# Patient Record
Sex: Male | Born: 1955 | Race: White | Hispanic: No | Marital: Single | Smoking: Current every day smoker
Health system: Southern US, Community
[De-identification: ages and names within clinical notes are randomized; demographics above are authoritative.]

## PROBLEM LIST (undated history)

## (undated) DIAGNOSIS — J45909 Unspecified asthma, uncomplicated: Secondary | ICD-10-CM

## (undated) DIAGNOSIS — J449 Chronic obstructive pulmonary disease, unspecified: Secondary | ICD-10-CM

## (undated) DIAGNOSIS — I509 Heart failure, unspecified: Secondary | ICD-10-CM

## (undated) DIAGNOSIS — M1990 Unspecified osteoarthritis, unspecified site: Secondary | ICD-10-CM

## (undated) DIAGNOSIS — I10 Essential (primary) hypertension: Secondary | ICD-10-CM

## (undated) DIAGNOSIS — R0600 Dyspnea, unspecified: Secondary | ICD-10-CM

## (undated) HISTORY — PX: HERNIA REPAIR: SHX51

---

## 2005-04-07 ENCOUNTER — Ambulatory Visit: Payer: Self-pay | Admitting: Internal Medicine

## 2007-10-28 ENCOUNTER — Ambulatory Visit: Payer: Self-pay | Admitting: Internal Medicine

## 2008-09-25 ENCOUNTER — Emergency Department: Payer: Self-pay | Admitting: Unknown Physician Specialty

## 2008-09-28 ENCOUNTER — Emergency Department: Payer: Self-pay | Admitting: Unknown Physician Specialty

## 2008-10-02 ENCOUNTER — Ambulatory Visit: Payer: Self-pay | Admitting: Internal Medicine

## 2016-03-24 ENCOUNTER — Encounter: Payer: Self-pay | Admitting: *Deleted

## 2016-03-24 ENCOUNTER — Emergency Department: Payer: Self-pay

## 2016-03-24 ENCOUNTER — Observation Stay
Admission: EM | Admit: 2016-03-24 | Discharge: 2016-03-26 | Disposition: A | Discharge status: Home / Self Care | Payer: Self-pay | Attending: Internal Medicine | Admitting: Internal Medicine

## 2016-03-24 DIAGNOSIS — I129 Hypertensive chronic kidney disease with stage 1 through stage 4 chronic kidney disease, or unspecified chronic kidney disease: Secondary | ICD-10-CM | POA: Insufficient documentation

## 2016-03-24 DIAGNOSIS — Z79899 Other long term (current) drug therapy: Secondary | ICD-10-CM | POA: Insufficient documentation

## 2016-03-24 DIAGNOSIS — A039 Shigellosis, unspecified: Principal | ICD-10-CM | POA: Insufficient documentation

## 2016-03-24 DIAGNOSIS — N183 Chronic kidney disease, stage 3 (moderate): Secondary | ICD-10-CM | POA: Insufficient documentation

## 2016-03-24 DIAGNOSIS — E860 Dehydration: Secondary | ICD-10-CM | POA: Diagnosis present

## 2016-03-24 DIAGNOSIS — E876 Hypokalemia: Secondary | ICD-10-CM | POA: Diagnosis present

## 2016-03-24 DIAGNOSIS — J449 Chronic obstructive pulmonary disease, unspecified: Secondary | ICD-10-CM | POA: Diagnosis present

## 2016-03-24 DIAGNOSIS — I10 Essential (primary) hypertension: Secondary | ICD-10-CM | POA: Diagnosis present

## 2016-03-24 DIAGNOSIS — Z7951 Long term (current) use of inhaled steroids: Secondary | ICD-10-CM | POA: Insufficient documentation

## 2016-03-24 DIAGNOSIS — R197 Diarrhea, unspecified: Secondary | ICD-10-CM | POA: Diagnosis present

## 2016-03-24 DIAGNOSIS — R509 Fever, unspecified: Secondary | ICD-10-CM

## 2016-03-24 DIAGNOSIS — E873 Alkalosis: Secondary | ICD-10-CM | POA: Insufficient documentation

## 2016-03-24 DIAGNOSIS — F17200 Nicotine dependence, unspecified, uncomplicated: Secondary | ICD-10-CM | POA: Insufficient documentation

## 2016-03-24 HISTORY — DX: Essential (primary) hypertension: I10

## 2016-03-24 HISTORY — DX: Chronic obstructive pulmonary disease, unspecified: J44.9

## 2016-03-24 HISTORY — DX: Unspecified asthma, uncomplicated: J45.909

## 2016-03-24 LAB — COMPREHENSIVE METABOLIC PANEL
ALT: 12 U/L — AB (ref 17–63)
AST: 22 U/L (ref 15–41)
Albumin: 3.5 g/dL (ref 3.5–5.0)
Alkaline Phosphatase: 117 U/L (ref 38–126)
Anion gap: 8 (ref 5–15)
BUN: 9 mg/dL (ref 6–20)
CHLORIDE: 92 mmol/L — AB (ref 101–111)
CO2: 34 mmol/L — AB (ref 22–32)
CREATININE: 1.58 mg/dL — AB (ref 0.61–1.24)
Calcium: 9.2 mg/dL (ref 8.9–10.3)
GFR calc Af Amer: 54 mL/min — ABNORMAL LOW (ref >60)
GFR, EST NON AFRICAN AMERICAN: 47 mL/min — AB (ref >60)
GLUCOSE: 112 mg/dL — AB (ref 65–99)
Potassium: 2.4 mmol/L — CL (ref 3.5–5.1)
Sodium: 134 mmol/L — ABNORMAL LOW (ref 135–145)
Total Bilirubin: 1 mg/dL (ref 0.3–1.2)
Total Protein: 7.8 g/dL (ref 6.5–8.1)

## 2016-03-24 LAB — CBC
HCT: 45.9 % (ref 40.0–52.0)
Hemoglobin: 15.8 g/dL (ref 13.0–18.0)
MCH: 36.9 pg — AB (ref 26.0–34.0)
MCHC: 34.4 g/dL (ref 32.0–36.0)
MCV: 107.2 fL — AB (ref 80.0–100.0)
PLATELETS: 277 10*3/uL (ref 150–440)
RBC: 4.28 MIL/uL — ABNORMAL LOW (ref 4.40–5.90)
RDW: 14.3 % (ref 11.5–14.5)
WBC: 10.5 10*3/uL (ref 3.8–10.6)

## 2016-03-24 LAB — URINALYSIS COMPLETE WITH MICROSCOPIC (ARMC ONLY)
BILIRUBIN URINE: NEGATIVE
GLUCOSE, UA: NEGATIVE mg/dL
Ketones, ur: NEGATIVE mg/dL
Nitrite: NEGATIVE
Protein, ur: 30 mg/dL — AB
Specific Gravity, Urine: 1.014 (ref 1.005–1.030)
pH: 6 (ref 5.0–8.0)

## 2016-03-24 LAB — LACTIC ACID, PLASMA: LACTIC ACID, VENOUS: 1.2 mmol/L (ref 0.5–1.9)

## 2016-03-24 LAB — LIPASE, BLOOD: LIPASE: 17 U/L (ref 11–51)

## 2016-03-24 MED ORDER — POTASSIUM CHLORIDE IN NACL 20-0.9 MEQ/L-% IV SOLN
Freq: Once | INTRAVENOUS | Status: AC
  Administered 2016-03-24: 23:00:00 via INTRAVENOUS
  Filled 2016-03-24: qty 1000

## 2016-03-24 MED ORDER — ACETAMINOPHEN 325 MG PO TABS
650.0000 mg | ORAL_TABLET | Freq: Once | ORAL | Status: AC | PRN
  Administered 2016-03-24: 650 mg via ORAL

## 2016-03-24 MED ORDER — IBUPROFEN 600 MG PO TABS
ORAL_TABLET | ORAL | Status: AC
  Filled 2016-03-24: qty 1

## 2016-03-24 MED ORDER — METRONIDAZOLE IN NACL 5-0.79 MG/ML-% IV SOLN
500.0000 mg | Freq: Once | INTRAVENOUS | Status: AC
  Administered 2016-03-25: 500 mg via INTRAVENOUS
  Filled 2016-03-24: qty 100

## 2016-03-24 MED ORDER — ONDANSETRON 4 MG PO TBDP
4.0000 mg | ORAL_TABLET | Freq: Once | ORAL | Status: AC | PRN
  Administered 2016-03-24: 4 mg via ORAL

## 2016-03-24 MED ORDER — IPRATROPIUM-ALBUTEROL 0.5-2.5 (3) MG/3ML IN SOLN
3.0000 mL | Freq: Once | RESPIRATORY_TRACT | Status: AC
  Administered 2016-03-24: 3 mL via RESPIRATORY_TRACT
  Filled 2016-03-24: qty 3

## 2016-03-24 MED ORDER — POTASSIUM CHLORIDE CRYS ER 20 MEQ PO TBCR
40.0000 meq | EXTENDED_RELEASE_TABLET | Freq: Once | ORAL | Status: AC
  Administered 2016-03-24: 40 meq via ORAL
  Filled 2016-03-24: qty 2

## 2016-03-24 MED ORDER — IBUPROFEN 600 MG PO TABS
600.0000 mg | ORAL_TABLET | Freq: Once | ORAL | Status: AC
  Administered 2016-03-24: 600 mg via ORAL

## 2016-03-24 MED ORDER — ACETAMINOPHEN 325 MG PO TABS
ORAL_TABLET | ORAL | Status: AC
  Administered 2016-03-24: 650 mg via ORAL
  Filled 2016-03-24: qty 2

## 2016-03-24 MED ORDER — SODIUM CHLORIDE 0.9 % IV BOLUS (SEPSIS)
1000.0000 mL | Freq: Once | INTRAVENOUS | Status: AC
  Administered 2016-03-24: 1000 mL via INTRAVENOUS

## 2016-03-24 MED ORDER — POTASSIUM CHLORIDE 10 MEQ/100ML IV SOLN
10.0000 meq | INTRAVENOUS | Status: AC
Start: 2016-03-25 — End: 2016-03-25
  Administered 2016-03-25 (×2): 10 meq via INTRAVENOUS
  Filled 2016-03-24 (×3): qty 100

## 2016-03-24 MED ORDER — ONDANSETRON 4 MG PO TBDP
ORAL_TABLET | ORAL | Status: AC
  Administered 2016-03-24: 4 mg via ORAL
  Filled 2016-03-24: qty 1

## 2016-03-24 NOTE — ED Provider Notes (Signed)
[facility] Emergency Department Provider Note    First MD Initiated Contact with Patient 03/24/16 2234     (approximate)  I have reviewed the triage vital signs and the nursing notes.   HISTORY  Chief Complaint Diarrhea    HPI Miguel Howell is a 60 y.o. male with history of asthma and COPD presents with several days of diarrhea, nausea fatigue and weakness. Patient states symptoms started on Tuesday. He just recently finished course of amoxicillin status post dental surgery. States that he's having roughly 5 episodes of diarrhea per day. Denies any blood in his stools. Denies any melena. States an episode of epigastric pain yesterday but denies any pain at this time. Denies any history of IVDA. No recent surgeries. Denies any known sick contacts.   Past Medical History:  Diagnosis Date  . Asthma   . COPD (chronic obstructive pulmonary disease) (HCC)   . Hypertension     There are no active problems to display for this patient.   Past Surgical History:  Procedure Laterality Date  . HERNIA REPAIR      Prior to Admission medications   Not on File    Allergies Review of patient's allergies indicates no known allergies.  No fam h/o easy bleeding or bruising  Social History Social History  Substance Use Topics  . Smoking status: Current Some Day Smoker  . Smokeless tobacco: Current User  . Alcohol use Yes    Review of Systems Patient denies headaches, rhinorrhea, blurry vision, numbness, shortness of breath, chest pain, edema, cough, abdominal pain, nausea, vomiting, diarrhea, dysuria, fevers, rashes or hallucinations unless otherwise stated above in HPI. ____________________________________________   PHYSICAL EXAM:  VITAL SIGNS: Vitals:   03/24/16 2136 03/24/16 2238  BP: 131/62 107/66  Pulse: 95 87  Resp: 19 17  Temp: (!) 103 F (39.4 C) (!) 100.8 F (38.2 C)    Constitutional: Alert and oriented. Ill appearing Eyes:  Conjunctivae are normal. PERRL. EOMI. Head: Atraumatic. Nose: No congestion/rhinnorhea. Mouth/Throat: Mucous membranes are dry.  Oropharynx non-erythematous. Neck: No stridor. Painless ROM. No cervical spine tenderness to palpation Hematological/Lymphatic/Immunilogical: No cervical lymphadenopathy. Cardiovascular: Normal rate, regular rhythm. Grossly normal heart sounds.  Good peripheral circulation. Respiratory: Normal respiratory effort.  No retractions. Lungs coarse diffuse wheezing throughout Gastrointestinal: Soft and nontender. No distention. No abdominal bruits. No CVA tenderness. Musculoskeletal: No lower extremity tenderness nor edema.  No joint effusions. Neurologic:  Normal speech and language. No gross focal neurologic deficits are appreciated. No gait instability. Skin:  Skin is warm, dry and intact. No rash noted. Psychiatric: Mood and affect are normal. Speech and behavior are normal.  ____________________________________________   LABS (all labs ordered are listed, but only abnormal results are displayed)  Results for orders placed or performed during the hospital encounter of 03/24/16 (from the past 24 hour(s))  Lipase, blood     Status: None   Collection Time: 03/24/16  8:25 PM  Result Value Ref Range   Lipase 17 11 - 51 U/L  Comprehensive metabolic panel     Status: Abnormal   Collection Time: 03/24/16  8:25 PM  Result Value Ref Range   Sodium 134 (L) 135 - 145 mmol/L   Potassium 2.4 (LL) 3.5 - 5.1 mmol/L   Chloride 92 (L) 101 - 111 mmol/L   CO2 34 (H) 22 - 32 mmol/L   Glucose, Bld 112 (H) 65 - 99 mg/dL   BUN 9 6 - 20 mg/dL   Creatinine, Ser 1.61 (  H) 0.61 - 1.24 mg/dL   Calcium 9.2 8.9 - 40.110.3 mg/dL   Total Protein 7.8 6.5 - 8.1 g/dL   Albumin 3.5 3.5 - 5.0 g/dL   AST 22 15 - 41 U/L   ALT 12 (L) 17 - 63 U/L   Alkaline Phosphatase 117 38 - 126 U/L   Total Bilirubin 1.0 0.3 - 1.2 mg/dL   GFR calc non Af Amer 47 (L) >60 mL/min   GFR calc Af Amer 54 (L) >60  mL/min   Anion gap 8 5 - 15  CBC     Status: Abnormal   Collection Time: 03/24/16  8:25 PM  Result Value Ref Range   WBC 10.5 3.8 - 10.6 K/uL   RBC 4.28 (L) 4.40 - 5.90 MIL/uL   Hemoglobin 15.8 13.0 - 18.0 g/dL   HCT 02.745.9 25.340.0 - 66.452.0 %   MCV 107.2 (H) 80.0 - 100.0 fL   MCH 36.9 (H) 26.0 - 34.0 pg   MCHC 34.4 32.0 - 36.0 g/dL   RDW 40.314.3 47.411.5 - 25.914.5 %   Platelets 277 150 - 440 K/uL  Urinalysis complete, with microscopic     Status: Abnormal   Collection Time: 03/24/16  8:25 PM  Result Value Ref Range   Color, Urine YELLOW (A) YELLOW   APPearance CLEAR (A) CLEAR   Glucose, UA NEGATIVE NEGATIVE mg/dL   Bilirubin Urine NEGATIVE NEGATIVE   Ketones, ur NEGATIVE NEGATIVE mg/dL   Specific Gravity, Urine 1.014 1.005 - 1.030   Hgb urine dipstick 1+ (A) NEGATIVE   pH 6.0 5.0 - 8.0   Protein, ur 30 (A) NEGATIVE mg/dL   Nitrite NEGATIVE NEGATIVE   Leukocytes, UA TRACE (A) NEGATIVE   RBC / HPF 0-5 0 - 5 RBC/hpf   WBC, UA 0-5 0 - 5 WBC/hpf   Bacteria, UA RARE (A) NONE SEEN   Squamous Epithelial / LPF 0-5 (A) NONE SEEN   Mucous PRESENT   Lactic acid, plasma     Status: None   Collection Time: 03/24/16 10:28 PM  Result Value Ref Range   Lactic Acid, Venous 1.2 0.5 - 1.9 mmol/L   ____________________________________________   ____________________________________________  RADIOLOGY  I personally reviewed all radiographic images ordered to evaluate for the above acute complaints and reviewed radiology reports and findings.  These findings were personally discussed with the patient.  Please see medical record for radiology report.  ____________________________________________   PROCEDURES  Procedure(s) performed: none    Critical Care performed: no ____________________________________________   INITIAL IMPRESSION / ASSESSMENT AND PLAN / ED COURSE  Pertinent labs & imaging results that were available during my care of the patient were reviewed by me and considered in my medical  decision making (see chart for details).  DDX: flu, infectious diarrhea, pna, dehydration  Miguel Howell is a 60 y.o. who presents to the ED with Several days of flulike illness with watery diarrhea. Patient does appear acutely ill with fever and tachycardia. Also with evidence of COPD exacerbation with mild tachypnea and diffuse wheezing. Chest x-ray without evidence of focal pneumonia. Patient does have evidence of hypokalemia and metabolic alkalosis likely secondary to GI losses. His abdominal exam is soft and benign. Do not feel CT imaging clinically indicated at this time. Potassium is repleted with IV and oral potassium.  The patient will be placed on continuous pulse oximetry and telemetry for monitoring.  Laboratory evaluation will be sent to evaluate for the above complaints.   Stool studies and infectious workup  ordered. Based on his hypokalemia and tachycardia with GI losses do feel patient will require admission for IV resuscitation. We'll hold off on antibiotics until culture data are turns as his presentation is likely viral in etiology.  Have discussed with the patient and available family all diagnostics and treatments performed thus far and all questions were answered to the best of my ability. The patient demonstrates understanding and agreement with plan.   Clinical Course     ____________________________________________   FINAL CLINICAL IMPRESSION(S) / ED DIAGNOSES  Final diagnoses:  Diarrhea of presumed infectious origin  Hypokalemia  Metabolic alkalosis  Fever, unspecified fever cause      NEW MEDICATIONS STARTED DURING THIS VISIT:  New Prescriptions   No medications on file     Note:  This document was prepared using Dragon voice recognition software and may include unintentional dictation errors.    Willy Eddy, MD 03/24/16 [phone]

## 2016-03-24 NOTE — ED Triage Notes (Signed)
Pt states diarrhea since Tuesday associated with chills and nausea. Has been taking dayquil and nyquil. No meds PTA.

## 2016-03-24 NOTE — ED Notes (Signed)
Pt c/o diarrhea, nausea, fatigue, weakness, anorexia, headache beginning Tuesday. Pt reports having dental surgery on 10/9, and being prescribed amoxicillan post-op. Pt's last dose of antibiotic was 10/17. Pt had approx 5 episodes of diarrhea in the last 24 hours.

## 2016-03-25 DIAGNOSIS — R197 Diarrhea, unspecified: Secondary | ICD-10-CM | POA: Diagnosis present

## 2016-03-25 DIAGNOSIS — E860 Dehydration: Secondary | ICD-10-CM | POA: Diagnosis present

## 2016-03-25 DIAGNOSIS — I10 Essential (primary) hypertension: Secondary | ICD-10-CM | POA: Diagnosis present

## 2016-03-25 DIAGNOSIS — J449 Chronic obstructive pulmonary disease, unspecified: Secondary | ICD-10-CM | POA: Diagnosis present

## 2016-03-25 DIAGNOSIS — E876 Hypokalemia: Secondary | ICD-10-CM | POA: Diagnosis present

## 2016-03-25 LAB — GASTROINTESTINAL PANEL BY PCR, STOOL (REPLACES STOOL CULTURE)

## 2016-03-25 LAB — INFLUENZA PANEL BY PCR (TYPE A & B)
H1N1 flu by pcr: NOT DETECTED
Influenza A By PCR: NEGATIVE
Influenza B By PCR: NEGATIVE

## 2016-03-25 LAB — BASIC METABOLIC PANEL
Anion gap: 7 (ref 5–15)
BUN: 9 mg/dL (ref 6–20)
CO2: 31 mmol/L (ref 22–32)
CREATININE: 1.61 mg/dL — AB (ref 0.61–1.24)
Calcium: 8.1 mg/dL — ABNORMAL LOW (ref 8.9–10.3)
Chloride: 98 mmol/L — ABNORMAL LOW (ref 101–111)
GFR calc Af Amer: 53 mL/min — ABNORMAL LOW (ref >60)
GFR, EST NON AFRICAN AMERICAN: 46 mL/min — AB (ref >60)
Glucose, Bld: 111 mg/dL — ABNORMAL HIGH (ref 65–99)
POTASSIUM: 3.2 mmol/L — AB (ref 3.5–5.1)
SODIUM: 136 mmol/L (ref 135–145)

## 2016-03-25 LAB — CBC
HEMATOCRIT: 37.5 % — AB (ref 40.0–52.0)
Hemoglobin: 13.1 g/dL (ref 13.0–18.0)
MCH: 37.1 pg — ABNORMAL HIGH (ref 26.0–34.0)
MCHC: 35 g/dL (ref 32.0–36.0)
MCV: 106 fL — ABNORMAL HIGH (ref 80.0–100.0)
PLATELETS: 216 10*3/uL (ref 150–440)
RBC: 3.54 MIL/uL — ABNORMAL LOW (ref 4.40–5.90)
RDW: 14.3 % (ref 11.5–14.5)
WBC: 9.3 10*3/uL (ref 3.8–10.6)

## 2016-03-25 LAB — C DIFFICILE QUICK SCREEN W PCR REFLEX
C DIFFICILE (CDIFF) INTERP: NOT DETECTED
C DIFFICILE (CDIFF) TOXIN: NEGATIVE
C Diff antigen: NEGATIVE

## 2016-03-25 MED ORDER — LISINOPRIL 20 MG PO TABS
20.0000 mg | ORAL_TABLET | Freq: Every day | ORAL | Status: DC
  Administered 2016-03-25 – 2016-03-26 (×2): 20 mg via ORAL
  Filled 2016-03-25 (×2): qty 1

## 2016-03-25 MED ORDER — MONTELUKAST SODIUM 10 MG PO TABS
10.0000 mg | ORAL_TABLET | Freq: Every day | ORAL | Status: DC
  Administered 2016-03-25 – 2016-03-26 (×2): 10 mg via ORAL
  Filled 2016-03-25 (×2): qty 1

## 2016-03-25 MED ORDER — ACETAMINOPHEN 650 MG RE SUPP: 650.0000 mg | Freq: Four times a day (QID) | RECTAL | Status: DC | PRN

## 2016-03-25 MED ORDER — CARVEDILOL 6.25 MG PO TABS
6.2500 mg | ORAL_TABLET | Freq: Two times a day (BID) | ORAL | Status: DC
  Administered 2016-03-25 – 2016-03-26 (×3): 6.25 mg via ORAL
  Filled 2016-03-25 (×3): qty 1

## 2016-03-25 MED ORDER — CIPROFLOXACIN IN D5W 400 MG/200ML IV SOLN
400.0000 mg | Freq: Two times a day (BID) | INTRAVENOUS | Status: DC
  Administered 2016-03-25: 400 mg via INTRAVENOUS
  Filled 2016-03-25 (×3): qty 200

## 2016-03-25 MED ORDER — POTASSIUM CHLORIDE 10 MEQ/100ML IV SOLN
10.0000 meq | Freq: Once | INTRAVENOUS | Status: AC
  Administered 2016-03-25: 03:00:00 10 meq via INTRAVENOUS

## 2016-03-25 MED ORDER — IPRATROPIUM-ALBUTEROL 0.5-2.5 (3) MG/3ML IN SOLN: 3.0000 mL | RESPIRATORY_TRACT | Status: DC | PRN

## 2016-03-25 MED ORDER — ONDANSETRON HCL 4 MG PO TABS: 4.0000 mg | ORAL_TABLET | Freq: Four times a day (QID) | ORAL | Status: DC | PRN

## 2016-03-25 MED ORDER — FAMOTIDINE 20 MG PO TABS
20.0000 mg | ORAL_TABLET | Freq: Every day | ORAL | Status: DC
  Administered 2016-03-25 – 2016-03-26 (×2): 20 mg via ORAL
  Filled 2016-03-25 (×2): qty 1

## 2016-03-25 MED ORDER — TIOTROPIUM BROMIDE MONOHYDRATE 18 MCG IN CAPS
1.0000 | ORAL_CAPSULE | Freq: Every day | RESPIRATORY_TRACT | Status: DC
  Filled 2016-03-25: qty 5

## 2016-03-25 MED ORDER — SODIUM CHLORIDE 0.9 % IV SOLN
INTRAVENOUS | Status: AC
  Administered 2016-03-25: 08:00:00 via INTRAVENOUS

## 2016-03-25 MED ORDER — ENOXAPARIN SODIUM 40 MG/0.4ML ~~LOC~~ SOLN
40.0000 mg | SUBCUTANEOUS | Status: DC
  Administered 2016-03-25: 21:00:00 40 mg via SUBCUTANEOUS
  Filled 2016-03-25: qty 0.4

## 2016-03-25 MED ORDER — MOMETASONE FURO-FORMOTEROL FUM 200-5 MCG/ACT IN AERO
2.0000 | INHALATION_SPRAY | Freq: Two times a day (BID) | RESPIRATORY_TRACT | Status: DC
Start: 2016-03-25 — End: 2016-03-26
  Administered 2016-03-25 – 2016-03-26 (×3): 2 via RESPIRATORY_TRACT
  Filled 2016-03-25: qty 8.8

## 2016-03-25 MED ORDER — ONDANSETRON HCL 4 MG/2ML IJ SOLN: 4.0000 mg | Freq: Four times a day (QID) | INTRAMUSCULAR | Status: DC | PRN

## 2016-03-25 MED ORDER — ACETAMINOPHEN 325 MG PO TABS: 650.0000 mg | ORAL_TABLET | Freq: Four times a day (QID) | ORAL | Status: DC | PRN

## 2016-03-25 NOTE — ED Notes (Signed)
Pt transported to room 119 

## 2016-03-25 NOTE — Progress Notes (Signed)
C. Diff stool test negative. Enteric precautions discontinued.

## 2016-03-25 NOTE — H&P (Signed)
[facility] [place] [facility]   PATIENT NAME: Miguel ResidesDaniel Howell    MR#:  [id number]  DATE OF BIRTH:  1955-12-09  DATE OF ADMISSION:  03/24/2016  PRIMARY CARE PHYSICIAN: No PCP Per Patient   REQUESTING/REFERRING PHYSICIAN: Roxan Hockeyobinson, MD  CHIEF COMPLAINT:   Chief Complaint  Patient presents with  . Diarrhea    HISTORY OF PRESENT ILLNESS:  Miguel Howell  is a 60 y.o. male who presents with Persistent diarrhea for the last 4 days. Patient states that his symptoms began initially 4 days ago, when he had abdominal cramping and pain, as well as diarrhea. His cramping and pain improved but the diarrhea persisted. He's had decreased appetite as well during this time. Patient states he had and the procedure done shortly before his symptoms began and was on amoxicillin after the procedure. He finished his course of amoxicillin 4 days ago. In the ED he was found to be hypokalemic and dehydrated. Workup was otherwise largely negative. Hospitalists were called for admission for IV hydration and further evaluation.  PAST MEDICAL HISTORY:   Past Medical History:  Diagnosis Date  . Asthma   . COPD (chronic obstructive pulmonary disease) (HCC)   . Hypertension     PAST SURGICAL HISTORY:   Past Surgical History:  Procedure Laterality Date  . HERNIA REPAIR      SOCIAL HISTORY:   Social History  Substance Use Topics  . Smoking status: Current Some Day Smoker  . Smokeless tobacco: Current User  . Alcohol use Yes    FAMILY HISTORY:  No family history on file.  DRUG ALLERGIES:  No Known Allergies  MEDICATIONS AT HOME:   Prior to Admission medications   Medication Sig Start Date End Date Taking? Authorizing Provider  carvedilol (COREG) 6.25 MG tablet Take 6.25 mg by mouth 2 (two) times daily. 02/17/16  Yes Historical Provider, MD  fexofenadine (ALLEGRA) 180 MG tablet Take 180 mg by mouth daily.   Yes Historical Provider, MD  furosemide (LASIX) 40 MG tablet  Take 40 mg by mouth 2 (two) times daily. 02/24/16  Yes Historical Provider, MD  lisinopril (PRINIVIL,ZESTRIL) 20 MG tablet Take 20 mg by mouth daily. 01/28/16  Yes Historical Provider, MD  montelukast (SINGULAIR) 10 MG tablet Take 10 mg by mouth daily. 03/08/16  Yes Historical Provider, MD  ranitidine (ZANTAC) 150 MG tablet Take 150 mg by mouth daily.   Yes Historical Provider, MD  SPIRIVA HANDIHALER 18 MCG inhalation capsule Place 1 capsule into inhaler and inhale daily. 03/09/16  Yes Historical Provider, MD  SYMBICORT 160-4.5 MCG/ACT inhaler Inhale 2 puffs into the lungs 2 (two) times daily. 02/07/16  Yes Historical Provider, MD  LORazepam (ATIVAN) 1 MG tablet Take 1 mg by mouth at bedtime as needed for sleep. 02/17/16   Historical Provider, MD  PROAIR HFA 108 (90 Base) MCG/ACT inhaler Inhale 2 puffs into the lungs every 4 (four) hours as needed for shortness of breath. 03/05/16   Historical Provider, MD    REVIEW OF SYSTEMS:  Review of Systems  Constitutional: Negative for chills, fever, malaise/fatigue and weight loss.  HENT: Negative for ear pain, hearing loss and tinnitus.   Eyes: Negative for blurred vision, double vision, pain and redness.  Respiratory: Negative for cough, hemoptysis and shortness of breath.   Cardiovascular: Negative for chest pain, palpitations, orthopnea and leg swelling.  Gastrointestinal: Positive for abdominal pain and diarrhea. Negative for constipation, nausea and vomiting.  Genitourinary: Negative for dysuria, frequency and hematuria.  Musculoskeletal: Negative for back pain, joint pain and neck pain.  Skin:       No acne, rash, or lesions  Neurological: Negative for dizziness, tremors, focal weakness and weakness.  Endo/Heme/Allergies: Negative for polydipsia. Does not bruise/bleed easily.  Psychiatric/Behavioral: Negative for depression. The patient is not nervous/anxious and does not have insomnia.      VITAL SIGNS:   Vitals:   03/24/16 2020 03/24/16 2136  03/24/16 2238  BP: (!) 161/69 131/62 107/66  Pulse: (!) 102 95 87  Resp: 18 19 17   Temp: (!) 103.2 F (39.6 C) (!) 103 F (39.4 C) (!) 100.8 F (38.2 C)  TempSrc: Oral Oral Oral  SpO2: 99% 93% 93%  Weight: 104.3 kg (230 lb)    Height: 5\' 9"  (1.753 m)     Wt Readings from Last 3 Encounters:  03/24/16 104.3 kg (230 lb)    PHYSICAL EXAMINATION:  Physical Exam  Vitals reviewed. Constitutional: He is oriented to person, place, and time. He appears well-developed and well-nourished. No distress.  HENT:  Head: Normocephalic and atraumatic.  Dry mucous membranes  Eyes: Conjunctivae and EOM are normal. Pupils are equal, round, and reactive to light. No scleral icterus.  Neck: Normal range of motion. Neck supple. No JVD present. No thyromegaly present.  Cardiovascular: Normal rate, regular rhythm and intact distal pulses.  Exam reveals no gallop and no friction rub.   No murmur heard. Respiratory: Effort normal and breath sounds normal. No respiratory distress. He has no wheezes. He has no rales.  GI: Soft. Bowel sounds are normal. He exhibits no distension. There is no tenderness.  Musculoskeletal: Normal range of motion. He exhibits no edema.  No arthritis, no gout  Lymphadenopathy:    He has no cervical adenopathy.  Neurological: He is alert and oriented to person, place, and time. No cranial nerve deficit.  No dysarthria, no aphasia  Skin: Skin is warm and dry. No rash noted. No erythema.  Psychiatric: He has a normal mood and affect. His behavior is normal. Judgment and thought content normal.    LABORATORY PANEL:   CBC  Recent Labs Lab 03/24/16 2025  WBC 10.5  HGB 15.8  HCT 45.9  PLT 277   ------------------------------------------------------------------------------------------------------------------  Chemistries   Recent Labs Lab 03/24/16 2025  NA 134*  K 2.4*  CL 92*  CO2 34*  GLUCOSE 112*  BUN 9  CREATININE 1.58*  CALCIUM 9.2  AST 22  ALT 12*   ALKPHOS 117  BILITOT 1.0   ------------------------------------------------------------------------------------------------------------------  Cardiac Enzymes No results for input(s): TROPONINI in the last 168 hours. ------------------------------------------------------------------------------------------------------------------  RADIOLOGY:  Dg Chest 2 View  Result Date: 03/24/2016 CLINICAL DATA:  Fever and diarrhea for 3 days.  Occasional cough. EXAM: CHEST  2 VIEW COMPARISON:  09/26/2008 chest radiograph FINDINGS: The heart size and mediastinal contours are within normal limits. Both lungs are clear. Small anterior osteophytes noted along the lower thoracic spine. No acute osseous abnormality. Small summation density at the right costophrenic angle likely related to atelectasis superimposed on ribs. IMPRESSION: No active cardiopulmonary disease. Electronically Signed   By: Tollie Eth M.D.   On: 03/24/2016 23:17    EKG:  No orders found for this or any previous visit.  IMPRESSION AND PLAN:  Principal Problem:   Dehydration - Secondary to his diarrhea. We will hydrate him with IV fluids tonight. Despite his dehydration and his renal function is at his baseline, though he does have baseline CKD stage III. Active Problems:  Diarrhea - unclear etiology at this time, though suspect it is possibly related to recent antibiotic use versus viral etiology. C. difficile and stool studies ordered in the ED, the patient has not had another bowel movement. These will be sent once he does.   Hypokalemia - IV and by mouth repletion ordered in the ED, we will monitor and replete as necessary   HTN (hypertension) - continue home meds   COPD (chronic obstructive pulmonary disease) (HCC) - continue home meds with when necessary nebs here  All the records are reviewed and case discussed with ED provider. Management plans discussed with the patient and/or family.  DVT PROPHYLAXIS: SubQ lovenox  GI  PROPHYLAXIS: H2 blocker  ADMISSION STATUS: Observation  CODE STATUS: Full Code Status History    This patient does not have a recorded code status. Please follow your organizational policy for patients in this situation.      TOTAL TIME TAKING CARE OF THIS PATIENT: 40 minutes.    Miguel Howell FIELDING 03/25/2016, 1:32 AM  [facility]  Office  [phone]  CC: Primary care physician; No PCP Per Patient

## 2016-03-25 NOTE — Progress Notes (Signed)
[facility] -  at [facility]                                                                                                                                                                                            Patient Demographics   Miguel Howell, is a 60 y.o. male, DOB - 1956/05/27, VHQ:[id number]  Admit date - 03/24/2016   Admitting Physician Oralia Manis, MD  Outpatient Primary MD for the patient is No PCP Per Patient   LOS - 0  Subjective: Patient continues to have diarrhea with some improvement able to eat some now Denies any chest pain or palpitations   Review of Systems:   CONSTITUTIONAL: No documented fever. No fatigue, weakness. No weight gain, no weight loss.  EYES: No blurry or double vision.  ENT: No tinnitus. No postnasal drip. No redness of the oropharynx.  RESPIRATORY: No cough, no wheeze, no hemoptysis. No dyspnea.  CARDIOVASCULAR: No chest pain. No orthopnea. No palpitations. No syncope.  GASTROINTESTINAL: No nausea, no vomiting orPositive diarrhea. No abdominal pain. No melena or hematochezia.  GENITOURINARY: No dysuria or hematuria.  ENDOCRINE: No polyuria or nocturia. No heat or cold intolerance.  HEMATOLOGY: No anemia. No bruising. No bleeding.  INTEGUMENTARY: No rashes. No lesions.  MUSCULOSKELETAL: No arthritis. No swelling. No gout.  NEUROLOGIC: No numbness, tingling, or ataxia. No seizure-type activity.  PSYCHIATRIC: No anxiety. No insomnia. No ADD.    Vitals:   Vitals:   03/25/16 0130 03/25/16 0253 03/25/16 0437 03/25/16 0815  BP: 115/65 110/64 (!) 112/59   Pulse: 89 84 83   Resp: 17 18 17    Temp: (!) 100.4 F (38 C) 99.5 F (37.5 C) 99.5 F (37.5 C)   TempSrc: Oral Oral Oral   SpO2: 94% 94% 98% 96%  Weight:  206 lb 14.4 oz (93.8 kg)    Height:  5\' 9"  (1.753 m)      Wt Readings from Last 3 Encounters:  03/25/16 206 lb 14.4 oz (93.8 kg)     Intake/Output Summary (Last 24 hours) at 03/25/16 1213 Last  data filed at 03/25/16 0328  Gross per 24 hour  Intake              200 ml  Output                0 ml  Net              200 ml    Physical Exam:   GENERAL: Pleasant-appearing in no apparent distress.  HEAD, EYES, EARS, NOSE AND THROAT: Atraumatic, normocephalic. Extraocular muscles are intact. Pupils equal and  reactive to light. Sclerae anicteric. No conjunctival injection. No oro-pharyngeal erythema.  NECK: Supple. There is no jugular venous distention. No bruits, no lymphadenopathy, no thyromegaly.  HEART: Regular rate and rhythm,. No murmurs, no rubs, no clicks.  LUNGS: Clear to auscultation bilaterally. No rales or rhonchi. No wheezes.  ABDOMEN: Soft, flat, nontender, nondistended. Has good bowel sounds. No hepatosplenomegaly appreciated.  EXTREMITIES: No evidence of any cyanosis, clubbing, or peripheral edema.  +2 pedal and radial pulses bilaterally.  NEUROLOGIC: The patient is alert, awake, and oriented x3 with no focal motor or sensory deficits appreciated bilaterally.  SKIN: Moist and warm with no rashes appreciated.  Psych: Not anxious, depressed LN: No inguinal LN enlargement    Antibiotics   Anti-infectives    Start     Dose/Rate Route Frequency Ordered Stop   03/25/16 0000  metroNIDAZOLE (FLAGYL) IVPB 500 mg     500 mg 100 mL/hr over 60 Minutes Intravenous  Once 03/24/16 2358 03/25/16 0117      Medications   Scheduled Meds: . carvedilol  6.25 mg Oral BID  . enoxaparin (LOVENOX) injection  40 mg Subcutaneous Q24H  . famotidine  20 mg Oral Daily  . lisinopril  20 mg Oral Daily  . mometasone-formoterol  2 puff Inhalation BID  . montelukast  10 mg Oral Daily  . tiotropium  1 capsule Inhalation Daily   Continuous Infusions: . sodium chloride 100 mL/hr at 03/25/16 0819   PRN Meds:.acetaminophen **OR** acetaminophen, ipratropium-albuterol, ondansetron **OR** ondansetron (ZOFRAN) IV   Data Review:   Micro Results Recent Results (from the past 240 hour(s))   Blood culture (routine x 2)     Status: None (Preliminary result)   Collection Time: 03/24/16 10:28 PM  Result Value Ref Range Status   Specimen Description BLOOD RIGHT ASSIST CONTROL  Final   Special Requests BOTTLES DRAWN AEROBIC AND ANAEROBIC 15CC  Final   Culture NO GROWTH < 12 HOURS  Final   Report Status PENDING  Incomplete  Blood culture (routine x 2)     Status: None (Preliminary result)   Collection Time: 03/24/16 10:28 PM  Result Value Ref Range Status   Specimen Description BLOOD RIGHT HAND  Final   Special Requests BOTTLES DRAWN AEROBIC AND ANAEROBIC 14CC  Final   Culture NO GROWTH < 12 HOURS  Final   Report Status PENDING  Incomplete  C difficile quick scan w PCR reflex     Status: None   Collection Time: 03/25/16  8:30 AM  Result Value Ref Range Status   C Diff antigen NEGATIVE NEGATIVE Final   C Diff toxin NEGATIVE NEGATIVE Final   C Diff interpretation No C. difficile detected.  Final    Radiology Reports Dg Chest 2 View  Result Date: 03/24/2016 CLINICAL DATA:  Fever and diarrhea for 3 days.  Occasional cough. EXAM: CHEST  2 VIEW COMPARISON:  09/26/2008 chest radiograph FINDINGS: The heart size and mediastinal contours are within normal limits. Both lungs are clear. Small anterior osteophytes noted along the lower thoracic spine. No acute osseous abnormality. Small summation density at the right costophrenic angle likely related to atelectasis superimposed on ribs. IMPRESSION: No active cardiopulmonary disease. Electronically Signed   By: Tollie Eth M.D.   On: 03/24/2016 23:17     CBC  Recent Labs Lab 03/24/16 2025 03/25/16 0423  WBC 10.5 9.3  HGB 15.8 13.1  HCT 45.9 37.5*  PLT 277 216  MCV 107.2* 106.0*  MCH 36.9* 37.1*  MCHC 34.4 35.0  RDW  14.3 14.3    Chemistries   Recent Labs Lab 03/24/16 2025 03/25/16 0423  NA 134* 136  K 2.4* 3.2*  CL 92* 98*  CO2 34* 31  GLUCOSE 112* 111*  BUN 9 9  CREATININE 1.58* 1.61*  CALCIUM 9.2 8.1*  AST 22   --   ALT 12*  --   ALKPHOS 117  --   BILITOT 1.0  --    ------------------------------------------------------------------------------------------------------------------ estimated creatinine clearance is 56.5 mL/min (by C-G formula based on SCr of 1.61 mg/dL (H)). ------------------------------------------------------------------------------------------------------------------ No results for input(s): HGBA1C in the last 72 hours. ------------------------------------------------------------------------------------------------------------------ No results for input(s): CHOL, HDL, LDLCALC, TRIG, CHOLHDL, LDLDIRECT in the last 72 hours. ------------------------------------------------------------------------------------------------------------------ No results for input(s): TSH, T4TOTAL, T3FREE, THYROIDAB in the last 72 hours.  Invalid input(s): FREET3 ------------------------------------------------------------------------------------------------------------------ No results for input(s): VITAMINB12, FOLATE, FERRITIN, TIBC, IRON, RETICCTPCT in the last 72 hours.  Coagulation profile No results for input(s): INR, PROTIME in the last 168 hours.  No results for input(s): DDIMER in the last 72 hours.  Cardiac Enzymes No results for input(s): CKMB, TROPONINI, MYOGLOBIN in the last 168 hours.  Invalid input(s): CK ------------------------------------------------------------------------------------------------------------------ Invalid input(s): POCBNP    Assessment & Plan   Patient is a 60 year old male admitted with fevers and diarrhea   #1  Dehydration - Secondary to his diarrhea. Continue IV hydration  #2   Diarrhea - C. difficile has been ruled out suspect due to viral infection PCR for bacteria and viral infection pending Continue supportive care  #3 hypokalemia continued to replace potassium and IV fluids  #4 essential hypertension blood pressure currently stable continue  Coreg and lisinopril    #5   COPD (chronic obstructive pulmonary disease) (HCC) - continue home meds with when necessary nebs here no evidence of exasperation  #6 miscellaneous Lovenox for DVT prophylaxis     Code Status Orders        Start     Ordered   03/25/16 0247  Full code  Continuous     03/25/16 0246    Code Status History    Date Active Date Inactive Code Status Order ID Comments User Context   This patient has a current code status but no historical code status.           Consults  None   DVT Prophylaxis  Lovenox   Lab Results  Component Value Date   PLT 216 03/25/2016     Time Spent in minutes 45 minutes  Greater than 50% of time spent in care coordination and counseling patient regarding the condition and plan of care.   Auburn BilberryPATEL, Shaquille Murdy M.D on 03/25/2016 at 12:13 PM  Between 7am to 6pm - Pager - [phone]  After 6pm go to [email or web address] - password [email or web address]  [facility]   Office  [phone]

## 2016-03-25 NOTE — Progress Notes (Signed)
ANTIBIOTIC CONSULT NOTE - INITIAL  Pharmacy Consult for Ciprofloxacin  Indication: intra abdominal infection  No Known Allergies  Patient Measurements: Height: 5\' 9"  (175.3 cm) Weight: 206 lb 14.4 oz (93.8 kg) IBW/kg (Calculated) : 70.7 Adjusted Body Weight:   Vital Signs: Temp: 98.9 F (37.2 C) (10/21 2002) Temp Source: Oral (10/21 2002) BP: 121/67 (10/21 2002) Pulse Rate: 80 (10/21 2002) Intake/Output from previous day: 10/20 0701 - 10/21 0700 In: 200 [IV Piggyback:200] Out: -  Intake/Output from this shift: No intake/output data recorded.  Labs:  Recent Labs  03/24/16 2025 03/25/16 0423  WBC 10.5 9.3  HGB 15.8 13.1  PLT 277 216  CREATININE 1.58* 1.61*   Estimated Creatinine Clearance: 56.5 mL/min (by C-G formula based on SCr of 1.61 mg/dL (H)). No results for input(s): VANCOTROUGH, VANCOPEAK, VANCORANDOM, GENTTROUGH, GENTPEAK, GENTRANDOM, TOBRATROUGH, TOBRAPEAK, TOBRARND, AMIKACINPEAK, AMIKACINTROU, AMIKACIN in the last 72 hours.   Microbiology: Recent Results (from the past 720 hour(s))  Blood culture (routine x 2)     Status: None (Preliminary result)   Collection Time: 03/24/16 10:28 PM  Result Value Ref Range Status   Specimen Description BLOOD RIGHT ASSIST CONTROL  Final   Special Requests BOTTLES DRAWN AEROBIC AND ANAEROBIC 15CC  Final   Culture NO GROWTH < 12 HOURS  Final   Report Status PENDING  Incomplete  Blood culture (routine x 2)     Status: None (Preliminary result)   Collection Time: 03/24/16 10:28 PM  Result Value Ref Range Status   Specimen Description BLOOD RIGHT HAND  Final   Special Requests BOTTLES DRAWN AEROBIC AND ANAEROBIC 14CC  Final   Culture NO GROWTH < 12 HOURS  Final   Report Status PENDING  Incomplete  C difficile quick scan w PCR reflex     Status: None   Collection Time: 03/25/16  8:30 AM  Result Value Ref Range Status   C Diff antigen NEGATIVE NEGATIVE Final   C Diff toxin NEGATIVE NEGATIVE Final   C Diff interpretation  No C. difficile detected.  Final  Gastrointestinal Panel by PCR , Stool     Status: Abnormal   Collection Time: 03/25/16  8:30 AM  Result Value Ref Range Status   Campylobacter species NOT DETECTED NOT DETECTED Final   Plesimonas shigelloides NOT DETECTED NOT DETECTED Final   Salmonella species NOT DETECTED NOT DETECTED Final   Yersinia enterocolitica NOT DETECTED NOT DETECTED Final   Vibrio species NOT DETECTED NOT DETECTED Final   Vibrio cholerae NOT DETECTED NOT DETECTED Final   Enteroaggregative E coli (EAEC) NOT DETECTED NOT DETECTED Final   Enteropathogenic E coli (EPEC) NOT DETECTED NOT DETECTED Final   Enterotoxigenic E coli (ETEC) NOT DETECTED NOT DETECTED Final   Shiga like toxin producing E coli (STEC) NOT DETECTED NOT DETECTED Final   Shigella/Enteroinvasive E coli (EIEC) DETECTED (A) NOT DETECTED Final    Comment: RESULT CALLED TO, READ BACK BY AND VERIFIED WITH: Henriette Combs AT 2043 03/25/16 SDR    Cryptosporidium NOT DETECTED NOT DETECTED Final   Cyclospora cayetanensis NOT DETECTED NOT DETECTED Final   Entamoeba histolytica NOT DETECTED NOT DETECTED Final   Giardia lamblia NOT DETECTED NOT DETECTED Final   Adenovirus F40/41 NOT DETECTED NOT DETECTED Final   Astrovirus NOT DETECTED NOT DETECTED Final   Norovirus GI/GII NOT DETECTED NOT DETECTED Final   Rotavirus A NOT DETECTED NOT DETECTED Final   Sapovirus (I, II, IV, and V) NOT DETECTED NOT DETECTED Final    Medical History: Past Medical  History:  Diagnosis Date  . Asthma   . COPD (chronic obstructive pulmonary disease) (HCC)   . Hypertension     Medications:  Scheduled:  . carvedilol  6.25 mg Oral BID  . ciprofloxacin  400 mg Intravenous Q12H  . enoxaparin (LOVENOX) injection  40 mg Subcutaneous Q24H  . famotidine  20 mg Oral Daily  . lisinopril  20 mg Oral Daily  . mometasone-formoterol  2 puff Inhalation BID  . montelukast  10 mg Oral Daily  . tiotropium  1 capsule Inhalation Daily    Assessment: CrCl = 56.6 ml/min  Goal of Therapy:  resolution of infection  Plan:  Expected duration 7 days with resolution of temperature and/or normalization of WBC   Ciprofloxacin 400 mg IV Q12H ordered to start on 10/22 .   Jashawna Reever D 03/25/2016,9:05 PM

## 2016-03-26 LAB — BASIC METABOLIC PANEL
Anion gap: 5 (ref 5–15)
BUN: 9 mg/dL (ref 6–20)
CO2: 31 mmol/L (ref 22–32)
Calcium: 8.6 mg/dL — ABNORMAL LOW (ref 8.9–10.3)
Chloride: 101 mmol/L (ref 101–111)
Creatinine, Ser: 1.41 mg/dL — ABNORMAL HIGH (ref 0.61–1.24)
GFR calc Af Amer: >60 mL/min (ref >60)
GFR, EST NON AFRICAN AMERICAN: 53 mL/min — AB (ref >60)
GLUCOSE: 87 mg/dL (ref 65–99)
POTASSIUM: 2.9 mmol/L — AB (ref 3.5–5.1)
Sodium: 137 mmol/L (ref 135–145)

## 2016-03-26 LAB — CBC
HEMATOCRIT: 35.5 % — AB (ref 40.0–52.0)
HEMOGLOBIN: 12.5 g/dL — AB (ref 13.0–18.0)
MCH: 37.4 pg — ABNORMAL HIGH (ref 26.0–34.0)
MCHC: 35.2 g/dL (ref 32.0–36.0)
MCV: 106.2 fL — ABNORMAL HIGH (ref 80.0–100.0)
Platelets: 170 10*3/uL (ref 150–440)
RBC: 3.35 MIL/uL — ABNORMAL LOW (ref 4.40–5.90)
RDW: 14.2 % (ref 11.5–14.5)
WBC: 5.7 10*3/uL (ref 3.8–10.6)

## 2016-03-26 MED ORDER — CIPROFLOXACIN HCL 500 MG PO TABS
500.0000 mg | ORAL_TABLET | Freq: Two times a day (BID) | ORAL | Status: DC
  Administered 2016-03-26: 500 mg via ORAL
  Filled 2016-03-26: qty 1

## 2016-03-26 MED ORDER — CIPROFLOXACIN HCL 500 MG PO TABS: 500.0000 mg | ORAL_TABLET | Freq: Two times a day (BID) | ORAL | 0 refills | Status: AC

## 2016-03-26 MED ORDER — POTASSIUM CHLORIDE 10 MEQ/100ML IV SOLN
10.0000 meq | INTRAVENOUS | Status: AC
  Administered 2016-03-26 (×2): 10 meq via INTRAVENOUS
  Filled 2016-03-26 (×2): qty 100

## 2016-03-26 MED ORDER — POTASSIUM CHLORIDE CRYS ER 20 MEQ PO TBCR: 40.0000 meq | EXTENDED_RELEASE_TABLET | Freq: Two times a day (BID) | ORAL | 0 refills | Status: DC

## 2016-03-26 MED ORDER — POTASSIUM CHLORIDE CRYS ER 20 MEQ PO TBCR
40.0000 meq | EXTENDED_RELEASE_TABLET | Freq: Once | ORAL | Status: AC
  Administered 2016-03-26: 40 meq via ORAL
  Filled 2016-03-26: qty 2

## 2016-03-26 NOTE — Discharge Summary (Signed)
Miguel Howell, 60 y.o., DOB [birth year], MRN [id number]. Admission date: 03/24/2016 Discharge Date 03/26/2016 Primary MD No PCP Per Patient Admitting Physician Oralia Manis, MD  Admission Diagnosis  Diarrhea of presumed infectious origin [A09] Hypokalemia [E87.6] Metabolic alkalosis [E87.3] Fever, unspecified fever cause [R50.9]  Discharge Diagnosis   Principal Problem:   Dehydration   Diarrhea due to Shigella   Hypokalemia   HTN (hypertension)   COPD (chronic obstructive pulmonary disease) (HCC)   History of asthma        Hospital Course   Miguel Howell  is a 60 y.o. male who presents with Persistent diarrhea for the last 4 days. Patient states that his symptoms began initially 4 days ago, when he had abdominal cramping and pain, as well as diarrhea. Patient recently was on amoxicillin 4 days ago. Due to the symptoms he came to the emergency room. And was noted to be dehydrated and had significant low potassium. Therefore he was admitted to the hospital for further evaluation and therapy. He was given aggressive IV fluids. His electrolytes were replaced. Patient underwent stool studies included C. difficile which was negative. His stool PCR came back positive for Shigella. Patient will be treated with ciprofloxacin. His potassium is low today which is being replaced. And I will discharge him on oral potassium are recommended he follow up with Dr. Judithann Sheen in few days. 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           Consults  None  Significant Tests:  See full reports for all details    Dg Chest 2 View  Result Date: 03/24/2016 CLINICAL DATA:  Fever and diarrhea for 3 days.  Occasional cough. EXAM: CHEST  2 VIEW COMPARISON:  09/26/2008 chest radiograph FINDINGS: The heart size and mediastinal contours are within normal limits. Both lungs are clear. Small anterior osteophytes noted along the lower thoracic spine. No acute osseous  abnormality. Small summation density at the right costophrenic angle likely related to atelectasis superimposed on ribs. IMPRESSION: No active cardiopulmonary disease. Electronically Signed   By: Tollie Eth M.D.   On: 03/24/2016 23:17       Today   Subjective:   Miguel Howell  feeling much better diarrhea almost resolved feeling much better very anxious to go home Objective:   Blood pressure (!) 129/55, pulse 76, temperature 97.9 F (36.6 C), temperature source Oral, resp. rate 18, height 5\' 9"  (1.753 m), weight 206 lb 14.4 oz (93.8 kg), SpO2 90 %.  .  Intake/Output Summary (Last 24 hours) at 03/26/16 1158 Last data filed at 03/26/16 0930  Gross per 24 hour  Intake             1120 ml  Output                0 ml  Net             1120 ml    Exam VITAL SIGNS: Blood pressure (!) 129/55, pulse 76, temperature 97.9 F (36.6 C), temperature source Oral, resp. rate 18, height 5\' 9"  (1.753 m), weight 206 lb 14.4 oz (93.8 kg), SpO2 90 %.  GENERAL:  60 y.o.-year-old patient lying in the bed with no acute distress.  EYES: Pupils equal, round, reactive to light and accommodation. No scleral icterus. Extraocular muscles intact.  HEENT: Head atraumatic, normocephalic. Oropharynx and nasopharynx clear.  NECK:  Supple, no jugular venous distention. No thyroid enlargement, no tenderness.  LUNGS: Normal  breath sounds bilaterally, no wheezing, rales,rhonchi or crepitation. No use of accessory muscles of respiration.  CARDIOVASCULAR: S1, S2 normal. No murmurs, rubs, or gallops.  ABDOMEN: Soft, nontender, nondistended. Bowel sounds present. No organomegaly or mass.  EXTREMITIES: No pedal edema, cyanosis, or clubbing.  NEUROLOGIC: Cranial nerves II through XII are intact. Muscle strength 5/5 in all extremities. Sensation intact. Gait not checked.  PSYCHIATRIC: The patient is alert and oriented x 3.  SKIN: No obvious rash, lesion, or ulcer.   Data Review     CBC w Diff: Lab Results  Component  Value Date   WBC 5.7 03/26/2016   HGB 12.5 (L) 03/26/2016   HCT 35.5 (L) 03/26/2016   PLT 170 03/26/2016   CMP: Lab Results  Component Value Date   NA 137 03/26/2016   K 2.9 (L) 03/26/2016   CL 101 03/26/2016   CO2 31 03/26/2016   BUN 9 03/26/2016   CREATININE 1.41 (H) 03/26/2016   PROT 7.8 03/24/2016   ALBUMIN 3.5 03/24/2016   BILITOT 1.0 03/24/2016   ALKPHOS 117 03/24/2016   AST 22 03/24/2016   ALT 12 (L) 03/24/2016  .  Micro Results Recent Results (from the past 240 hour(s))  Blood culture (routine x 2)     Status: None (Preliminary result)   Collection Time: 03/24/16 10:28 PM  Result Value Ref Range Status   Specimen Description BLOOD RIGHT ASSIST CONTROL  Final   Special Requests BOTTLES DRAWN AEROBIC AND ANAEROBIC 15CC  Final   Culture NO GROWTH 2 DAYS  Final   Report Status PENDING  Incomplete  Blood culture (routine x 2)     Status: None (Preliminary result)   Collection Time: 03/24/16 10:28 PM  Result Value Ref Range Status   Specimen Description BLOOD RIGHT HAND  Final   Special Requests BOTTLES DRAWN AEROBIC AND ANAEROBIC 14CC  Final   Culture NO GROWTH 2 DAYS  Final   Report Status PENDING  Incomplete  C difficile quick scan w PCR reflex     Status: None   Collection Time: 03/25/16  8:30 AM  Result Value Ref Range Status   C Diff antigen NEGATIVE NEGATIVE Final   C Diff toxin NEGATIVE NEGATIVE Final   C Diff interpretation No C. difficile detected.  Final  Gastrointestinal Panel by PCR , Stool     Status: Abnormal   Collection Time: 03/25/16  8:30 AM  Result Value Ref Range Status   Campylobacter species NOT DETECTED NOT DETECTED Final   Plesimonas shigelloides NOT DETECTED NOT DETECTED Final   Salmonella species NOT DETECTED NOT DETECTED Final   Yersinia enterocolitica NOT DETECTED NOT DETECTED Final   Vibrio species NOT DETECTED NOT DETECTED Final   Vibrio cholerae NOT DETECTED NOT DETECTED Final   Enteroaggregative E coli (EAEC) NOT DETECTED NOT  DETECTED Final   Enteropathogenic E coli (EPEC) NOT DETECTED NOT DETECTED Final   Enterotoxigenic E coli (ETEC) NOT DETECTED NOT DETECTED Final   Shiga like toxin producing E coli (STEC) NOT DETECTED NOT DETECTED Final   Shigella/Enteroinvasive E coli (EIEC) DETECTED (A) NOT DETECTED Final    Comment: RESULT CALLED TO, READ BACK BY AND VERIFIED WITH: [facility] AT 2043 03/25/16 SDR    Cryptosporidium NOT DETECTED NOT DETECTED Final   Cyclospora cayetanensis NOT DETECTED NOT DETECTED Final   Entamoeba histolytica NOT DETECTED NOT DETECTED Final   Giardia lamblia NOT DETECTED NOT DETECTED Final   Adenovirus F40/41 NOT DETECTED NOT DETECTED Final   Astrovirus NOT DETECTED  NOT DETECTED Final   Norovirus GI/GII NOT DETECTED NOT DETECTED Final   Rotavirus A NOT DETECTED NOT DETECTED Final   Sapovirus (I, II, IV, and V) NOT DETECTED NOT DETECTED Final        Code Status Orders        Start     Ordered   03/25/16 0247  Full code  Continuous     03/25/16 0246    Code Status History    Date Active Date Inactive Code Status Order ID Comments User Context   This patient has a current code status but no historical code status.          Follow-up Information    SPARKS,JEFFREY D, MD Follow up in 3 day(s).   Specialty:  Internal Medicine Why:  check bmp at time of visit Contact information: [address] [facility] [place] [address] [phone]           Discharge Medications     Medication List    STOP taking these medications   furosemide 40 MG tablet Commonly known as:  LASIX     TAKE these medications   carvedilol 6.25 MG tablet Commonly known as:  COREG Take 6.25 mg by mouth 2 (two) times daily.   ciprofloxacin 500 MG tablet Commonly known as:  CIPRO Take 1 tablet (500 mg total) by mouth 2 (two) times daily.   fexofenadine 180 MG tablet Commonly known as:  ALLEGRA Take 180 mg by mouth daily.   lisinopril 20 MG tablet Commonly  known as:  PRINIVIL,ZESTRIL Take 20 mg by mouth daily.   LORazepam 1 MG tablet Commonly known as:  ATIVAN Take 1 mg by mouth at bedtime as needed for sleep.   montelukast 10 MG tablet Commonly known as:  SINGULAIR Take 10 mg by mouth daily.   potassium chloride SA 20 MEQ tablet Commonly known as:  K-DUR,KLOR-CON Take 2 tablets (40 mEq total) by mouth 2 (two) times daily.   PROAIR HFA 108 (90 Base) MCG/ACT inhaler Generic drug:  albuterol Inhale 2 puffs into the lungs every 4 (four) hours as needed for shortness of breath.   ranitidine 150 MG tablet Commonly known as:  ZANTAC Take 150 mg by mouth daily.   SPIRIVA HANDIHALER 18 MCG inhalation capsule Generic drug:  tiotropium Place 1 capsule into inhaler and inhale daily.   SYMBICORT 160-4.5 MCG/ACT inhaler Generic drug:  budesonide-formoterol Inhale 2 puffs into the lungs 2 (two) times daily.          Total Time in preparing paper work, data evaluation and todays exam - 35 minutes  [place]ub[place] BilberryPATEL, Charlot Gouin M.D on 03/26/2016 at 11:58 AM  [facility]   Office  [phone]

## 2016-03-26 NOTE — Plan of Care (Signed)
Problem: Bowel/Gastric: Goal: Will not experience complications related to bowel motility Outcome: Progressing BM x 1 this shift

## 2016-03-26 NOTE — Progress Notes (Signed)
Discharge instructions given and went over with patient at bedside. All questions answered. Patient discharged home. Auden Tatar S, RN  

## 2016-03-29 LAB — CULTURE, BLOOD (ROUTINE X 2)
CULTURE: NO GROWTH
CULTURE: NO GROWTH

## 2016-06-04 ENCOUNTER — Emergency Department: Payer: BLUE CROSS/BLUE SHIELD

## 2016-06-04 ENCOUNTER — Encounter: Payer: Self-pay | Admitting: Emergency Medicine

## 2016-06-04 ENCOUNTER — Inpatient Hospital Stay
Admission: EM | Admit: 2016-06-04 | Discharge: 2016-06-07 | DRG: 871 | Disposition: A | Discharge status: Home / Self Care | Payer: BLUE CROSS/BLUE SHIELD | Attending: Internal Medicine | Admitting: Internal Medicine

## 2016-06-04 DIAGNOSIS — R945 Abnormal results of liver function studies: Secondary | ICD-10-CM | POA: Diagnosis present

## 2016-06-04 DIAGNOSIS — F419 Anxiety disorder, unspecified: Secondary | ICD-10-CM | POA: Diagnosis present

## 2016-06-04 DIAGNOSIS — J9601 Acute respiratory failure with hypoxia: Secondary | ICD-10-CM

## 2016-06-04 DIAGNOSIS — J45901 Unspecified asthma with (acute) exacerbation: Secondary | ICD-10-CM | POA: Diagnosis present

## 2016-06-04 DIAGNOSIS — Z6832 Body mass index (BMI) 32.0-32.9, adult: Secondary | ICD-10-CM | POA: Diagnosis not present

## 2016-06-04 DIAGNOSIS — K219 Gastro-esophageal reflux disease without esophagitis: Secondary | ICD-10-CM | POA: Diagnosis present

## 2016-06-04 DIAGNOSIS — B1910 Unspecified viral hepatitis B without hepatic coma: Secondary | ICD-10-CM | POA: Diagnosis present

## 2016-06-04 DIAGNOSIS — Z23 Encounter for immunization: Secondary | ICD-10-CM

## 2016-06-04 DIAGNOSIS — R740 Nonspecific elevation of levels of transaminase and lactic acid dehydrogenase [LDH]: Secondary | ICD-10-CM

## 2016-06-04 DIAGNOSIS — J441 Chronic obstructive pulmonary disease with (acute) exacerbation: Secondary | ICD-10-CM | POA: Diagnosis present

## 2016-06-04 DIAGNOSIS — Z79899 Other long term (current) drug therapy: Secondary | ICD-10-CM

## 2016-06-04 DIAGNOSIS — R7401 Elevation of levels of liver transaminase levels: Secondary | ICD-10-CM

## 2016-06-04 DIAGNOSIS — Z87891 Personal history of nicotine dependence: Secondary | ICD-10-CM | POA: Diagnosis not present

## 2016-06-04 DIAGNOSIS — R4701 Aphasia: Secondary | ICD-10-CM | POA: Diagnosis present

## 2016-06-04 DIAGNOSIS — J189 Pneumonia, unspecified organism: Secondary | ICD-10-CM | POA: Diagnosis present

## 2016-06-04 DIAGNOSIS — F1010 Alcohol abuse, uncomplicated: Secondary | ICD-10-CM | POA: Diagnosis present

## 2016-06-04 DIAGNOSIS — G4733 Obstructive sleep apnea (adult) (pediatric): Secondary | ICD-10-CM | POA: Diagnosis present

## 2016-06-04 DIAGNOSIS — I110 Hypertensive heart disease with heart failure: Secondary | ICD-10-CM | POA: Diagnosis present

## 2016-06-04 DIAGNOSIS — Y95 Nosocomial condition: Secondary | ICD-10-CM | POA: Diagnosis present

## 2016-06-04 DIAGNOSIS — J440 Chronic obstructive pulmonary disease with acute lower respiratory infection: Secondary | ICD-10-CM | POA: Diagnosis present

## 2016-06-04 DIAGNOSIS — A419 Sepsis, unspecified organism: Secondary | ICD-10-CM | POA: Diagnosis present

## 2016-06-04 DIAGNOSIS — R262 Difficulty in walking, not elsewhere classified: Secondary | ICD-10-CM

## 2016-06-04 DIAGNOSIS — N179 Acute kidney failure, unspecified: Secondary | ICD-10-CM | POA: Diagnosis present

## 2016-06-04 DIAGNOSIS — R52 Pain, unspecified: Secondary | ICD-10-CM

## 2016-06-04 DIAGNOSIS — Z7951 Long term (current) use of inhaled steroids: Secondary | ICD-10-CM

## 2016-06-04 DIAGNOSIS — I509 Heart failure, unspecified: Secondary | ICD-10-CM | POA: Diagnosis present

## 2016-06-04 LAB — COMPREHENSIVE METABOLIC PANEL
ALBUMIN: 2.9 g/dL — AB (ref 3.5–5.0)
ALT: 254 U/L — AB (ref 17–63)
AST: 310 U/L — AB (ref 15–41)
Alkaline Phosphatase: 180 U/L — ABNORMAL HIGH (ref 38–126)
Anion gap: 8 (ref 5–15)
BUN: 34 mg/dL — AB (ref 6–20)
CHLORIDE: 88 mmol/L — AB (ref 101–111)
CO2: 38 mmol/L — AB (ref 22–32)
CREATININE: 2.02 mg/dL — AB (ref 0.61–1.24)
Calcium: 8.7 mg/dL — ABNORMAL LOW (ref 8.9–10.3)
GFR calc Af Amer: 40 mL/min — ABNORMAL LOW (ref >60)
GFR, EST NON AFRICAN AMERICAN: 35 mL/min — AB (ref >60)
GLUCOSE: 100 mg/dL — AB (ref 65–99)
Potassium: 4.1 mmol/L (ref 3.5–5.1)
Sodium: 134 mmol/L — ABNORMAL LOW (ref 135–145)
Total Bilirubin: 1.5 mg/dL — ABNORMAL HIGH (ref 0.3–1.2)
Total Protein: 7.4 g/dL (ref 6.5–8.1)

## 2016-06-04 LAB — BLOOD GAS, ARTERIAL
ACID-BASE EXCESS: 6 mmol/L — AB (ref 0.0–2.0)
BICARBONATE: 34.7 mmol/L — AB (ref 20.0–28.0)
Delivery systems: POSITIVE
Expiratory PAP: 6
FIO2: 0.4
Inspiratory PAP: 16
O2 SAT: 92.1 %
PATIENT TEMPERATURE: 37
PCO2 ART: 69 mmHg — AB (ref 32.0–48.0)
PO2 ART: 70 mmHg — AB (ref 83.0–108.0)
pH, Arterial: 7.31 — ABNORMAL LOW (ref 7.350–7.450)

## 2016-06-04 LAB — CBC WITH DIFFERENTIAL/PLATELET
Basophils Absolute: 0 10*3/uL (ref 0–0.1)
Basophils Relative: 0 %
EOS PCT: 0 %
Eosinophils Absolute: 0 10*3/uL (ref 0–0.7)
HEMATOCRIT: 38.2 % — AB (ref 40.0–52.0)
Hemoglobin: 13 g/dL (ref 13.0–18.0)
LYMPHS ABS: 1.1 10*3/uL (ref 1.0–3.6)
LYMPHS PCT: 11 %
MCH: 35.5 pg — AB (ref 26.0–34.0)
MCHC: 34.1 g/dL (ref 32.0–36.0)
MCV: 104.1 fL — AB (ref 80.0–100.0)
MONO ABS: 0.7 10*3/uL (ref 0.2–1.0)
MONOS PCT: 7 %
NEUTROS ABS: 9 10*3/uL — AB (ref 1.4–6.5)
Neutrophils Relative %: 82 %
PLATELETS: 239 10*3/uL (ref 150–440)
RBC: 3.66 MIL/uL — ABNORMAL LOW (ref 4.40–5.90)
RDW: 16 % — AB (ref 11.5–14.5)
WBC: 10.9 10*3/uL — ABNORMAL HIGH (ref 3.8–10.6)

## 2016-06-04 LAB — TROPONIN I
TROPONIN I: 0.03 ng/mL — AB (ref <0.03)
Troponin I: <0.03 ng/mL (ref <0.03)
Troponin I: <0.03 ng/mL (ref <0.03)

## 2016-06-04 LAB — PROTIME-INR
INR: 1.27
Prothrombin Time: 16 seconds — ABNORMAL HIGH (ref 11.4–15.2)

## 2016-06-04 LAB — GLUCOSE, CAPILLARY: Glucose-Capillary: 90 mg/dL (ref 65–99)

## 2016-06-04 LAB — URINALYSIS, COMPLETE (UACMP) WITH MICROSCOPIC
BILIRUBIN URINE: NEGATIVE
GLUCOSE, UA: NEGATIVE mg/dL
HGB URINE DIPSTICK: NEGATIVE
Ketones, ur: NEGATIVE mg/dL
LEUKOCYTES UA: NEGATIVE
NITRITE: NEGATIVE
PROTEIN: NEGATIVE mg/dL
SPECIFIC GRAVITY, URINE: 1.011 (ref 1.005–1.030)
pH: 5 (ref 5.0–8.0)

## 2016-06-04 LAB — LACTIC ACID, PLASMA
LACTIC ACID, VENOUS: 1.5 mmol/L (ref 0.5–1.9)
Lactic Acid, Venous: 2 mmol/L (ref 0.5–1.9)

## 2016-06-04 LAB — ACETAMINOPHEN LEVEL: Acetaminophen (Tylenol), Serum: <10 ug/mL — ABNORMAL LOW (ref 10–30)

## 2016-06-04 MED ORDER — METHYLPREDNISOLONE SODIUM SUCC 40 MG IJ SOLR
40.0000 mg | Freq: Four times a day (QID) | INTRAMUSCULAR | Status: DC
  Administered 2016-06-05 – 2016-06-07 (×11): 40 mg via INTRAVENOUS
  Filled 2016-06-04 (×11): qty 1

## 2016-06-04 MED ORDER — LORAZEPAM 1 MG PO TABS: 1.0000 mg | ORAL_TABLET | Freq: Every evening | ORAL | Status: DC | PRN

## 2016-06-04 MED ORDER — BUDESONIDE 0.5 MG/2ML IN SUSP
0.5000 mg | Freq: Two times a day (BID) | RESPIRATORY_TRACT | Status: DC
  Administered 2016-06-05 – 2016-06-07 (×5): 0.5 mg via RESPIRATORY_TRACT
  Filled 2016-06-04 (×5): qty 2

## 2016-06-04 MED ORDER — ALBUTEROL SULFATE (2.5 MG/3ML) 0.083% IN NEBU
5.0000 mg | INHALATION_SOLUTION | Freq: Once | RESPIRATORY_TRACT | Status: AC
  Administered 2016-06-04: 5 mg via RESPIRATORY_TRACT
  Filled 2016-06-04: qty 6

## 2016-06-04 MED ORDER — ALBUTEROL SULFATE (2.5 MG/3ML) 0.083% IN NEBU
INHALATION_SOLUTION | RESPIRATORY_TRACT | Status: AC
  Filled 2016-06-04: qty 6

## 2016-06-04 MED ORDER — CARVEDILOL 6.25 MG PO TABS
6.2500 mg | ORAL_TABLET | Freq: Two times a day (BID) | ORAL | Status: DC
  Administered 2016-06-05 – 2016-06-07 (×5): 6.25 mg via ORAL
  Filled 2016-06-04 (×5): qty 1

## 2016-06-04 MED ORDER — FAMOTIDINE 20 MG PO TABS
20.0000 mg | ORAL_TABLET | Freq: Two times a day (BID) | ORAL | Status: DC
  Administered 2016-06-05 – 2016-06-07 (×5): 20 mg via ORAL
  Filled 2016-06-04 (×5): qty 1

## 2016-06-04 MED ORDER — CEFTRIAXONE SODIUM-DEXTROSE 1-3.74 GM-% IV SOLR
1.0000 g | INTRAVENOUS | Status: DC
  Administered 2016-06-06 – 2016-06-07 (×2): 1 g via INTRAVENOUS
  Filled 2016-06-04 (×3): qty 50

## 2016-06-04 MED ORDER — CEFTRIAXONE SODIUM 1 G IJ SOLR
1.0000 g | INTRAMUSCULAR | Status: DC
  Filled 2016-06-04: qty 10

## 2016-06-04 MED ORDER — ORAL CARE MOUTH RINSE
15.0000 mL | Freq: Two times a day (BID) | OROMUCOSAL | Status: DC
  Administered 2016-06-07: 15 mL via OROMUCOSAL

## 2016-06-04 MED ORDER — INFLUENZA VAC SPLIT QUAD 0.5 ML IM SUSY
0.5000 mL | PREFILLED_SYRINGE | INTRAMUSCULAR | Status: AC
  Administered 2016-06-05: 0.5 mL via INTRAMUSCULAR
  Filled 2016-06-04: qty 0.5

## 2016-06-04 MED ORDER — CEFTRIAXONE SODIUM-DEXTROSE 1-3.74 GM-% IV SOLR
1.0000 g | Freq: Once | INTRAVENOUS | Status: AC
  Administered 2016-06-05: 1 g via INTRAVENOUS
  Filled 2016-06-04: qty 50

## 2016-06-04 MED ORDER — SODIUM CHLORIDE 0.9 % IV BOLUS (SEPSIS)
1000.0000 mL | Freq: Once | INTRAVENOUS | Status: AC
  Administered 2016-06-04: 1000 mL via INTRAVENOUS

## 2016-06-04 MED ORDER — SODIUM CHLORIDE 0.9 % IV SOLN
INTRAVENOUS | Status: DC
  Administered 2016-06-05 – 2016-06-07 (×3): via INTRAVENOUS

## 2016-06-04 MED ORDER — ACETAMINOPHEN 325 MG PO TABS: 650.0000 mg | ORAL_TABLET | Freq: Four times a day (QID) | ORAL | Status: DC | PRN

## 2016-06-04 MED ORDER — CHLORHEXIDINE GLUCONATE 0.12 % MT SOLN
15.0000 mL | Freq: Two times a day (BID) | OROMUCOSAL | Status: DC
  Administered 2016-06-05 – 2016-06-07 (×6): 15 mL via OROMUCOSAL
  Filled 2016-06-04 (×6): qty 15

## 2016-06-04 MED ORDER — ACETAMINOPHEN 650 MG RE SUPP: 650.0000 mg | Freq: Four times a day (QID) | RECTAL | Status: DC | PRN

## 2016-06-04 MED ORDER — ALBUTEROL SULFATE (2.5 MG/3ML) 0.083% IN NEBU: 3.0000 mL | INHALATION_SOLUTION | RESPIRATORY_TRACT | Status: DC | PRN

## 2016-06-04 MED ORDER — IPRATROPIUM-ALBUTEROL 0.5-2.5 (3) MG/3ML IN SOLN
3.0000 mL | Freq: Once | RESPIRATORY_TRACT | Status: AC
  Administered 2016-06-04: 3 mL via RESPIRATORY_TRACT
  Filled 2016-06-04: qty 3

## 2016-06-04 MED ORDER — ONDANSETRON HCL 4 MG PO TABS: 4.0000 mg | ORAL_TABLET | Freq: Four times a day (QID) | ORAL | Status: DC | PRN

## 2016-06-04 MED ORDER — VANCOMYCIN HCL IN DEXTROSE 1-5 GM/200ML-% IV SOLN
1000.0000 mg | Freq: Once | INTRAVENOUS | Status: AC
  Administered 2016-06-04: 1000 mg via INTRAVENOUS
  Filled 2016-06-04: qty 200

## 2016-06-04 MED ORDER — AZITHROMYCIN 250 MG PO TABS
250.0000 mg | ORAL_TABLET | Freq: Every day | ORAL | Status: DC
  Administered 2016-06-05 – 2016-06-07 (×3): 250 mg via ORAL
  Filled 2016-06-04 (×3): qty 1

## 2016-06-04 MED ORDER — METHYLPREDNISOLONE SODIUM SUCC 125 MG IJ SOLR
60.0000 mg | Freq: Once | INTRAMUSCULAR | Status: AC
  Administered 2016-06-04: 60 mg via INTRAVENOUS
  Filled 2016-06-04: qty 2

## 2016-06-04 MED ORDER — SODIUM CHLORIDE 0.9 % IV BOLUS (SEPSIS)
500.0000 mL | Freq: Once | INTRAVENOUS | Status: AC
  Administered 2016-06-04: 500 mL via INTRAVENOUS

## 2016-06-04 MED ORDER — PNEUMOCOCCAL VAC POLYVALENT 25 MCG/0.5ML IJ INJ
0.5000 mL | INJECTION | INTRAMUSCULAR | Status: AC
  Administered 2016-06-05: 0.5 mL via INTRAMUSCULAR
  Filled 2016-06-04: qty 0.5

## 2016-06-04 MED ORDER — HEPARIN SODIUM (PORCINE) 5000 UNIT/ML IJ SOLN
5000.0000 [IU] | Freq: Three times a day (TID) | INTRAMUSCULAR | Status: DC
  Administered 2016-06-05 – 2016-06-07 (×7): 5000 [IU] via SUBCUTANEOUS
  Filled 2016-06-04 (×7): qty 1

## 2016-06-04 MED ORDER — CEFEPIME-DEXTROSE 2 GM/50ML IV SOLR
2.0000 g | Freq: Once | INTRAVENOUS | Status: AC
  Administered 2016-06-04: 2 g via INTRAVENOUS
  Filled 2016-06-04: qty 50

## 2016-06-04 MED ORDER — IPRATROPIUM-ALBUTEROL 0.5-2.5 (3) MG/3ML IN SOLN
3.0000 mL | Freq: Four times a day (QID) | RESPIRATORY_TRACT | Status: DC
  Administered 2016-06-05 – 2016-06-07 (×10): 3 mL via RESPIRATORY_TRACT
  Filled 2016-06-04 (×11): qty 3

## 2016-06-04 MED ORDER — ONDANSETRON HCL 4 MG/2ML IJ SOLN: 4.0000 mg | Freq: Four times a day (QID) | INTRAMUSCULAR | Status: DC | PRN

## 2016-06-04 MED ORDER — SODIUM CHLORIDE 0.9% FLUSH
3.0000 mL | Freq: Two times a day (BID) | INTRAVENOUS | Status: DC
  Administered 2016-06-04 – 2016-06-07 (×5): 3 mL via INTRAVENOUS

## 2016-06-04 NOTE — H&P (Signed)
[employer] - [place] [facility]   PATIENT NAME: Miguel Howell    MR#:  [id number]  DATE OF BIRTH:  1956/04/28  DATE OF ADMISSION:  06/04/2016  PRIMARY CARE PHYSICIAN: Marguarite ArbourSPARKS,JEFFREY D, MD   REQUESTING/REFERRING PHYSICIAN: Dr. Willy EddyPatrick Robinson  CHIEF COMPLAINT:   Chief Complaint  Patient presents with  . Weakness  . Aphasia    HISTORY OF PRESENT ILLNESS:  Miguel Howell  is a 60 y.o. male with a known history of COPD, hypertension, questionable history of hepatitis B who presents to the hospital due to generalized weakness and lethargy. Patient says that people at work noticed that he was more lethargic than usual and for the past few days she's also been feeling short of breath and has a cough that has been productive of green-yellow sputum. He therefore came to the ER for further evaluation and was noted to be in acute respiratory failure with hypoxia secondary to COPD exacerbation. His chest x-ray was suggestive of pneumonia. He was also noted to be in acute kidney injury and also noted to have abnormal LFTs and therefore hospitalist services were contacted further treatment and evaluation. Patient denies any chest pains, abdominal pains, nausea vomiting diarrhea or any other associated symptoms presently.  PAST MEDICAL HISTORY:   Past Medical History:  Diagnosis Date  . Asthma   . COPD (chronic obstructive pulmonary disease) (HCC)   . Hypertension     PAST SURGICAL HISTORY:   Past Surgical History:  Procedure Laterality Date  . HERNIA REPAIR      SOCIAL HISTORY:   Social History  Substance Use Topics  . Smoking status: Former Smoker    Packs/day: 1.50    Years: 40.00    Types: Cigarettes    Quit date: 07/27/2015  . Smokeless tobacco: Former NeurosurgeonUser  . Alcohol use 1.2 oz/week    2 Glasses of wine per week     Comment: 3-4 mixed drinks every other day (Vodka, bourbon)    FAMILY HISTORY:   Family History  Problem Relation Age of Onset  .  Diabetes Mother   . Diabetes Father   . COPD Father   . Heart attack Father     DRUG ALLERGIES:  No Known Allergies  REVIEW OF SYSTEMS:   Review of Systems  Constitutional: Negative for fever and weight loss.  HENT: Negative for congestion, nosebleeds and tinnitus.   Eyes: Negative for blurred vision, double vision and redness.  Respiratory: Positive for cough and shortness of breath. Negative for hemoptysis.   Cardiovascular: Negative for chest pain, orthopnea, leg swelling and PND.  Gastrointestinal: Negative for abdominal pain, diarrhea, melena, nausea and vomiting.  Genitourinary: Negative for dysuria, hematuria and urgency.  Musculoskeletal: Negative for falls and joint pain.  Neurological: Positive for weakness. Negative for dizziness, tingling, sensory change, focal weakness, seizures and headaches.  Endo/Heme/Allergies: Negative for polydipsia. Does not bruise/bleed easily.  Psychiatric/Behavioral: Negative for depression and memory loss. The patient is not nervous/anxious.     MEDICATIONS AT HOME:   Prior to Admission medications   Medication Sig Start Date End Date Taking? Authorizing Provider  carvedilol (COREG) 6.25 MG tablet Take 6.25 mg by mouth 2 (two) times daily. 02/17/16  Yes Historical Provider, MD  furosemide (LASIX) 40 MG tablet Take 40 mg by mouth 2 (two) times daily. 05/10/16  Yes Historical Provider, MD  lisinopril (PRINIVIL,ZESTRIL) 20 MG tablet Take 20 mg by mouth daily. 01/28/16  Yes Historical Provider, MD  SPIRIVA HANDIHALER 18 MCG inhalation capsule  Place 1 capsule into inhaler and inhale daily. 03/09/16  Yes Historical Provider, MD  SYMBICORT 160-4.5 MCG/ACT inhaler Inhale 2 puffs into the lungs 2 (two) times daily. 02/07/16  Yes Historical Provider, MD  LORazepam (ATIVAN) 1 MG tablet Take 1 mg by mouth at bedtime as needed for sleep. 02/17/16   Historical Provider, MD  potassium chloride SA (K-DUR,KLOR-CON) 20 MEQ tablet Take 2 tablets (40 mEq total) by  mouth 2 (two) times daily. Patient not taking: Reported on 06/04/2016 03/26/16 03/29/16  Auburn Bilberry, MD  PROAIR HFA 108 [phone] Base) MCG/ACT inhaler Inhale 2 puffs into the lungs every 4 (four) hours as needed for shortness of breath. 03/05/16   Historical Provider, MD  ranitidine (ZANTAC) 150 MG tablet Take 150 mg by mouth daily.    Historical Provider, MD      VITAL SIGNS:  Blood pressure (!) 90/53, pulse 61, temperature 97.5 F (36.4 C), temperature source Oral, resp. rate 14, height 5\' 10"  (1.778 m), weight 104.3 kg (230 lb), SpO2 93 %.  PHYSICAL EXAMINATION:  Physical Exam  GENERAL:  60 y.o.-year-old patient lying in the bed in mild resp. distress.  EYES: Pupils equal, round, reactive to light and accommodation. No scleral icterus. Extraocular muscles intact.  HEENT: Head atraumatic, normocephalic. Oropharynx and nasopharynx clear. No oropharyngeal erythema, moist oral mucosa  NECK:  Supple, no jugular venous distention. No thyroid enlargement, no tenderness.  LUNGS: Prolonged insp & exp. phase, end-exp wheezing b/l, No rales, rhonchi. No use of accessory muscles of respiration.  CARDIOVASCULAR: S1, S2 RRR. No murmurs, No rubs, gallops, clicks.  ABDOMEN: Soft, nontender, nondistended. Bowel sounds present. No organomegaly or mass.  EXTREMITIES: +1-2 edema b/l, No cyanosis, or clubbing. + 2 pedal & radial pulses b/l.   NEUROLOGIC: Cranial nerves II through XII are intact. No focal Motor or sensory deficits appreciated b/l PSYCHIATRIC: The patient is alert and oriented x 3. Good affect.  SKIN: No obvious rash, lesion, or ulcer.   LABORATORY PANEL:   CBC  Recent Labs Lab 06/04/16 1039  WBC 10.9*  HGB 13.0  HCT 38.2*  PLT 239   ------------------------------------------------------------------------------------------------------------------  Chemistries   Recent Labs Lab 06/04/16 1039  NA 134*  K 4.1  CL 88*  CO2 38*  GLUCOSE 100*  BUN 34*  CREATININE 2.02*   CALCIUM 8.7*  AST 310*  ALT 254*  ALKPHOS 180*  BILITOT 1.5*   ------------------------------------------------------------------------------------------------------------------  Cardiac Enzymes  Recent Labs Lab 06/04/16 1039  TROPONINI 0.03*   ------------------------------------------------------------------------------------------------------------------  RADIOLOGY:  Dg Chest Portable 1 View  Result Date: 06/04/2016 CLINICAL DATA:  Patient arrives from home via POV with complaint of generalized weakness x 3-4 days, slurred speech. During triage assessment, patient noted to be pale appearing with hypoxia, pale mucous membranes, and hypotensive. Hx/o COPD and HTN. Former smoker. EXAM: PORTABLE CHEST 1 VIEW COMPARISON:  03/24/2016 FINDINGS: Heart size is normal. No focal consolidations. There are slightly prominent markings at the bases, right greater than left raising the question of shallow inflation versus early infiltrate. Consider follow-up as needed to evaluate for infiltrate. IMPRESSION: Question early infiltrate at the bases, right greater than left. Follow-up is recommended. Electronically Signed   By: Norva Pavlov M.D.   On: 06/04/2016 11:08   US Abdomen Limited Ruq  Result Date: 06/04/2016 CLINICAL DATA:  Abnormal liver enzymes.  Abdominal pain. EXAM: US ABDOMEN LIMITED - RIGHT UPPER QUADRANT COMPARISON:  10/02/2008 FINDINGS: Gallbladder: Gallbladder wall is thickened with hypoechoic edema measuring 4.8 mm. Gallbladder sludge is  present. No pericholecystic fluid. No sonographic Murphy's sign. Common bile duct: Diameter: 2.5 mm Liver: No focal liver lesions are identified. Mildly increased echotexture of the liver. On Doppler evaluation of the portal vein, there is color Doppler evidence for bidirectional flow. IMPRESSION: 1. Gallbladder wall thickening and gallbladder sludge, nonspecific findings. No other evidence for acute cholecystitis. 2. Mildly echogenic liver. 3.  Possible bidirectional flow within the portal vein. Consider dedicated Doppler evaluation of the portal vein to evaluate for portal venous hypertension. Electronically Signed   By: Norva PavlovElizabeth  Brown M.D.   On: 06/04/2016 12:21     IMPRESSION AND PLAN:   60 year old male with past medical history of COPD, COPD, history of CHF, anxiety who presents to the hospital due to weakness lethargy and noted to be in acute respiratory failure with hypoxia.  1. Acute respiratory failure with hypoxia-secondary to COPD exacerbation. -Continue O2 supplementation, we'll treat underlying COPD with IV steroids, scheduled DuoNeb's, Pulmicort nebs. -Wean O2 as tolerated.  2. COPD exacerbation-secondary to pneumonia. -We'll place on IV steroids, scheduled DuoNeb's, Pulmicort nebs. -Treat underlying pneumonia with IV ceftriaxone, Zithromax.  3. Pneumonia-skinny acquired. -She with IV ceftriaxone Zithromax, follow blood, sputum cultures.  4. Lethargy/weakness-etiology unclear. Patient has underlying COPD exacerbation. We'll check an ABG to rule out hypercarbia. -Check ammonia level.  5. Abnormal LFTs-secondary to alcohol abuse as patient has had multiple mixed drinks over the past holidays. -Limited abdominal ultrasound showing no acute pathology. We'll follow LFTs.  6. GERD-continue Pepcid.  7. Acute kidney injury-we'll give the patient gentle IV fluids, hold Lasix, lisinopril. -Follow BUN and creatinine.  All the records are reviewed and case discussed with ED provider. Management plans discussed with the patient, family and they are in agreement.  CODE STATUS: Full  TOTAL TIME TAKING CARE OF THIS PATIENT: 45 minutes.    Houston SirenSAINANI,Sayler Mickiewicz J M.D on 06/04/2016 at 3:03 PM  Between 7am to 6pm - Pager - [phone]  After 6pm go to [email or web address] - password [email or web address] [facility]  [facility]  Office  [phone]  CC: Primary care physician; Marguarite ArbourSPARKS,JEFFREY D, MD

## 2016-06-04 NOTE — ED Provider Notes (Signed)
[facility] Emergency Department Provider Note    First MD Initiated Contact with Patient 06/04/16 1045     (approximate)  I have reviewed the triage vital signs and the nursing notes.   HISTORY  Chief Complaint Weakness and Aphasia    HPI Miguel Howell is a 60 y.o. male with a history of hepatitis B as well as COPD and asthma presents with generalized weakness and fatigue over the past 3-4 days. Denies any fevers. Denies any abdominal pain. Does feel short of breath with any sort of exertion. Denies HIV. No melena. No hematochezia. No diarrhea or vomiting.     Past Medical History:  Diagnosis Date  . Asthma   . COPD (chronic obstructive pulmonary disease) (HCC)   . Hypertension    History reviewed. No pertinent family history. Past Surgical History:  Procedure Laterality Date  . HERNIA REPAIR     Patient Active Problem List   Diagnosis Date Noted  . Diarrhea 03/25/2016  . Dehydration 03/25/2016  . Hypokalemia 03/25/2016  . HTN (hypertension) 03/25/2016  . COPD (chronic obstructive pulmonary disease) (HCC) 03/25/2016      Prior to Admission medications   Medication Sig Start Date End Date Taking? Authorizing Provider  carvedilol (COREG) 6.25 MG tablet Take 6.25 mg by mouth 2 (two) times daily. 02/17/16   Historical Provider, MD  fexofenadine (ALLEGRA) 180 MG tablet Take 180 mg by mouth daily.    Historical Provider, MD  lisinopril (PRINIVIL,ZESTRIL) 20 MG tablet Take 20 mg by mouth daily. 01/28/16   Historical Provider, MD  LORazepam (ATIVAN) 1 MG tablet Take 1 mg by mouth at bedtime as needed for sleep. 02/17/16   Historical Provider, MD  montelukast (SINGULAIR) 10 MG tablet Take 10 mg by mouth daily. 03/08/16   Historical Provider, MD  potassium chloride SA (K-DUR,KLOR-CON) 20 MEQ tablet Take 2 tablets (40 mEq total) by mouth 2 (two) times daily. 03/26/16 03/29/16  Auburn Bilberry, MD  PROAIR HFA 108 (90 Base) MCG/ACT inhaler Inhale 2 puffs  into the lungs every 4 (four) hours as needed for shortness of breath. 03/05/16   Historical Provider, MD  ranitidine (ZANTAC) 150 MG tablet Take 150 mg by mouth daily.    Historical Provider, MD  SPIRIVA HANDIHALER 18 MCG inhalation capsule Place 1 capsule into inhaler and inhale daily. 03/09/16   Historical Provider, MD  SYMBICORT 160-4.5 MCG/ACT inhaler Inhale 2 puffs into the lungs 2 (two) times daily. 02/07/16   Historical Provider, MD    Allergies Patient has no known allergies.    Social History Social History  Substance Use Topics  . Smoking status: Former Smoker    Types: Cigarettes    Quit date: 07/27/2015  . Smokeless tobacco: Former Neurosurgeon  . Alcohol use Yes    Review of Systems Patient denies headaches, rhinorrhea, blurry vision, numbness, shortness of breath, chest pain, edema, cough, abdominal pain, nausea, vomiting, diarrhea, dysuria, fevers, rashes or hallucinations unless otherwise stated above in HPI. ____________________________________________   PHYSICAL EXAM:  VITAL SIGNS: Vitals:   06/04/16 1018 06/04/16 1100  BP: (!) 89/54 94/65  Pulse: 64 (!) 59  Resp: 16 16  Temp: 97.5 F (36.4 C)     Constitutional: Alert and oriented. Acutely ill appearing Eyes: Conjunctivae are normal. PERRL. EOMI. Head: Atraumatic. Nose: No congestion/rhinnorhea. Mouth/Throat: Mucous membranes are dry   Oropharynx non-erythematous. Neck: No stridor. Painless ROM. No cervical spine tenderness to palpation Hematological/Lymphatic/Immunilogical: No cervical lymphadenopathy. Cardiovascular: Normal rate, regular rhythm. Grossly normal  heart sounds.  Good peripheral circulation. Respiratory: Normal respiratory effort.  No retractions. Lungs CTAB. Gastrointestinal: Soft and nontender. No distention. No abdominal bruits. No CVA tenderness. Musculoskeletal: No lower extremity tenderness nor edema.  No joint effusions. Neurologic:  Drowsy, No gross focal neurologic deficits are  appreciated.  Skin:  Skin is warm, dry and intact. No rash noted. Psychiatric: Mood and affect are normal. Speech and behavior are normal.  ____________________________________________   LABS (all labs ordered are listed, but only abnormal results are displayed)  Results for orders placed or performed during the hospital encounter of 06/04/16 (from the past 24 hour(s))  Comprehensive metabolic panel     Status: Abnormal   Collection Time: 06/04/16 10:39 AM  Result Value Ref Range   Sodium 134 (L) 135 - 145 mmol/L   Potassium 4.1 3.5 - 5.1 mmol/L   Chloride 88 (L) 101 - 111 mmol/L   CO2 38 (H) 22 - 32 mmol/L   Glucose, Bld 100 (H) 65 - 99 mg/dL   BUN 34 (H) 6 - 20 mg/dL   Creatinine, Ser 1.612.02 (H) 0.61 - 1.24 mg/dL   Calcium 8.7 (L) 8.9 - 10.3 mg/dL   Total Protein 7.4 6.5 - 8.1 g/dL   Albumin 2.9 (L) 3.5 - 5.0 g/dL   AST 096310 (H) 15 - 41 U/L   ALT 254 (H) 17 - 63 U/L   Alkaline Phosphatase 180 (H) 38 - 126 U/L   Total Bilirubin 1.5 (H) 0.3 - 1.2 mg/dL   GFR calc non Af Amer 35 (L) >60 mL/min   GFR calc Af Amer 40 (L) >60 mL/min   Anion gap 8 5 - 15  Troponin I     Status: Abnormal   Collection Time: 06/04/16 10:39 AM  Result Value Ref Range   Troponin I 0.03 (HH) <0.03 ng/mL  CBC WITH DIFFERENTIAL     Status: Abnormal   Collection Time: 06/04/16 10:39 AM  Result Value Ref Range   WBC 10.9 (H) 3.8 - 10.6 K/uL   RBC 3.66 (L) 4.40 - 5.90 MIL/uL   Hemoglobin 13.0 13.0 - 18.0 g/dL   HCT 04.538.2 (L) 40.940.0 - 81.152.0 %   MCV 104.1 (H) 80.0 - 100.0 fL   MCH 35.5 (H) 26.0 - 34.0 pg   MCHC 34.1 32.0 - 36.0 g/dL   RDW 91.416.0 (H) 78.211.5 - 95.614.5 %   Platelets 239 150 - 440 K/uL   Neutrophils Relative % 82 %   Neutro Abs 9.0 (H) 1.4 - 6.5 K/uL   Lymphocytes Relative 11 %   Lymphs Abs 1.1 1.0 - 3.6 K/uL   Monocytes Relative 7 %   Monocytes Absolute 0.7 0.2 - 1.0 K/uL   Eosinophils Relative 0 %   Eosinophils Absolute 0.0 0 - 0.7 K/uL   Basophils Relative 0 %   Basophils Absolute 0.0 0 - 0.1  K/uL  Glucose, capillary     Status: None   Collection Time: 06/04/16 10:48 AM  Result Value Ref Range   Glucose-Capillary 90 65 - 99 mg/dL  Lactic acid, plasma     Status: Abnormal   Collection Time: 06/04/16 10:49 AM  Result Value Ref Range   Lactic Acid, Venous 2.0 (HH) 0.5 - 1.9 mmol/L   ____________________________________________  EKG My review and personal interpretation at Time: 10:39   Indication: sob, hypoxia  Rate: 60  Rhythm: sinus Axis: normal Other: no acute st changes, normal intervals. ____________________________________________  RADIOLOGY  I personally reviewed all radiographic images ordered to  evaluate for the above acute complaints and reviewed radiology reports and findings.  These findings were personally discussed with the patient.  Please see medical record for radiology report. ____________________________________________   PROCEDURES  Procedure(s) performed:  Procedures    Critical Care performed: yes CRITICAL CARE Performed by: Willy EddyPatrick Toshika Parrow   Total critical care time: 50 minutes  Critical care time was exclusive of separately billable procedures and treating other patients.  Critical care was necessary to treat or prevent imminent or life-threatening deterioration.  Critical care was time spent personally by me on the following activities: development of treatment plan with patient and/or surrogate as well as nursing, discussions with consultants, evaluation of patient's response to treatment, examination of patient, obtaining history from patient or surrogate, ordering and performing treatments and interventions, ordering and review of laboratory studies, ordering and review of radiographic studies, pulse oximetry and re-evaluation of patient's condition.  ____________________________________________   INITIAL IMPRESSION / ASSESSMENT AND PLAN / ED COURSE  Pertinent labs & imaging results that were available during my care of the  patient were reviewed by me and considered in my medical decision making (see chart for details).  DDX: Asthma, copd, CHF, pna, ptx, malignancy, Pe, anemia   Maryla MorrowDaniel W Fluellen is a 60 y.o. who presents to the ED with hypotension and respiratory distress. Presentation is slightly concerning for sepsis given altered mental status hypoxia and tachypnea. Given IV fluid bolus. Bedside ultrasound shows collapsible IVC.  Will continue IVF for resuscitation.  Will continue with septic workup.  The patient will be placed on continuous pulse oximetry and telemetry for monitoring.  Laboratory evaluation will be sent to evaluate for the above complaints.     Clinical Course as of Jun 04 1314  Sun Jun 04, 2016  1306 Ultrasound shows possible early cirrhosis but not definitive. Also questionable of retrograde portal vein flow. Patient does have history of hepatitis could have some underlying cirrhosis. Will need a Doppler ultrasound to further evaluate. CT imaging unable to be obtained right now due to his acute renal failure.  [PR]    Clinical Course User Index [PR] Willy EddyPatrick Rita Vialpando, MD   I spoke with Dr. Meryl DareSinnott name of hospitalist group who agrees to admit patient for further evaluation and management of sepsis and hypotension. Blood pressure has improved with IV fluids resuscitation. Patient has received IV antibiotics. Satting well on 2 L.  Have discussed with the patient and available family all diagnostics and treatments performed thus far and all questions were answered to the best of my ability. The patient demonstrates understanding and agreement with plan.   ____________________________________________   FINAL CLINICAL IMPRESSION(S) / ED DIAGNOSES  Final diagnoses:  Pain  Acute respiratory failure with hypoxia (HCC)  Sepsis, due to unspecified organism (HCC)  Healthcare-associated pneumonia  Transaminitis      NEW MEDICATIONS STARTED DURING THIS VISIT:  New Prescriptions   No  medications on file     Note:  This document was prepared using Dragon voice recognition software and may include unintentional dictation errors.    Willy EddyPatrick Nazly Digilio, MD 06/04/16 [phone]

## 2016-06-04 NOTE — ED Notes (Signed)
Dr. Robinson notified of troponin 0.03. 

## 2016-06-04 NOTE — ED Notes (Signed)
Pt still not able to come off bipap. No ICU beds at this time. Pt remains in ED and in NAD.

## 2016-06-04 NOTE — ED Notes (Addendum)
FIRST NURSE NOTE: Pt reports feeling lethargic and no energy. Pt reports he has been feeling sluggish for the past 3-4 days.  Pt appears pale. Denies any pain at this time.

## 2016-06-04 NOTE — ED Notes (Signed)
Pt removed off bipap to eat. Pt placed on 4L and able to hold oxygen stats in the 90's. No SOB noted and not resp. Distress noted at this time.

## 2016-06-04 NOTE — ED Notes (Signed)
Dr. Roxan Hockeyobinson notified of lactic acid 2.0

## 2016-06-04 NOTE — ED Notes (Signed)
MD verbalized pt needed Bipap. Pt no longer a candidate for bed placement on unit. Unit called and informed and house supervisor called and informed. No ICU or stepdown beds available. MD verbalized wanting pts pCO2 below 50 and pH of 7.35 before pt can be taken off bipap. Pt stable at this time.

## 2016-06-04 NOTE — ED Triage Notes (Signed)
Patient arrives from home via POV with complaint of generalized weakness, slurred speech. During triage assessment, patient noted to be pale appearing with hypoxia, pale mucous membranes, and hypotensive.

## 2016-06-04 NOTE — Progress Notes (Signed)
Pt. Noted to be in Acute resp. Failure with Hypercarbia.  Will start Bipap.  Repeat ABG in 2 hrs.

## 2016-06-05 LAB — BLOOD GAS, ARTERIAL
ACID-BASE EXCESS: 8 mmol/L — AB (ref 0.0–2.0)
BICARBONATE: 36.4 mmol/L — AB (ref 20.0–28.0)
Delivery systems: POSITIVE
FIO2: 0.4
Inspiratory PAP: 16
MECHANICAL RATE: 10
O2 Saturation: 97.9 %
PATIENT TEMPERATURE: 37
PH ART: 7.33 — AB (ref 7.350–7.450)
pCO2 arterial: 69 mmHg (ref 32.0–48.0)
pO2, Arterial: 109 mmHg — ABNORMAL HIGH (ref 83.0–108.0)

## 2016-06-05 LAB — COMPREHENSIVE METABOLIC PANEL
ALBUMIN: 2.6 g/dL — AB (ref 3.5–5.0)
ALK PHOS: 158 U/L — AB (ref 38–126)
ALK PHOS: 163 U/L — AB (ref 38–126)
ALT: 218 U/L — AB (ref 17–63)
ALT: 192 U/L — AB (ref 17–63)
ANION GAP: 6 (ref 5–15)
AST: 158 U/L — ABNORMAL HIGH (ref 15–41)
AST: 108 U/L — ABNORMAL HIGH (ref 15–41)
Albumin: 2.6 g/dL — ABNORMAL LOW (ref 3.5–5.0)
Anion gap: 7 (ref 5–15)
BUN: 33 mg/dL — ABNORMAL HIGH (ref 6–20)
BUN: 34 mg/dL — ABNORMAL HIGH (ref 6–20)
CALCIUM: 8.3 mg/dL — AB (ref 8.9–10.3)
CALCIUM: 8.4 mg/dL — AB (ref 8.9–10.3)
CHLORIDE: 96 mmol/L — AB (ref 101–111)
CO2: 33 mmol/L — AB (ref 22–32)
CO2: 31 mmol/L (ref 22–32)
CREATININE: 1.67 mg/dL — AB (ref 0.61–1.24)
Chloride: 94 mmol/L — ABNORMAL LOW (ref 101–111)
Creatinine, Ser: 1.64 mg/dL — ABNORMAL HIGH (ref 0.61–1.24)
GFR calc Af Amer: 52 mL/min — ABNORMAL LOW (ref >60)
GFR calc non Af Amer: 45 mL/min — ABNORMAL LOW (ref >60)
GFR, EST AFRICAN AMERICAN: 51 mL/min — AB (ref >60)
GFR, EST NON AFRICAN AMERICAN: 44 mL/min — AB (ref >60)
GLUCOSE: 123 mg/dL — AB (ref 65–99)
Glucose, Bld: 127 mg/dL — ABNORMAL HIGH (ref 65–99)
Potassium: 4.5 mmol/L (ref 3.5–5.1)
Potassium: 4.1 mmol/L (ref 3.5–5.1)
SODIUM: 135 mmol/L (ref 135–145)
Sodium: 132 mmol/L — ABNORMAL LOW (ref 135–145)
TOTAL PROTEIN: 7.5 g/dL (ref 6.5–8.1)
Total Bilirubin: 0.9 mg/dL (ref 0.3–1.2)
Total Bilirubin: 0.7 mg/dL (ref 0.3–1.2)
Total Protein: 7 g/dL (ref 6.5–8.1)

## 2016-06-05 LAB — CBC
HCT: 35.7 % — ABNORMAL LOW (ref 40.0–52.0)
HEMOGLOBIN: 12.4 g/dL — AB (ref 13.0–18.0)
MCH: 35.9 pg — ABNORMAL HIGH (ref 26.0–34.0)
MCHC: 34.7 g/dL (ref 32.0–36.0)
MCV: 103.6 fL — ABNORMAL HIGH (ref 80.0–100.0)
PLATELETS: 228 10*3/uL (ref 150–440)
RBC: 3.45 MIL/uL — AB (ref 4.40–5.90)
RDW: 16.4 % — ABNORMAL HIGH (ref 11.5–14.5)
WBC: 8.4 10*3/uL (ref 3.8–10.6)

## 2016-06-05 LAB — GLUCOSE, CAPILLARY: Glucose-Capillary: 149 mg/dL — ABNORMAL HIGH (ref 65–99)

## 2016-06-05 LAB — MRSA PCR SCREENING: MRSA by PCR: NEGATIVE

## 2016-06-05 LAB — AMMONIA: Ammonia: 34 umol/L (ref 9–35)

## 2016-06-05 NOTE — Progress Notes (Signed)
[facility] [place] at [facility]   PATIENT NAME: Miguel ResidesDaniel Pigue    MR#:  [id number]  DATE OF BIRTH:  12/07/1955  SUBJECTIVE:  CHIEF COMPLAINT:  Patient return of breath is better. We will discontinue BiPAP and try oxygen via nasal cannula  REVIEW OF SYSTEMS:  CONSTITUTIONAL: No fever, fatigue or weakness.  EYES: No blurred or double vision.  EARS, NOSE, AND THROAT: No tinnitus or ear pain.  RESPIRATORY: Reports dry cough, denies shortness of breath while resting, denies wheezing or hemoptysis.  CARDIOVASCULAR: No chest pain, orthopnea, edema.  GASTROINTESTINAL: No nausea, vomiting, diarrhea or abdominal pain.  GENITOURINARY: No dysuria, hematuria.  ENDOCRINE: No polyuria, nocturia,  HEMATOLOGY: No anemia, easy bruising or bleeding SKIN: No rash or lesion. MUSCULOSKELETAL: No joint pain or arthritis.   NEUROLOGIC: No tingling, numbness, weakness.  PSYCHIATRY: No anxiety or depression.   DRUG ALLERGIES:  No Known Allergies  VITALS:  Blood pressure (!) 91/53, pulse 70, temperature 99 F (37.2 C), temperature source Axillary, resp. rate (!) 24, height 5\' 9"  (1.753 m), weight 100.9 kg (222 lb 7.1 oz), SpO2 93 %.  PHYSICAL EXAMINATION:  GENERAL:  61 y.o.-year-old patient lying in the bed with no acute distress.  EYES: Pupils equal, round, reactive to light and accommodation. No scleral icterus. Extraocular muscles intact.  HEENT: Head atraumatic, normocephalic. Oropharynx and nasopharynx clear.  NECK:  Supple, no jugular venous distention. No thyroid enlargement, no tenderness.  LUNGS: Moderate breath sounds bilaterally, no wheezing, rales,rhonchi or crepitation. No use of accessory muscles of respiration.  CARDIOVASCULAR: S1, S2 normal. No murmurs, rubs, or gallops.  ABDOMEN: Soft, nontender, nondistended. Bowel sounds present. No organomegaly or mass.  EXTREMITIES: No pedal edema, cyanosis, or clubbing.  NEUROLOGIC: Cranial nerves II through XII are  intact. Muscle strength 5/5 in all extremities. Sensation intact. Gait not checked.  PSYCHIATRIC: The patient is alert and oriented x 3.  SKIN: No obvious rash, lesion, or ulcer.    LABORATORY PANEL:   CBC  Recent Labs Lab 06/05/16 0303  WBC 8.4  HGB 12.4*  HCT 35.7*  PLT 228   ------------------------------------------------------------------------------------------------------------------  Chemistries   Recent Labs Lab 06/05/16 0303  NA 135  K 4.5  CL 96*  CO2 33*  GLUCOSE 123*  BUN 33*  CREATININE 1.64*  CALCIUM 8.3*  AST 158*  ALT 218*  ALKPHOS 158*  BILITOT 0.9   ------------------------------------------------------------------------------------------------------------------  Cardiac Enzymes  Recent Labs Lab 06/04/16 2223  TROPONINI <0.03   ------------------------------------------------------------------------------------------------------------------  RADIOLOGY:  Dg Chest Portable 1 View  Result Date: 06/04/2016 CLINICAL DATA:  Patient arrives from home via POV with complaint of generalized weakness x 3-4 days, slurred speech. During triage assessment, patient noted to be pale appearing with hypoxia, pale mucous membranes, and hypotensive. Hx/o COPD and HTN. Former smoker. EXAM: PORTABLE CHEST 1 VIEW COMPARISON:  03/24/2016 FINDINGS: Heart size is normal. No focal consolidations. There are slightly prominent markings at the bases, right greater than left raising the question of shallow inflation versus early infiltrate. Consider follow-up as needed to evaluate for infiltrate. IMPRESSION: Question early infiltrate at the bases, right greater than left. Follow-up is recommended. Electronically Signed   By: Norva PavlovElizabeth  Brown M.D.   On: 06/04/2016 11:08   [place] Abdomen Limited Ruq  Result Date: 06/04/2016 CLINICAL DATA:  Abnormal liver enzymes.  Abdominal pain. EXAM: US ABDOMEN LIMITED - RIGHT UPPER QUADRANT COMPARISON:  10/02/2008 FINDINGS: Gallbladder:  Gallbladder wall is thickened with hypoechoic edema measuring 4.8 mm. Gallbladder sludge is  present. No pericholecystic fluid. No sonographic Murphy's sign. Common bile duct: Diameter: 2.5 mm Liver: No focal liver lesions are identified. Mildly increased echotexture of the liver. On Doppler evaluation of the portal vein, there is color Doppler evidence for bidirectional flow. IMPRESSION: 1. Gallbladder wall thickening and gallbladder sludge, nonspecific findings. No other evidence for acute cholecystitis. 2. Mildly echogenic liver. 3. Possible bidirectional flow within the portal vein. Consider dedicated Doppler evaluation of the portal vein to evaluate for portal venous hypertension. Electronically Signed   By: Norva Pavlov M.D.   On: 06/04/2016 12:21    EKG:   Orders placed or performed during the hospital encounter of 06/04/16  . ED EKG  . ED EKG  . EKG 12-Lead  . EKG 12-Lead    ASSESSMENT AND PLAN:   61 year old male with past medical history of COPD, COPD, history of CHF, anxiety who presents to the hospital due to weakness lethargy and noted to be in acute respiratory failure with hypoxia.  1. Acute respiratory failure with hypoxia-secondary to COPD exacerbation and pneumonia -Clinically better. Off BiPAP -Continue O2 supplementation, wean O2 as tolerated. -Continue IV steroids, scheduled DuoNeb's, Pulmicort nebs.  2. COPD exacerbation-secondary to pneumonia. -on IV steroids, scheduled DuoNeb's, Pulmicort nebs. - IV ceftriaxone, Zithromax.  3. Pneumonia-community-acquired - IV ceftriaxone Zithromax, follow blood, sputum cultures.  4. Lethargy/weakness-could be from underlying pneumonia, aki  and COPD exacerbation patient is more awake and alert today Ammonia 34   5. Abnormal LFTs-secondary to alcohol abuse as patient has had multiple mixed drinks over the past holidays. -Limited abdominal ultrasound showing no acute pathology. We'll follow LFTs.  6. GERD-continue  Pepcid.  7. Acute kidney injury-we'll give the patient gentle IV fluids, hold Lasix, lisinopril. -Follow BUN and creatinine.  creatinine 2.02--1.6   PT consult  All the records are reviewed and case discussed with Care Management/Social Workerr. Management plans discussed with the patient, family and they are in agreement.  CODE STATUS: fc   TOTAL TIME TAKING CARE OF THIS PATIENT: 37 minutes.   POSSIBLE D/C IN 2  DAYS, DEPENDING ON CLINICAL CONDITION.  Note: This dictation was prepared with Dragon dictation along with smaller phrase technology. Any transcriptional errors that result from this process are unintentional.   Ramonita Lab M.D on 06/05/2016 at 1:57 PM  Between 7am to 6pm - Pager - [phone] After 6pm go to [email or web address] - password [email or web address] [facility]  [facility]  Office  [phone]  CC: Primary care physician; Marguarite Arbour, MD

## 2016-06-05 NOTE — Evaluation (Signed)
Physical Therapy Evaluation Patient Details Name: Miguel Howell MRN: [id number] DOB: 1955/12/27 Today's Date: 06/05/2016   History of Present Illness  Patient is a 61 y/o male that presents with acute respiratory failure, admitted to ICU.   Clinical Impression  Patient admitted for acute respiratory failure, also noted to have hypotension. He is typically independent at baseline and works part time at [employer]. He is able to transfer independently, and ambulate short household distance prior to O2 sats declining. After rest break he is able to maintain his O2 sats on 2nd bout of ambulation, no loss of balance noted. It appears he has more limitations from cardiopulmonary deficits currently than muscle strength in terms of ambulation tolerance. He is otherwise progressing well towards goal of independent mobility, though will need intensive reconditioning program either at home or outpatient facility.     Follow Up Recommendations Home health PT/Cardiopulmonary rehab    Equipment Recommendations       Recommendations for Other Services       Precautions / Restrictions Precautions Precautions: Fall Restrictions Weight Bearing Restrictions: No      Mobility  Bed Mobility Overal bed mobility: Modified Independent             General bed mobility comments: Patient requires no assistance to transfer supine to sit with HOB elevated.   Transfers Overall transfer level: Needs assistance   Transfers: Sit to/from Stand Sit to Stand: Supervision         General transfer comment: No loss of balance, slower than anticipated transfer.   Ambulation/Gait Ambulation/Gait assistance: Supervision Ambulation Distance (Feet): 80 Feet   Gait Pattern/deviations: WFL(Within Functional Limits)   Gait velocity interpretation: <1.8 ft/sec, indicative of risk for recurrent falls General Gait Details: Patient ambulates with slow gait pattern, short steps and decreased cadence. Noted to  decrease on O2 sats to 85% on 4L during first bout of ambulation, recovered quickly with rest.   Stairs            Wheelchair Mobility    Modified Rankin (Stroke Patients Only)       Balance Overall balance assessment: Needs assistance Sitting-balance support: No upper extremity supported Sitting balance-Leahy Scale: Good     Standing balance support: No upper extremity supported Standing balance-Leahy Scale: Good                               Pertinent Vitals/Pain Pain Assessment: No/denies pain    Home Living Family/patient expects to be discharged to:: Private residence Living Arrangements: Alone   Type of Home: House Home Access: Stairs to enter   Secretary/administratorntrance Stairs-Number of Steps: 1 Home Layout: One level Home Equipment: None      Prior Function Level of Independence: Independent         Comments: Patient still works at [employer] part time.      Hand Dominance        Extremity/Trunk Assessment   Upper Extremity Assessment Upper Extremity Assessment: Overall WFL for tasks assessed    Lower Extremity Assessment Lower Extremity Assessment: Overall WFL for tasks assessed       Communication   Communication: No difficulties  Cognition Arousal/Alertness: Awake/alert Behavior During Therapy: WFL for tasks assessed/performed Overall Cognitive Status: Within Functional Limits for tasks assessed                      General Comments  Exercises Other Exercises Other Exercises: Completed second bout of 125' with no AD and 4L of O2, sats remained above 95% no loss of balance and improved gait speed noted.    Assessment/Plan    PT Assessment Patient needs continued PT services  PT Problem List Decreased strength;Decreased balance;Cardiopulmonary status limiting activity;Decreased activity tolerance          PT Treatment Interventions Gait training;DME instruction;Therapeutic activities;Balance  training;Patient/family education;Stair training;Therapeutic exercise    PT Goals (Current goals can be found in the Care Plan section)  Acute Rehab PT Goals Patient Stated Goal: To return home safely  PT Goal Formulation: With patient Time For Goal Achievement: 06/19/16 Potential to Achieve Goals: Good    Frequency Min 2X/week   Barriers to discharge        Co-evaluation               End of Session Equipment Utilized During Treatment: Gait belt;Oxygen Activity Tolerance: Patient tolerated treatment well Patient left: in chair;with call bell/phone within reach Nurse Communication: Mobility status         Time: 5409-8119 PT Time Calculation (min) (ACUTE ONLY): 30 min   Charges:   PT Evaluation $PT Eval Moderate Complexity: 1 Procedure PT Treatments $Gait Training: 8-22 mins   PT G Codes:       Kerin Ransom, PT, DPT    06/05/2016, 5:30 PM

## 2016-06-06 LAB — COMPREHENSIVE METABOLIC PANEL WITH GFR
ALT: 161 U/L — ABNORMAL HIGH (ref 17–63)
AST: 61 U/L — ABNORMAL HIGH (ref 15–41)
Albumin: 2.6 g/dL — ABNORMAL LOW (ref 3.5–5.0)
Alkaline Phosphatase: 159 U/L — ABNORMAL HIGH (ref 38–126)
Anion gap: 4 — ABNORMAL LOW (ref 5–15)
BUN: 44 mg/dL — ABNORMAL HIGH (ref 6–20)
CO2: 30 mmol/L (ref 22–32)
Calcium: 8.8 mg/dL — ABNORMAL LOW (ref 8.9–10.3)
Chloride: 99 mmol/L — ABNORMAL LOW (ref 101–111)
Creatinine, Ser: 1.51 mg/dL — ABNORMAL HIGH (ref 0.61–1.24)
GFR calc Af Amer: 57 mL/min — ABNORMAL LOW
GFR calc non Af Amer: 49 mL/min — ABNORMAL LOW
Glucose, Bld: 178 mg/dL — ABNORMAL HIGH (ref 65–99)
Potassium: 4.5 mmol/L (ref 3.5–5.1)
Sodium: 133 mmol/L — ABNORMAL LOW (ref 135–145)
Total Bilirubin: 0.6 mg/dL (ref 0.3–1.2)
Total Protein: 7.4 g/dL (ref 6.5–8.1)

## 2016-06-06 LAB — CBC WITH DIFFERENTIAL/PLATELET
Basophils Absolute: 0 K/uL (ref 0–0.1)
Basophils Relative: 0 %
Eosinophils Absolute: 0 K/uL (ref 0–0.7)
Eosinophils Relative: 0 %
HCT: 39.4 % — ABNORMAL LOW (ref 40.0–52.0)
Hemoglobin: 13.1 g/dL (ref 13.0–18.0)
Lymphocytes Relative: 5 %
Lymphs Abs: 0.5 K/uL — ABNORMAL LOW (ref 1.0–3.6)
MCH: 35.3 pg — ABNORMAL HIGH (ref 26.0–34.0)
MCHC: 33.2 g/dL (ref 32.0–36.0)
MCV: 106.4 fL — ABNORMAL HIGH (ref 80.0–100.0)
Monocytes Absolute: 0.3 K/uL (ref 0.2–1.0)
Monocytes Relative: 3 %
Neutro Abs: 8.3 K/uL — ABNORMAL HIGH (ref 1.4–6.5)
Neutrophils Relative %: 92 %
Platelets: 257 K/uL (ref 150–440)
RBC: 3.71 MIL/uL — ABNORMAL LOW (ref 4.40–5.90)
RDW: 16.2 % — ABNORMAL HIGH (ref 11.5–14.5)
WBC: 9.1 K/uL (ref 3.8–10.6)

## 2016-06-06 LAB — URINE CULTURE: Culture: NO GROWTH

## 2016-06-06 MED ORDER — SODIUM CHLORIDE 0.9 % IV SOLN: INTRAVENOUS | Status: DC

## 2016-06-06 MED ORDER — GUAIFENESIN-DM 100-10 MG/5ML PO SYRP
10.0000 mL | ORAL_SOLUTION | Freq: Four times a day (QID) | ORAL | Status: DC | PRN
  Administered 2016-06-06 (×2): 10 mL via ORAL
  Filled 2016-06-06 (×2): qty 10

## 2016-06-06 MED ORDER — MENTHOL 3 MG MT LOZG
1.0000 | LOZENGE | OROMUCOSAL | Status: DC | PRN
  Administered 2016-06-07: 3 mg via ORAL
  Filled 2016-06-06: qty 9

## 2016-06-06 NOTE — Progress Notes (Signed)
SATURATION QUALIFICATIONS: (This note is used to comply with regulatory documentation for home oxygen)  Patient Saturations on Room Air at Rest = 84   Please briefly explain why patient needs home oxygen:  Patient oxygen saturations are less than 88% on room air  Patient oxygen saturation is 91% on 4L

## 2016-06-06 NOTE — Progress Notes (Signed)
[facility] - [place] at [facility]   PATIENT NAME: Miguel Howell    MR#:  [id number]  DATE OF BIRTH:  11-13-1955  SUBJECTIVE:  CHIEF COMPLAINT:  Patient Shortness of breath is better. Ambulatory pulse ox dropped down to 84% while resting per RN  REVIEW OF SYSTEMS:  CONSTITUTIONAL: No fever, fatigue or weakness.  EYES: No blurred or double vision.  EARS, NOSE, AND THROAT: No tinnitus or ear pain.  RESPIRATORY: Reports dry cough, denies shortness of breath while resting, denies wheezing or hemoptysis.  CARDIOVASCULAR: No chest pain, orthopnea, edema.  GASTROINTESTINAL: No nausea, vomiting, diarrhea or abdominal pain.  GENITOURINARY: No dysuria, hematuria.  ENDOCRINE: No polyuria, nocturia,  HEMATOLOGY: No anemia, easy bruising or bleeding SKIN: No rash or lesion. MUSCULOSKELETAL: No joint pain or arthritis.   NEUROLOGIC: No tingling, numbness, weakness.  PSYCHIATRY: No anxiety or depression.   DRUG ALLERGIES:  No Known Allergies  VITALS:  Blood pressure (!) 114/56, pulse 63, temperature 97.5 F (36.4 C), temperature source Oral, resp. rate 16, height 5\' 9"  (1.753 m), weight 100.9 kg (222 lb 7.1 oz), SpO2 96 %.  PHYSICAL EXAMINATION:  GENERAL:  61 y.o.-year-old patient lying in the bed with no acute distress.  EYES: Pupils equal, round, reactive to light and accommodation. No scleral icterus. Extraocular muscles intact.  HEENT: Head atraumatic, normocephalic. Oropharynx and nasopharynx clear.  NECK:  Supple, no jugular venous distention. No thyroid enlargement, no tenderness.  LUNGS: Moderate breath sounds bilaterally, no wheezing, rales,rhonchi or crepitation. No use of accessory muscles of respiration.  CARDIOVASCULAR: S1, S2 normal. No murmurs, rubs, or gallops.  ABDOMEN: Soft, nontender, nondistended. Bowel sounds present. No organomegaly or mass.  EXTREMITIES: No pedal edema, cyanosis, or clubbing.  NEUROLOGIC: Cranial nerves II through XII are  intact. Muscle strength 5/5 in all extremities. Sensation intact. Gait not checked.  PSYCHIATRIC: The patient is alert and oriented x 3.  SKIN: No obvious rash, lesion, or ulcer.    LABORATORY PANEL:   CBC  Recent Labs Lab 06/06/16 0447  WBC 9.1  HGB 13.1  HCT 39.4*  PLT 257   ------------------------------------------------------------------------------------------------------------------  Chemistries   Recent Labs Lab 06/05/16 1935  NA 132*  K 4.1  CL 94*  CO2 31  GLUCOSE 127*  BUN 34*  CREATININE 1.67*  CALCIUM 8.4*  AST 108*  ALT 192*  ALKPHOS 163*  BILITOT 0.7   ------------------------------------------------------------------------------------------------------------------  Cardiac Enzymes  Recent Labs Lab 06/04/16 2223  TROPONINI <0.03   ------------------------------------------------------------------------------------------------------------------  RADIOLOGY:  No results found.  EKG:   Orders placed or performed during the hospital encounter of 06/04/16  . ED EKG  . ED EKG  . EKG 12-Lead  . EKG 12-Lead    ASSESSMENT AND PLAN:   61 year old male with past medical history of COPD, COPD, history of CHF, anxiety who presents to the hospital due to weakness lethargy and noted to be in acute respiratory failure with hypoxia.  1. Acute respiratory failure with hypoxia-secondary to COPD exacerbation and pneumonia -Clinically better. Off BiPAP -Continue O2 supplementation, wean O2 as tolerated. -Continue TAPER IV steroids, scheduled DuoNeb's, Pulmicort nebs.  2. COPD exacerbation-secondary to pneumonia. -on IV steroids, scheduled DuoNeb's, Pulmicort nebs. - IV ceftriaxone, Zithromax.  3. Pneumonia-community-acquired - IV ceftriaxone Zithromax, follow blood, sputum cultures.  4. Lethargy/weakness-could be from underlying pneumonia, aki  and COPD exacerbation patient is more awake and alert today,PT seen pt Ammonia 34   5. Abnormal  LFTs-secondary to alcohol abuse as patient has had multiple  mixed drinks over the past holidays. -Limited abdominal ultrasound showing no acute pathology. LFTs improving  6. GERD-continue Pepcid.  7. Acute kidney injury- hold Lasix, lisinopril. -Follow BUN and creatinine.  creatinine 2.02--1.6--1.67 provide gentle hydration and repeat BMP in a.m.;   PT consultRecommending home health PT  All the records are reviewed and case discussed with Care Management/Social Workerr. Management plans discussed with the patient, family and they are in agreement.  CODE STATUS: fc   TOTAL TIME TAKING CARE OF THIS PATIENT: 37 minutes.   POSSIBLE D/C IN 2  DAYS, DEPENDING ON CLINICAL CONDITION.  Note: This dictation was prepared with Dragon dictation along with smaller phrase technology. Any transcriptional errors that result from this process are unintentional.   Ramonita LabGouru, Dayden Viverette M.D on 06/06/2016 at 3:30 PM  Between 7am to 6pm - Pager - [phone] After 6pm go to [email or web address] - password [email or web address] [facility]  [facility]  Office  [phone]  CC: Primary care physician; Marguarite ArbourSPARKS,JEFFREY D, MD

## 2016-06-07 MED ORDER — PREDNISONE 10 MG (21) PO TBPK: 10.0000 mg | ORAL_TABLET | Freq: Every day | ORAL | 0 refills | Status: DC

## 2016-06-07 MED ORDER — MENTHOL 3 MG MT LOZG: 1.0000 | LOZENGE | OROMUCOSAL | 12 refills | Status: DC | PRN

## 2016-06-07 MED ORDER — SENNA 8.6 MG PO TABS: 1.0000 | ORAL_TABLET | Freq: Every day | ORAL | 0 refills | Status: DC | PRN

## 2016-06-07 MED ORDER — AMOXICILLIN-POT CLAVULANATE 875-125 MG PO TABS: 1.0000 | ORAL_TABLET | Freq: Two times a day (BID) | ORAL | 0 refills | Status: DC

## 2016-06-07 MED ORDER — GUAIFENESIN-DM 100-10 MG/5ML PO SYRP: 10.0000 mL | ORAL_SOLUTION | Freq: Four times a day (QID) | ORAL | 0 refills | Status: DC | PRN

## 2016-06-07 MED ORDER — LACTULOSE 10 GM/15ML PO SOLN
20.0000 g | Freq: Every day | ORAL | Status: DC | PRN
  Filled 2016-06-07: qty 30

## 2016-06-07 NOTE — Progress Notes (Signed)
No BM since 12/31.  Patient has a history of CHF.  IVF to be discontinued and lactulose ordered for BM

## 2016-06-07 NOTE — Progress Notes (Signed)
Patient has not had a BM since 12/31.  He refused lactulose and said he would wait until he got home

## 2016-06-07 NOTE — Progress Notes (Signed)
Pt noted to desat while sleeping to 83-84%, nonsustained. Pt also mildly confused upon awakening, but quickly becomes clear and fully oriented. Pt reports a history of sleep apnea, however he did not follow up with sleep study. Dr Amado CoeGouru notified of above details. Sats are currently 95% on 4L without distress.

## 2016-06-07 NOTE — Progress Notes (Signed)
Discharge instructions were reviewed with the pt.  He was in a hurry to get out of the hospital because he said his ride was downstairs waiting on him.  rx put in white belongings bag.  Pt sent out via wheelchair.  Oxygen on patient and pt sent with 2 tanks and a walker that were both provided for him

## 2016-06-07 NOTE — Progress Notes (Signed)
SATURATION QUALIFICATIONS: (This note is used to comply with regulatory documentation for home oxygen)  Patient Saturations on Room Air at Rest = 86%  Patient Saturations on Room Air while Ambulating =   Patient Saturations on  Liters of oxygen while Ambulating = 88% on 4L without the walker then 93% on 4 L with the walker Please briefly explain why patient needs home oxygen:  Patient desats when on room air and when ambulating

## 2016-06-07 NOTE — Discharge Summary (Signed)
[facility] - [place] at [facility]   PATIENT NAME: Miguel Howell    MR#:  [id number]  DATE OF BIRTH:  05-01-1956  DATE OF ADMISSION:  06/04/2016 ADMITTING PHYSICIAN: Houston Siren, MD  DATE OF DISCHARGE: 06/07/16 PRIMARY CARE PHYSICIAN: SPARKS,JEFFREY D, MD    ADMISSION DIAGNOSIS:  Pain [R52] Transaminitis [R74.0] Healthcare-associated pneumonia [J18.9] Acute respiratory failure with hypoxia (HCC) [J96.01] Sepsis, due to unspecified organism (HCC) [A41.9]  DISCHARGE DIAGNOSIS:  Active Problems:   COPD exacerbation (HCC) Healthcare associated pneumonia Transaminitis On-call abuse Acute hypoxic respiratory failure needing 4 L of oxygen   SECONDARY DIAGNOSIS:   Past Medical History:  Diagnosis Date  . Asthma   . COPD (chronic obstructive pulmonary disease) (HCC)   . Hypertension     HOSPITAL COURSE:  Brief history and physical- Miguel Howell  is a 61 y.o. male with a known history of COPD, hypertension, questionable history of hepatitis B who presents to the hospital due to generalized weakness and lethargy. Patient says that people at work noticed that he was more lethargic than usual and for the past few days she's also been feeling short of breath and has a cough that has been productive of green-yellow sputum. He therefore came to the ER for further evaluation and was noted to be in acute respiratory failure with hypoxia secondary to COPD exacerbation. His chest x-ray was suggestive of pneumonia. He was also noted to be in acute kidney injury and also noted to have abnormal LFTs and therefore hospitalist services were contacted further treatment and evaluation. Patient denies any chest pains, abdominal pains, nausea vomiting diarrhea or any other associated symptoms presently. Please review history and physical for complete details  Hospital course  . Acute respiratory failure with hypoxia-secondary to COPD exacerbation and pneumonia -Clinically  better. Off BiPAP -Continue O2 supplementation, patient is desaturating with ambulation and needing 4 L of oxygen via nasal cannula to go home discussed with social worker -Continue TAPER IV steroids to by mouth prednisone, scheduled DuoNeb's, Pulmicort nebs.  2. COPD exacerbation-secondary to pneumonia. -on IV steroids, scheduled DuoNeb's, Pulmicort nebs. - IV ceftriaxone, Zithromax were given during the hospital course. Patient clinically improved. We will discharge him home with by mouth Augmentin  3. Pneumonia-community-acquired - IV ceftriaxone Zithromax, follow blood cultures negative for 3 days, sputum sample not collected  4. Lethargy/weakness-could be from underlying pneumonia, aki  and COPD exacerbation patient is more awake and alert today,PT seen pt-home health physical therapy ambulate with walker Ammonia 34   5. Abnormal LFTs-secondary to alcohol abuse as patient has had multiple mixed drinks over the past holidays. -Limited abdominal ultrasound showing no acute pathology. LFTs improving -Outpatient follow-up with primary care physician. PCP to monitor -Recommended to quit drinking  6. GERD-continue Pepcid.  7. Acute kidney injury- hold Lasix, lisinopril, blood pressure is soft, PCP to follow up and resume medications if blood pressure is better and renal function improved -Follow BUN and creatinine.  creatinine 2.02--1.6--1.67- 1.51 provided gentle hydration   8. Multiple episodes of nocturnal hypoxia probably from obstructive sleep apnea Patient needs outpatient sleep study Continue oxygen via nasal cannul PCP to arrange outpatient sleep studya-   PT consult recommending home health PT, RW    PAST MEDICAL HISTORY:  gerd Alcohol abuse   DISCHARGE CONDITIONS:   fair  CONSULTS OBTAINED:     PROCEDURES  None   DRUG ALLERGIES:  No Known Allergies  DISCHARGE MEDICATIONS:   Current Discharge Medication List  START taking these medications    Details  amoxicillin-clavulanate (AUGMENTIN) 875-125 MG tablet Take 1 tablet by mouth 2 (two) times daily. Qty: 14 tablet, Refills: 0    guaiFENesin-dextromethorphan (ROBITUSSIN DM) 100-10 MG/5ML syrup Take 10 mLs by mouth every 6 (six) hours as needed for cough. Qty: 118 mL, Refills: 0    menthol-cetylpyridinium (CEPACOL) 3 MG lozenge Take 1 lozenge (3 mg total) by mouth as needed for sore throat. Qty: 100 tablet, Refills: 12    predniSONE (STERAPRED UNI-PAK 21 TAB) 10 MG (21) TBPK tablet Take 1 tablet (10 mg total) by mouth daily. Take 6 tablets by mouth for 1 day followed by  5 tablets by mouth for 1 day followed by  4 tablets by mouth for 1 day followed by  3 tablets by mouth for 1 day followed by  2 tablets by mouth for 1 day followed by  1 tablet by mouth for a day and stop Qty: 21 tablet, Refills: 0    senna (SENOKOT) 8.6 MG TABS tablet Take 1 tablet (8.6 mg total) by mouth daily as needed for mild constipation. Qty: 120 each, Refills: 0      CONTINUE these medications which have NOT CHANGED   Details  carvedilol (COREG) 6.25 MG tablet Take 6.25 mg by mouth 2 (two) times daily. Refills: 11    SPIRIVA HANDIHALER 18 MCG inhalation capsule Place 1 capsule into inhaler and inhale daily. Refills: 11    SYMBICORT 160-4.5 MCG/ACT inhaler Inhale 2 puffs into the lungs 2 (two) times daily. Refills: 5    LORazepam (ATIVAN) 1 MG tablet Take 1 mg by mouth at bedtime as needed for sleep. Refills: 5    potassium chloride SA (K-DUR,KLOR-CON) 20 MEQ tablet Take 2 tablets (40 mEq total) by mouth 2 (two) times daily. Qty: 12 tablet, Refills: 0    PROAIR HFA 108 (90 Base) MCG/ACT inhaler Inhale 2 puffs into the lungs every 4 (four) hours as needed for shortness of breath. Refills: 2    ranitidine (ZANTAC) 150 MG tablet Take 150 mg by mouth daily.      STOP taking these medications     furosemide (LASIX) 40 MG tablet      lisinopril (PRINIVIL,ZESTRIL) 20 MG tablet           DISCHARGE INSTRUCTIONS:  Follow-up with primary care physician in a week Patient needs outpatient sleep study and repeat BMP need to be considered by PCP Continue oxygen via nasal cannula  DIET:  Low-salt  DISCHARGE CONDITION:  Fair  ACTIVITY:  Activity as tolerated per PT  OXYGEN:  Home Oxygen: Yes.     Oxygen Delivery: 4 liters/min via Patient connected to nasal cannula oxygen  DISCHARGE LOCATION:  home   If you experience worsening of your admission symptoms, develop shortness of breath, life threatening emergency, suicidal or homicidal thoughts you must seek medical attention immediately by calling 911 or calling your MD immediately  if symptoms less severe.  You Must read complete instructions/literature along with all the possible adverse reactions/side effects for all the Medicines you take and that have been prescribed to you. Take any new Medicines after you have completely understood and accpet all the possible adverse reactions/side effects.   Please note  You were cared for by a hospitalist during your hospital stay. If you have any questions about your discharge medications or the care you received while you were in the hospital after you are discharged, you can call the unit and asked to speak  with the hospitalist on call if the hospitalist that took care of you is not available. Once you are discharged, your primary care physician will handle any further medical issues. Please note that NO REFILLS for any discharge medications will be authorized once you are discharged, as it is imperative that you return to your primary care physician (or establish a relationship with a primary care physician if you do not have one) for your aftercare needs so that they can reassess your need for medications and monitor your lab values.     Today  Chief Complaint  Patient presents with  . Weakness  . Aphasia   Patient is sleepy but arousable. Didn't sleep well last night  but wheezing is better. Comfortable to go home with oxygen  ROS:  CONSTITUTIONAL: Denies fevers, chills. Denies any fatigue, weakness.  EYES: Denies blurry vision, double vision, eye pain. EARS, NOSE, THROAT: Denies tinnitus, ear pain, hearing loss. RESPIRATORY: Denies cough, wheeze, shortness of breath.  CARDIOVASCULAR: Denies chest pain, palpitations, edema.  GASTROINTESTINAL: Denies nausea, vomiting, diarrhea, abdominal pain. Denies bright red blood per rectum. GENITOURINARY: Denies dysuria, hematuria. ENDOCRINE: Denies nocturia or thyroid problems. HEMATOLOGIC AND LYMPHATIC: Denies easy bruising or bleeding. SKIN: Denies rash or lesion. MUSCULOSKELETAL: Denies pain in neck, back, shoulder, knees, hips or arthritic symptoms.  NEUROLOGIC: Denies paralysis, paresthesias.  PSYCHIATRIC: Denies anxiety or depressive symptoms.   VITAL SIGNS:  Blood pressure (!) 105/58, pulse 61, temperature 97.7 F (36.5 C), temperature source Oral, resp. rate 16, height 5\' 9"  (1.753 m), weight 100.9 kg (222 lb 7.1 oz), SpO2 96 %.  I/O:    Intake/Output Summary (Last 24 hours) at 06/07/16 1421 Last data filed at 06/07/16 1131  Gross per 24 hour  Intake          4411.66 ml  Output              450 ml  Net          3961.66 ml    PHYSICAL EXAMINATION:  GENERAL:  61 y.o.-year-old patient lying in the bed with no acute distress.  EYES: Pupils equal, round, reactive to light and accommodation. No scleral icterus. Extraocular muscles intact.  HEENT: Head atraumatic, normocephalic. Oropharynx and nasopharynx clear.  NECK:  Supple, no jugular venous distention. No thyroid enlargement, no tenderness.  LUNGS:   moderate breath sounds bilaterally, no wheezing, rales,rhonchi or crepitation. No use of accessory muscles of respiration.  CARDIOVASCULAR: S1, S2 normal. No murmurs, rubs, or gallops.  ABDOMEN: Soft, non-tender, non-distended. Bowel sounds present. No organomegaly or mass.  EXTREMITIES: No pedal  edema, cyanosis, or clubbing.  NEUROLOGIC: Cranial nerves II through XII are intact. Muscle strength 5/5 in all extremities. Sensation intact. Gait not checked.  PSYCHIATRIC: The patient is alert and oriented x 3.  SKIN: No obvious rash, lesion, or ulcer.   DATA REVIEW:   CBC  Recent Labs Lab 06/06/16 0447  WBC 9.1  HGB 13.1  HCT 39.4*  PLT 257    Chemistries   Recent Labs Lab 06/06/16 1712  NA 133*  K 4.5  CL 99*  CO2 30  GLUCOSE 178*  BUN 44*  CREATININE 1.51*  CALCIUM 8.8*  AST 61*  ALT 161*  ALKPHOS 159*  BILITOT 0.6    Cardiac Enzymes  Recent Labs Lab 06/04/16 2223  TROPONINI <0.03    Microbiology Results  Results for orders placed or performed during the hospital encounter of 06/04/16  Blood Culture (routine x 2)  Status: None (Preliminary result)   Collection Time: 06/04/16 10:49 AM  Result Value Ref Range Status   Specimen Description BLOOD LEFT ARM  Final   Special Requests   Final    BOTTLES DRAWN AEROBIC AND ANAEROBIC  AER 9CC ANA 7CC   Culture NO GROWTH 3 DAYS  Final   Report Status PENDING  Incomplete  Blood Culture (routine x 2)     Status: None (Preliminary result)   Collection Time: 06/04/16 10:54 AM  Result Value Ref Range Status   Specimen Description BLOOD  RIGHT ARM  Final   Special Requests   Final    BOTTLES DRAWN AEROBIC AND ANAEROBIC  AER 9CC ANA 10CC   Culture NO GROWTH 3 DAYS  Final   Report Status PENDING  Incomplete  Urine culture     Status: None   Collection Time: 06/04/16  9:28 PM  Result Value Ref Range Status   Specimen Description URINE, RANDOM  Final   Special Requests NONE  Final   [facility] GR[facility]TH [facility]   Final   Report Status 06/06/2016 FINAL  Final  MRSA PCR Screening     Status: None   Collection Time: 06/04/16 10:50 PM  Result Value Ref Range Status   MRSA by PCR NEGATIVE NEGATIVE Final    Comment:        The GeneXpert MRSA Assay (FDA approved for NASAL  specimens only), is one component of a comprehensive MRSA colonization surveillance program. It is not intended to diagnose MRSA infection nor to guide or monitor treatment for MRSA infections.     RADIOLOGY:  Dg Chest Portable 1 View  Result Date: 06/04/2016 CLINICAL DATA:  Patient arrives from home via POV with complaint of generalized weakness x 3-4 days, slurred speech. During triage assessment, patient noted to be pale appearing with hypoxia, pale mucous membranes, and hypotensive. Hx/o COPD and HTN. Former smoker. EXAM: PORTABLE CHEST 1 VIEW COMPARISON:  03/24/2016 FINDINGS: Heart size is normal. No focal consolidations. There are slightly prominent markings at the bases, right greater than left raising the question of shallow inflation versus early infiltrate. Consider follow-up as needed to evaluate for infiltrate. IMPRESSION: Question early infiltrate at the bases, right greater than left. Follow-up is recommended. Electronically Signed   By: Norva PavlovElizabeth  Brown M.D.   On: 06/04/2016 11:08   [place] Abdomen Limited Ruq  Result Date: 06/04/2016 CLINICAL DATA:  Abnormal liver enzymes.  Abdominal pain. EXAM: US ABDOMEN LIMITED - RIGHT UPPER QUADRANT COMPARISON:  10/02/2008 FINDINGS: Gallbladder: Gallbladder wall is thickened with hypoechoic edema measuring 4.8 mm. Gallbladder sludge is present. No pericholecystic fluid. No sonographic Murphy's sign. Common bile duct: Diameter: 2.5 mm Liver: No focal liver lesions are identified. Mildly increased echotexture of the liver. On Doppler evaluation of the portal vein, there is color Doppler evidence for bidirectional flow. IMPRESSION: 1. Gallbladder wall thickening and gallbladder sludge, nonspecific findings. No other evidence for acute cholecystitis. 2. Mildly echogenic liver. 3. Possible bidirectional flow within the portal vein. Consider dedicated Doppler evaluation of the portal vein to evaluate for portal venous hypertension. Electronically Signed    By: Norva PavlovElizabeth  Brown M.D.   On: 06/04/2016 12:21    EKG:   Orders placed or performed during the hospital encounter of 06/04/16  . ED EKG  . ED EKG  . EKG 12-Lead  . EKG 12-Lead      Management plans discussed with the patient, family and they are in agreement.  CODE STATUS:  Code Status Orders        Start     Ordered   06/04/16 2249  Full code  Continuous     06/04/16 2248    Code Status History    Date Active Date Inactive Code Status Order ID Comments User Context   03/25/2016  2:46 AM 03/26/2016  3:18 PM Full Code [id number]  Oralia Manis, MD Inpatient      TOTAL TIME TAKING CARE OF THIS PATIENT: 45 minutes.   Note: This dictation was prepared with Dragon dictation along with smaller phrase technology. Any transcriptional errors that result from this process are unintentional.   @MEC @  on 06/07/2016 at 2:21 PM  Between 7am to 6pm - Pager - [phone]  After 6pm go to [email or web address] - password [email or web address] [facility]  [place]  [facility]ospital[facility]  Office  [phone]  CC: Primary care physician; Marguarite Arbour, MD

## 2016-06-07 NOTE — Care Management (Signed)
Patient discharged home today.  Patient self pay.  Patient lives at home alone.  PCP dr sparks. Pharmacy [facility].  Patient requiring acute O2 4 liters.  Order for RW, RN and PT.  MD aware of desaturation of O2, and PT recommendation for home health PT.  [facility] able to provide charity RN, RW and OT.  The [employer] has approved funding for up to 4 PT visits. Patient states that he plans on staying by himself tonight, however has friends that will stay with him if he feels like he needs them to.

## 2016-06-07 NOTE — Progress Notes (Signed)
Physical Therapy Treatment Patient Details Name: CONWAY FEDORA MRN: [id number] DOB: [birth year] Today's Date: 06/07/2016    History of Present Illness Patient is a 61 y/o male that presents with acute respiratory failure, admitted to ICU.     PT Comments    Pt is making good progress towards goals with improved mobility this date. Pt very unsteady during ambulation without AD, with multiple LOB noted. Further ambulation performed with use of RW with improved balance, recommend continued use of RW for home use. Pt performed all mobility on 4L of O2, qualified for home O2 use. Pt motivated to perform therapy, however sleepy during treatment.  Follow Up Recommendations  Home health PT     Equipment Recommendations  Rolling walker with 5" wheels    Recommendations for Other Services       Precautions / Restrictions Precautions Precautions: Fall Restrictions Weight Bearing Restrictions: No    Mobility  Bed Mobility Overal bed mobility: Modified Independent             General bed mobility comments: Patient requires no assistance to transfer supine to sit with HOB elevated.   Transfers Overall transfer level: Needs assistance Equipment used: None Transfers: Sit to/from Stand Sit to Stand: Min guard         General transfer comment: assist required for pushing from seated surface. Pt very unsteady once standing. 2nd attempt performed with RW with improved balance.  Ambulation/Gait Ambulation/Gait assistance: Min guard;Min assist Ambulation Distance (Feet): 40 Feet Assistive device: None Gait Pattern/deviations: Step-to pattern     General Gait Details: ambulated in room with unsteadiness noted and several near falls. Pt required min assist to maintain balance. Further ambulation performed with RW with improved balance, however still unsteady. All mobility performed on 4L of O2 with O2 sats decreasing to 88% without AD and 93% while using RW. Narrow BOS with difficulty  sequencing steps and using RW.   Stairs            Wheelchair Mobility    Modified Rankin (Stroke Patients Only)       Balance                                    Cognition Arousal/Alertness: Awake/alert Behavior During Therapy: WFL for tasks assessed/performed Overall Cognitive Status: Within Functional Limits for tasks assessed                      Exercises      General Comments        Pertinent Vitals/Pain Pain Assessment: No/denies pain    Home Living                      Prior Function            PT Goals (current goals can now be found in the care plan section) Acute Rehab PT Goals Patient Stated Goal: To return home safely  PT Goal Formulation: With patient Time For Goal Achievement: 06/19/16 Potential to Achieve Goals: Good Progress towards PT goals: Progressing toward goals    Frequency    Min 2X/week      PT Plan Current plan remains appropriate    Co-evaluation             End of Session Equipment Utilized During Treatment: Gait belt;Oxygen Activity Tolerance: Patient tolerated treatment well Patient left: in bed;with bed alarm set;with  nursing/sitter in room     Time: 1050-1116 PT Time Calculation (min) (ACUTE ONLY): 26 min  Charges:  $Gait Training: 23-37 mins                    G Codes:      Eldo Umanzor 06/07/2016, 5:17 PM  Elizabeth PalauStephanie Kalley Nicholl, PT, DPT [phone]

## 2016-06-08 ENCOUNTER — Emergency Department: Payer: BLUE CROSS/BLUE SHIELD

## 2016-06-08 ENCOUNTER — Inpatient Hospital Stay
Admission: EM | Admit: 2016-06-08 | Discharge: 2016-06-12 | DRG: 291 | Disposition: A | Discharge status: Home / Self Care | Payer: BLUE CROSS/BLUE SHIELD | Attending: Internal Medicine | Admitting: Internal Medicine

## 2016-06-08 ENCOUNTER — Encounter: Payer: Self-pay | Admitting: Emergency Medicine

## 2016-06-08 DIAGNOSIS — R740 Nonspecific elevation of levels of transaminase and lactic acid dehydrogenase [LDH]: Secondary | ICD-10-CM | POA: Diagnosis present

## 2016-06-08 DIAGNOSIS — I110 Hypertensive heart disease with heart failure: Principal | ICD-10-CM | POA: Diagnosis present

## 2016-06-08 DIAGNOSIS — Z7951 Long term (current) use of inhaled steroids: Secondary | ICD-10-CM | POA: Diagnosis not present

## 2016-06-08 DIAGNOSIS — J189 Pneumonia, unspecified organism: Secondary | ICD-10-CM

## 2016-06-08 DIAGNOSIS — Z79899 Other long term (current) drug therapy: Secondary | ICD-10-CM | POA: Diagnosis not present

## 2016-06-08 DIAGNOSIS — J181 Lobar pneumonia, unspecified organism: Secondary | ICD-10-CM

## 2016-06-08 DIAGNOSIS — I509 Heart failure, unspecified: Secondary | ICD-10-CM | POA: Diagnosis not present

## 2016-06-08 DIAGNOSIS — Z87891 Personal history of nicotine dependence: Secondary | ICD-10-CM

## 2016-06-08 DIAGNOSIS — R000 Tachycardia, unspecified: Secondary | ICD-10-CM

## 2016-06-08 DIAGNOSIS — J9621 Acute and chronic respiratory failure with hypoxia: Secondary | ICD-10-CM | POA: Diagnosis present

## 2016-06-08 DIAGNOSIS — E875 Hyperkalemia: Secondary | ICD-10-CM

## 2016-06-08 DIAGNOSIS — J9622 Acute and chronic respiratory failure with hypercapnia: Secondary | ICD-10-CM | POA: Diagnosis present

## 2016-06-08 DIAGNOSIS — R0902 Hypoxemia: Secondary | ICD-10-CM

## 2016-06-08 DIAGNOSIS — G9341 Metabolic encephalopathy: Secondary | ICD-10-CM | POA: Diagnosis present

## 2016-06-08 DIAGNOSIS — I5033 Acute on chronic diastolic (congestive) heart failure: Secondary | ICD-10-CM | POA: Diagnosis present

## 2016-06-08 DIAGNOSIS — J441 Chronic obstructive pulmonary disease with (acute) exacerbation: Secondary | ICD-10-CM | POA: Diagnosis present

## 2016-06-08 DIAGNOSIS — R4182 Altered mental status, unspecified: Secondary | ICD-10-CM | POA: Diagnosis present

## 2016-06-08 DIAGNOSIS — J9601 Acute respiratory failure with hypoxia: Secondary | ICD-10-CM | POA: Diagnosis present

## 2016-06-08 DIAGNOSIS — I472 Ventricular tachycardia: Secondary | ICD-10-CM

## 2016-06-08 LAB — BLOOD GAS, VENOUS
ACID-BASE EXCESS: 3.8 mmol/L — AB (ref 0.0–2.0)
BICARBONATE: 36.9 mmol/L — AB (ref 20.0–28.0)
FIO2: 0.36
O2 Saturation: 40.5 %
PATIENT TEMPERATURE: 37
PO2 VEN: 31 mmHg — AB (ref 32.0–45.0)
pCO2, Ven: 106 mmHg (ref 44.0–60.0)
pH, Ven: 7.15 — CL (ref 7.250–7.430)

## 2016-06-08 LAB — CBC
HCT: 43.1 % (ref 40.0–52.0)
HEMATOCRIT: 42.7 % (ref 40.0–52.0)
HEMOGLOBIN: 14 g/dL (ref 13.0–18.0)
Hemoglobin: 14.1 g/dL (ref 13.0–18.0)
MCH: 34.9 pg — AB (ref 26.0–34.0)
MCH: 35.2 pg — ABNORMAL HIGH (ref 26.0–34.0)
MCHC: 32.6 g/dL (ref 32.0–36.0)
MCHC: 32.8 g/dL (ref 32.0–36.0)
MCV: 107 fL — ABNORMAL HIGH (ref 80.0–100.0)
MCV: 107.2 fL — AB (ref 80.0–100.0)
Platelets: 242 10*3/uL (ref 150–440)
Platelets: 209 10*3/uL (ref 150–440)
RBC: 4.03 MIL/uL — ABNORMAL LOW (ref 4.40–5.90)
RBC: 3.98 MIL/uL — AB (ref 4.40–5.90)
RDW: 16.1 % — AB (ref 11.5–14.5)
RDW: 16.1 % — AB (ref 11.5–14.5)
WBC: 5.8 10*3/uL (ref 3.8–10.6)
WBC: 5.5 10*3/uL (ref 3.8–10.6)

## 2016-06-08 LAB — COMPREHENSIVE METABOLIC PANEL
ALK PHOS: 133 U/L — AB (ref 38–126)
ALT: 113 U/L — AB (ref 17–63)
AST: 33 U/L (ref 15–41)
Albumin: 3 g/dL — ABNORMAL LOW (ref 3.5–5.0)
Anion gap: 3 — ABNORMAL LOW (ref 5–15)
BILIRUBIN TOTAL: 1.1 mg/dL (ref 0.3–1.2)
BUN: 50 mg/dL — AB (ref 6–20)
CALCIUM: 8.9 mg/dL (ref 8.9–10.3)
CO2: 34 mmol/L — AB (ref 22–32)
CREATININE: 1.36 mg/dL — AB (ref 0.61–1.24)
Chloride: 99 mmol/L — ABNORMAL LOW (ref 101–111)
GFR calc Af Amer: >60 mL/min (ref >60)
GFR calc non Af Amer: 56 mL/min — ABNORMAL LOW (ref >60)
GLUCOSE: 95 mg/dL (ref 65–99)
Potassium: 4.4 mmol/L (ref 3.5–5.1)
Sodium: 136 mmol/L (ref 135–145)
TOTAL PROTEIN: 7.6 g/dL (ref 6.5–8.1)

## 2016-06-08 LAB — URINALYSIS, COMPLETE (UACMP) WITH MICROSCOPIC
Bacteria, UA: NONE SEEN
Bilirubin Urine: NEGATIVE
GLUCOSE, UA: NEGATIVE mg/dL
KETONES UR: NEGATIVE mg/dL
Leukocytes, UA: NEGATIVE
Nitrite: NEGATIVE
PH: 6 (ref 5.0–8.0)
Protein, ur: NEGATIVE mg/dL
Specific Gravity, Urine: 1.012 (ref 1.005–1.030)

## 2016-06-08 LAB — CREATININE, SERUM
Creatinine, Ser: 1.31 mg/dL — ABNORMAL HIGH (ref 0.61–1.24)
GFR, EST NON AFRICAN AMERICAN: 58 mL/min — AB (ref >60)

## 2016-06-08 LAB — GLUCOSE, CAPILLARY: GLUCOSE-CAPILLARY: 126 mg/dL — AB (ref 65–99)

## 2016-06-08 LAB — AMMONIA: Ammonia: 18 umol/L (ref 9–35)

## 2016-06-08 LAB — TROPONIN I: TROPONIN I: 0.04 ng/mL — AB (ref <0.03)

## 2016-06-08 MED ORDER — BISACODYL 5 MG PO TBEC: 5.0000 mg | DELAYED_RELEASE_TABLET | Freq: Every day | ORAL | Status: DC | PRN

## 2016-06-08 MED ORDER — ACETAMINOPHEN 325 MG PO TABS: 650.0000 mg | ORAL_TABLET | Freq: Four times a day (QID) | ORAL | Status: DC | PRN

## 2016-06-08 MED ORDER — CEFTRIAXONE SODIUM-DEXTROSE 1-3.74 GM-% IV SOLR
1.0000 g | Freq: Once | INTRAVENOUS | Status: AC
  Filled 2016-06-08: qty 50

## 2016-06-08 MED ORDER — CEFTRIAXONE SODIUM-DEXTROSE 1-3.74 GM-% IV SOLR
INTRAVENOUS | Status: AC
  Filled 2016-06-08: qty 50

## 2016-06-08 MED ORDER — ONDANSETRON HCL 4 MG/2ML IJ SOLN: 4.0000 mg | Freq: Four times a day (QID) | INTRAMUSCULAR | Status: DC | PRN

## 2016-06-08 MED ORDER — ACETAMINOPHEN 650 MG RE SUPP: 650.0000 mg | Freq: Four times a day (QID) | RECTAL | Status: DC | PRN

## 2016-06-08 MED ORDER — CEFTRIAXONE SODIUM-DEXTROSE 1-3.74 GM-% IV SOLR
1.0000 g | INTRAVENOUS | Status: DC
  Administered 2016-06-09 – 2016-06-10 (×3): 1 g via INTRAVENOUS
  Filled 2016-06-08 (×4): qty 50

## 2016-06-08 MED ORDER — DOCUSATE SODIUM 100 MG PO CAPS
100.0000 mg | ORAL_CAPSULE | Freq: Two times a day (BID) | ORAL | Status: DC
  Administered 2016-06-09 – 2016-06-12 (×7): 100 mg via ORAL
  Filled 2016-06-08 (×7): qty 1

## 2016-06-08 MED ORDER — METHYLPREDNISOLONE SODIUM SUCC 125 MG IJ SOLR
125.0000 mg | Freq: Once | INTRAMUSCULAR | Status: DC
  Filled 2016-06-08: qty 2

## 2016-06-08 MED ORDER — HYDROCODONE-ACETAMINOPHEN 5-325 MG PO TABS: 1.0000 | ORAL_TABLET | ORAL | Status: DC | PRN

## 2016-06-08 MED ORDER — TIOTROPIUM BROMIDE MONOHYDRATE 18 MCG IN CAPS
1.0000 | ORAL_CAPSULE | Freq: Every day | RESPIRATORY_TRACT | Status: DC
  Administered 2016-06-09 – 2016-06-12 (×4): 18 ug via RESPIRATORY_TRACT
  Filled 2016-06-08: qty 30
  Filled 2016-06-08: qty 5

## 2016-06-08 MED ORDER — METHYLPREDNISOLONE SODIUM SUCC 125 MG IJ SOLR
60.0000 mg | INTRAMUSCULAR | Status: DC
  Administered 2016-06-08 – 2016-06-10 (×3): 60 mg via INTRAVENOUS
  Filled 2016-06-08 (×2): qty 2

## 2016-06-08 MED ORDER — DEXTROSE 5 % IV SOLN
INTRAVENOUS | Status: AC
  Administered 2016-06-08: 500 mg via INTRAVENOUS
  Filled 2016-06-08: qty 500

## 2016-06-08 MED ORDER — DEXTROSE 5 % IV SOLN: 1.0000 g | INTRAVENOUS | Status: DC

## 2016-06-08 MED ORDER — IPRATROPIUM-ALBUTEROL 0.5-2.5 (3) MG/3ML IN SOLN
3.0000 mL | Freq: Once | RESPIRATORY_TRACT | Status: DC
  Filled 2016-06-08: qty 3

## 2016-06-08 MED ORDER — ONDANSETRON HCL 4 MG PO TABS: 4.0000 mg | ORAL_TABLET | Freq: Four times a day (QID) | ORAL | Status: DC | PRN

## 2016-06-08 MED ORDER — ENOXAPARIN SODIUM 40 MG/0.4ML ~~LOC~~ SOLN
40.0000 mg | Freq: Every day | SUBCUTANEOUS | Status: DC
  Administered 2016-06-09 – 2016-06-11 (×4): 40 mg via SUBCUTANEOUS
  Filled 2016-06-08 (×4): qty 0.4

## 2016-06-08 MED ORDER — DEXTROSE 5 % IV SOLN
500.0000 mg | INTRAVENOUS | Status: DC
  Administered 2016-06-08 – 2016-06-09 (×2): 500 mg via INTRAVENOUS
  Filled 2016-06-08 (×4): qty 500

## 2016-06-08 NOTE — H&P (Signed)
[facility] - [place] at [facility]   PATIENT NAME: Miguel Howell    MR#:  [id number]  DATE OF BIRTH:  11/21/1955  DATE OF ADMISSION:  06/08/2016  PRIMARY CARE PHYSICIAN: SPARKS,JEFFREY D, MD   REQUESTING/REFERRING PHYSICIAN: Dr. Cyril Loosen  CHIEF COMPLAINT: Altered mental status    Chief Complaint  Patient presents with  . Altered Mental Status    HISTORY OF PRESENT ILLNESS:  Miguel Howell  is a 61 y.o. male with a known history of Essential hypertension, COPD discharge from hospital yesterday on 4 L of oxygen went home and the patient did not know how to use oxygen, found unresponsive by the neighbor this morning. Patient was the discharge yesterday, and  friend dropped him athome. He lives by himself, unable to understand how to use oxygen, neighbor knocked on  his door he was not able to open, Neibert found him unresponsive. Brought in by EMS because of this. Found to have severe hypercapnia with CO2 more than 100.Started on BiPAP. Admi to stepdown unit/  PAST MEDICAL HISTORY:   Past Medical History:  Diagnosis Date  . Asthma   . COPD (chronic obstructive pulmonary disease) (HCC)   . Hypertension     PAST SURGICAL HISTOIRY:   Past Surgical History:  Procedure Laterality Date  . HERNIA REPAIR      SOCIAL HISTORY:   Social History  Substance Use Topics  . Smoking status: Former Smoker    Packs/day: 1.50    Years: 40.00    Types: Cigarettes    Quit date: 07/27/2015  . Smokeless tobacco: Former Neurosurgeon  . Alcohol use 1.2 oz/week    2 Glasses of wine per week     Comment: 3-4 mixed drinks every other day (Vodka, bourbon)    FAMILY HISTORY:   Family History  Problem Relation Age of Onset  . Diabetes Mother   . Diabetes Father   . COPD Father   . Heart attack Father     DRUG ALLERGIES:  No Known Allergies  REVIEW OF SYSTEMS:  CONSTITUTIONAL: No fever, fatigue or weakness.  EYES: No blurred or double vision.  EARS, NOSE, AND THROAT: No  tinnitus or ear pain.  RESPIRATORY: shortness of breath.  CARDIOVASCULAR: No chest pain, orthopnea, edema.  GASTROINTESTINAL: No nausea, vomiting, diarrhea or abdominal pain.  GENITOURINARY: No dysuria, hematuria.  ENDOCRINE: No polyuria, nocturia,  HEMATOLOGY: No anemia, easy bruising or bleeding SKIN: No rash or lesion. MUSCULOSKELETAL: No joint pain or arthritis.   NEUROLOGIC: No tingling, numbness, weakness.  PSYCHIATRY: No anxiety or depression.   MEDICATIONS AT HOME:   Prior to Admission medications   Medication Sig Start Date End Date Taking? Authorizing Provider  amoxicillin-clavulanate (AUGMENTIN) 875-125 MG tablet Take 1 tablet by mouth 2 (two) times daily. 06/07/16  Yes Ramonita Lab, MD  carvedilol (COREG) 6.25 MG tablet Take 6.25 mg by mouth 2 (two) times daily. 02/17/16  Yes Historical Provider, MD  guaiFENesin-dextromethorphan (ROBITUSSIN DM) 100-10 MG/5ML syrup Take 10 mLs by mouth every 6 (six) hours as needed for cough. 06/07/16  Yes Ramonita Lab, MD  LORazepam (ATIVAN) 1 MG tablet Take 1 mg by mouth at bedtime as needed for sleep. 02/17/16  Yes Historical Provider, MD  menthol-cetylpyridinium (CEPACOL) 3 MG lozenge Take 1 lozenge (3 mg total) by mouth as needed for sore throat. 06/07/16  Yes Ramonita Lab, MD  predniSONE (STERAPRED UNI-PAK 21 TAB) 10 MG (21) TBPK tablet Take 1 tablet (10 mg total) by mouth daily. Take 6  tablets by mouth for 1 day followed by  5 tablets by mouth for 1 day followed by  4 tablets by mouth for 1 day followed by  3 tablets by mouth for 1 day followed by  2 tablets by mouth for 1 day followed by  1 tablet by mouth for a day and stop 06/07/16  Yes Ramonita Lab, MD  PROAIR HFA 108 (90 Base) MCG/ACT inhaler Inhale 2 puffs into the lungs every 4 (four) hours as needed for shortness of breath. 03/05/16  Yes Historical Provider, MD  ranitidine (ZANTAC) 150 MG tablet Take 150 mg by mouth daily.   Yes Historical Provider, MD  senna (SENOKOT) 8.6 MG TABS tablet Take  1 tablet (8.6 mg total) by mouth daily as needed for mild constipation. 06/07/16  Yes Ramonita Lab, MD  SPIRIVA HANDIHALER 18 MCG inhalation capsule Place 1 capsule into inhaler and inhale daily. 03/09/16  Yes Historical Provider, MD  SYMBICORT 160-4.5 MCG/ACT inhaler Inhale 2 puffs into the lungs 2 (two) times daily. 02/07/16  Yes Historical Provider, MD  potassium chloride SA (K-DUR,KLOR-CON) 20 MEQ tablet Take 2 tablets (40 mEq total) by mouth 2 (two) times daily. Patient not taking: Reported on 06/04/2016 03/26/16 03/29/16  Auburn Bilberry, MD      VITAL SIGNS:  Blood pressure (!) 145/72, pulse (!) 58, temperature 97.6 F (36.4 C), temperature source Oral, resp. rate 16, height 5\' 9"  (1.753 m), weight 99.8 kg (220 lb), SpO2 97 %.  PHYSICAL EXAMINATION:  GENERAL:  61 y.o.-year-old patient lying in the bed with no acute distress. Has bipap on. EYES: Pupils equal, round, reactive to light and accommodation. No scleral icterus. Extraocular muscles intact.  HEENT: Head atraumatic, normocephalic. Oropharynx and nasopharynx clear.  NECK:  Supple, no jugular venous distention. No thyroid enlargement, no tenderness.  Lungs;expiratory wheeze in all lung fields., no  rales,rhonchi or crepitation. No use of accessory muscles of respiration.  CARDIOVASCULAR: S1, S2 normal. No murmurs, rubs, or gallops.  ABDOMEN: Soft, nontender, nondistended. Bowel sounds present. No organomegaly or mass.  EXTREMITIES: No pedal edema, cyanosis, or clubbing.  NEUROLOGIC: Cranial nerves II through XII are intact. Muscle strength 5/5 in all extremities. Sensation intact. Gait not checked.  PSYCHIATRIC: The patient is alert and oriented x 3.  SKIN: No obvious rash, lesion, or ulcer.   LABORATORY PANEL:   CBC  Recent Labs Lab 06/08/16 1433  WBC 5.8  HGB 14.1  HCT 43.1  PLT 242   ------------------------------------------------------------------------------------------------------------------  Chemistries   Recent  Labs Lab 06/08/16 1433  NA 136  K 4.4  CL 99*  CO2 34*  GLUCOSE 95  BUN 50*  CREATININE 1.36*  CALCIUM 8.9  AST 33  ALT 113*  ALKPHOS 133*  BILITOT 1.1   ------------------------------------------------------------------------------------------------------------------  Cardiac Enzymes  Recent Labs Lab 06/08/16 1433  TROPONINI 0.04*   ------------------------------------------------------------------------------------------------------------------  RADIOLOGY:  Dg Chest Portable 1 View  Result Date: 06/08/2016 CLINICAL DATA:  Pneumonia. EXAM: PORTABLE CHEST 1 VIEW COMPARISON:  06/04/2016 . FINDINGS: Cardiomegaly with bilateral mild interstitial prominence and bilateral pleural effusions. Findings consistent with congestive heart failure. No pneumothorax . IMPRESSION: Cardiomegaly with mild bilateral from interstitial edema and small bilateral pleural effusions. Electronically Signed   By: Maisie Fus  Register   On: 06/08/2016 15:43    EKG:   Orders placed or performed during the hospital encounter of 06/08/16  . ED EKG  . ED EKG  . EKG 12-Lead  . EKG 12-Lead    IMPRESSION AND PLAN:  Acute on chronic respiratory failure secondary to COPD exacerbation, hypercapnic respiratory failure not using oxygen at home. Continue BiPAP support tonight, admitted to intensive care unit stepdown unit, continue with steroids, patient recently discharged to home yesterday with the tapering course of prednisone, antibiotics because he is on BiPAP I am changing the antibiotics to IV with Rocephin, Zithromax, IV steroids. And repeat the blood gas tomorrow. Patient's  sisters wanted to take the patient to take patient with them as he lives alone he is discharged this time #2 acute kidney injury: Kidney function improved. Chest x-ray showed mild CHF hold off on the Lasix and monitor kidney function.  3. transaminitis: Improved.Marland Kitchen. 4.Chronic respiratory failure discharge home  Yesterday with 4 L of  oxygen, D on nebs, Pulmicort.. 5 .lethargy with metabolic encephalopathy secondary to co2 retension; D/w family   All the records are reviewed and case discussed with ED provider. Management plans discussed with the patient, family and they are in agreement.  CODE STATUS: full  TOTAL TIME TAKING CARE OF THIS PATIENT:55 minutes.    Katha HammingKONIDENA,Ronnica Dreese M.D on 06/08/2016 at 5:24 PM  Between 7am to 6pm - Pager - [phone]  After 6pm go to [email or web address] - password [email or web address] [facility]  [facility]  Office  [phone]  CC: Primary care physician; Marguarite ArbourSPARKS,JEFFREY D, MD  Note: This dictation was prepared with Dragon dictation along with smaller phrase technology. Any transcriptional errors that result from this process are unintentional.

## 2016-06-08 NOTE — ED Notes (Signed)
Spoke with dr Mayford Knifewilliams about pt. Orders placed

## 2016-06-08 NOTE — ED Provider Notes (Signed)
[facility] Emergency Department Provider Note   ____________________________________________    I have reviewed the triage vital signs and the nursing notes.   HISTORY  Chief Complaint Altered Mental Status     HPI Miguel Howell is a 61 y.o. male who presents with disorientation and confusion. He also feels short of breath. Patient was just discharged from the hospital yesterday for COPD exacerbation and possible healthcare associated pneumonia. He was discharged on oxygen and reports he has been using it. However he reports he was feeling confused and disoriented and decided to come back to the hospital.   Past Medical History:  Diagnosis Date  . Asthma   . COPD (chronic obstructive pulmonary disease) (HCC)   . Hypertension     Patient Active Problem List   Diagnosis Date Noted  . COPD exacerbation (HCC) 06/04/2016  . Diarrhea 03/25/2016  . Dehydration 03/25/2016  . Hypokalemia 03/25/2016  . HTN (hypertension) 03/25/2016  . COPD (chronic obstructive pulmonary disease) (HCC) 03/25/2016    Past Surgical History:  Procedure Laterality Date  . HERNIA REPAIR      Prior to Admission medications   Medication Sig Start Date End Date Taking? Authorizing Provider  amoxicillin-clavulanate (AUGMENTIN) 875-125 MG tablet Take 1 tablet by mouth 2 (two) times daily. 06/07/16   Ramonita Lab, MD  carvedilol (COREG) 6.25 MG tablet Take 6.25 mg by mouth 2 (two) times daily. 02/17/16   Historical Provider, MD  guaiFENesin-dextromethorphan (ROBITUSSIN DM) 100-10 MG/5ML syrup Take 10 mLs by mouth every 6 (six) hours as needed for cough. 06/07/16   Ramonita Lab, MD  LORazepam (ATIVAN) 1 MG tablet Take 1 mg by mouth at bedtime as needed for sleep. 02/17/16   Historical Provider, MD  menthol-cetylpyridinium (CEPACOL) 3 MG lozenge Take 1 lozenge (3 mg total) by mouth as needed for sore throat. 06/07/16   Ramonita Lab, MD  potassium chloride SA (K-DUR,KLOR-CON) 20 MEQ  tablet Take 2 tablets (40 mEq total) by mouth 2 (two) times daily. Patient not taking: Reported on 06/04/2016 03/26/16 03/29/16  Auburn Bilberry, MD  predniSONE (STERAPRED UNI-PAK 21 TAB) 10 MG (21) TBPK tablet Take 1 tablet (10 mg total) by mouth daily. Take 6 tablets by mouth for 1 day followed by  5 tablets by mouth for 1 day followed by  4 tablets by mouth for 1 day followed by  3 tablets by mouth for 1 day followed by  2 tablets by mouth for 1 day followed by  1 tablet by mouth for a day and stop 06/07/16   Ramonita Lab, MD  PROAIR HFA 108 (90 Base) MCG/ACT inhaler Inhale 2 puffs into the lungs every 4 (four) hours as needed for shortness of breath. 03/05/16   Historical Provider, MD  ranitidine (ZANTAC) 150 MG tablet Take 150 mg by mouth daily.    Historical Provider, MD  senna (SENOKOT) 8.6 MG TABS tablet Take 1 tablet (8.6 mg total) by mouth daily as needed for mild constipation. 06/07/16   Ramonita Lab, MD  SPIRIVA HANDIHALER 18 MCG inhalation capsule Place 1 capsule into inhaler and inhale daily. 03/09/16   Historical Provider, MD  SYMBICORT 160-4.5 MCG/ACT inhaler Inhale 2 puffs into the lungs 2 (two) times daily. 02/07/16   Historical Provider, MD     Allergies Patient has no known allergies.  Family History  Problem Relation Age of Onset  . Diabetes Mother   . Diabetes Father   . COPD Father   . Heart attack Father  Social History Social History  Substance Use Topics  . Smoking status: Former Smoker    Packs/day: 1.50    Years: 40.00    Types: Cigarettes    Quit date: 07/27/2015  . Smokeless tobacco: Former NeurosurgeonUser  . Alcohol use 1.2 oz/week    2 Glasses of wine per week     Comment: 3-4 mixed drinks every other day (Vodka, bourbon)    Review of Systems  Constitutional: No fevers reported   Cardiovascular: Denies chest pain. Respiratory: Does report shortness of breath Gastrointestinal: No abdominal pain.   Musculoskeletal: Negative for back pain. Skin: Negative for  rash. Neurological: Negative for headaches or focal weakness  10-point ROS otherwise negative.  ____________________________________________   PHYSICAL EXAM:  VITAL SIGNS: ED Triage Vitals  Enc Vitals Group     BP 06/08/16 1428 (!) 143/62     Pulse Rate 06/08/16 1426 64     Resp 06/08/16 1426 18     Temp 06/08/16 1426 97.6 F (36.4 C)     Temp Source 06/08/16 1426 Oral     SpO2 06/08/16 1426 98 %     Weight 06/08/16 1428 220 lb (99.8 kg)     Height 06/08/16 1428 5\' 9"  (1.753 m)     Head Circumference --      Peak Flow --      Pain Score --      Pain Loc --      Pain Edu? --      Excl. in GC? --     Constitutional: Alert But slow to answer Eyes: Conjunctivae are normal.  Head: Atraumatic. Nose: No congestion/rhinnorhea. Mouth/Throat: Mucous membranes are moist.    Cardiovascular: Normal rate, regular rhythm. Grossly normal heart sounds.  Good peripheral circulation. Respiratory: Increased respiratory effort, poor airflow bilaterally, positive wheezing, bibasilar rales Gastrointestinal: Soft and nontender. No distention.  No CVA tenderness. Genitourinary: deferred Musculoskeletal: 2+ edema bilaterally Warm and well perfused Neurologic:  No gross focal neurologic deficits are appreciated.  Skin:  Skin is warm, dry and intact. No rash noted.   ____________________________________________   LABS (all labs ordered are listed, but only abnormal results are displayed)  Labs Reviewed  COMPREHENSIVE METABOLIC PANEL - Abnormal; Notable for the following:       Result Value   Chloride 99 (*)    CO2 34 (*)    BUN 50 (*)    Creatinine, Ser 1.36 (*)    Albumin 3.0 (*)    ALT 113 (*)    Alkaline Phosphatase 133 (*)    GFR calc non Af Amer 56 (*)    Anion gap 3 (*)    All other components within normal limits  CBC - Abnormal; Notable for the following:    RBC 4.03 (*)    MCV 107.0 (*)    MCH 34.9 (*)    RDW 16.1 (*)    All other components within normal limits    BLOOD GAS, VENOUS - Abnormal; Notable for the following:    pH, Ven 7.15 (*)    pCO2, Ven 106 (*)    pO2, Ven 31.0 (*)    Bicarbonate 36.9 (*)    Acid-Base Excess 3.8 (*)    All other components within normal limits  URINALYSIS, COMPLETE (UACMP) WITH MICROSCOPIC - Abnormal; Notable for the following:    Color, Urine YELLOW (*)    APPearance CLEAR (*)    Hgb urine dipstick SMALL (*)    Squamous Epithelial / LPF 0-5 (*)  All other components within normal limits  TROPONIN I  AMMONIA   ____________________________________________  EKG  ED ECG REPORT I, Jene Every, the attending physician, personally viewed and interpreted this ECG.  Date: 06/08/2016  Rate: 65 Rhythm: normal sinus rhythm QRS Axis: normal Intervals: normal ST/T Wave abnormalities: Nonspecific changes Conduction Disturbances: none Narrative Interpretation: unremarkable  ____________________________________________  RADIOLOGY  Chest x-ray shows mild interstitial edema ____________________________________________   PROCEDURES  Procedure(s) performed: No    Critical Care performed: yes  CRITICAL CARE Performed by: Jene Every   Total critical care time: 30 minutes  Critical care time was exclusive of separately billable procedures and treating other patients.  Critical care was necessary to treat or prevent imminent or life-threatening deterioration.  Critical care was time spent personally by me on the following activities: development of treatment plan with patient and/or surrogate as well as nursing, discussions with consultants, evaluation of patient's response to treatment, examination of patient, obtaining history from patient or surrogate, ordering and performing treatments and interventions, ordering and review of laboratory studies, ordering and review of radiographic studies, pulse oximetry and re-evaluation of patient's  condition.  ____________________________________________   INITIAL IMPRESSION / ASSESSMENT AND PLAN / ED COURSE  Pertinent labs & imaging results that were available during my care of the patient were reviewed by me and considered in my medical decision making (see chart for details).  Patient presents with confusion and history of COPD. His VBG demonstrates a CO2 of greater than 100, this likely explains his clinical presentation. We have placed the patient on BiPAP. Labs, cxr pending. He will require admission.   Clinical Course    ____________________________________________   FINAL CLINICAL IMPRESSION(S) / ED DIAGNOSES  Final diagnoses:  Acute on chronic respiratory failure with hypercapnia (HCC)      NEW MEDICATIONS STARTED DURING THIS VISIT:  New Prescriptions   No medications on file     Note:  This document was prepared using Dragon voice recognition software and may include unintentional dictation errors.    Jene Every, MD 06/08/16 1710

## 2016-06-08 NOTE — ED Notes (Signed)
Hooked pt up to monitor.

## 2016-06-08 NOTE — ED Notes (Signed)
1 unsuccessful Iv attempt, would not float

## 2016-06-08 NOTE — ED Notes (Signed)
Dr Mayford Knifewilliams notified pg 7.15 co2 [id number]

## 2016-06-09 LAB — BASIC METABOLIC PANEL
Anion gap: 2 — ABNORMAL LOW (ref 5–15)
Anion gap: 5 (ref 5–15)
BUN: 43 mg/dL — ABNORMAL HIGH (ref 6–20)
BUN: 42 mg/dL — AB (ref 6–20)
CALCIUM: 8.9 mg/dL (ref 8.9–10.3)
CO2: 33 mmol/L — AB (ref 22–32)
CO2: 37 mmol/L — ABNORMAL HIGH (ref 22–32)
CREATININE: 1.17 mg/dL (ref 0.61–1.24)
Calcium: 9 mg/dL (ref 8.9–10.3)
Chloride: 102 mmol/L (ref 101–111)
Chloride: 96 mmol/L — ABNORMAL LOW (ref 101–111)
Creatinine, Ser: 1.11 mg/dL (ref 0.61–1.24)
GFR calc Af Amer: >60 mL/min (ref >60)
GFR calc Af Amer: >60 mL/min (ref >60)
GFR calc non Af Amer: >60 mL/min (ref >60)
GLUCOSE: 112 mg/dL — AB (ref 65–99)
GLUCOSE: 129 mg/dL — AB (ref 65–99)
POTASSIUM: 5.7 mmol/L — AB (ref 3.5–5.1)
POTASSIUM: 4.2 mmol/L (ref 3.5–5.1)
Sodium: 137 mmol/L (ref 135–145)
Sodium: 138 mmol/L (ref 135–145)

## 2016-06-09 LAB — BLOOD GAS, ARTERIAL
ACID-BASE EXCESS: 7.6 mmol/L — AB (ref 0.0–2.0)
Acid-Base Excess: 7.8 mmol/L — ABNORMAL HIGH (ref 0.0–2.0)
BICARBONATE: 37 mmol/L — AB (ref 20.0–28.0)
BICARBONATE: 36.6 mmol/L — AB (ref 20.0–28.0)
DELIVERY SYSTEMS: POSITIVE
EXPIRATORY PAP: 6
FIO2: 0.32
FIO2: 0.4
FIO2: 0.28
Inspiratory PAP: 12
O2 SAT: 96.8 %
O2 Saturation: 85.6 %
O2 Saturation: 89.3 %
PH ART: 7.29 — AB (ref 7.350–7.450)
Patient temperature: 37
Patient temperature: 37
Patient temperature: 37
RATE: 10 resp/min
pCO2 arterial: 77 mmHg (ref 32.0–48.0)
pCO2 arterial: 71 mmHg (ref 32.0–48.0)
pCO2 arterial: 79 mmHg (ref 32.0–48.0)
pH, Arterial: 7.32 — ABNORMAL LOW (ref 7.350–7.450)
pH, Arterial: 7.34 — ABNORMAL LOW (ref 7.350–7.450)
pO2, Arterial: 57 mmHg — ABNORMAL LOW (ref 83.0–108.0)
pO2, Arterial: 62 mmHg — ABNORMAL LOW (ref 83.0–108.0)

## 2016-06-09 LAB — CBC
HEMATOCRIT: 42 % (ref 40.0–52.0)
HEMOGLOBIN: 13.7 g/dL (ref 13.0–18.0)
MCH: 34.7 pg — AB (ref 26.0–34.0)
MCHC: 32.7 g/dL (ref 32.0–36.0)
MCV: 106.2 fL — ABNORMAL HIGH (ref 80.0–100.0)
Platelets: 235 10*3/uL (ref 150–440)
RBC: 3.96 MIL/uL — ABNORMAL LOW (ref 4.40–5.90)
RDW: 16.5 % — ABNORMAL HIGH (ref 11.5–14.5)
WBC: 4.1 10*3/uL (ref 3.8–10.6)

## 2016-06-09 LAB — CULTURE, BLOOD (ROUTINE X 2)
CULTURE: NO GROWTH
Culture: NO GROWTH

## 2016-06-09 LAB — MAGNESIUM: MAGNESIUM: 2.5 mg/dL — AB (ref 1.7–2.4)

## 2016-06-09 LAB — MRSA PCR SCREENING: MRSA by PCR: NEGATIVE

## 2016-06-09 LAB — BRAIN NATRIURETIC PEPTIDE: B NATRIURETIC PEPTIDE 5: 1871 pg/mL — AB (ref 0.0–100.0)

## 2016-06-09 LAB — GLUCOSE, CAPILLARY: GLUCOSE-CAPILLARY: 88 mg/dL (ref 65–99)

## 2016-06-09 MED ORDER — FUROSEMIDE 10 MG/ML IJ SOLN
40.0000 mg | Freq: Two times a day (BID) | INTRAMUSCULAR | Status: DC
  Administered 2016-06-09 – 2016-06-11 (×5): 40 mg via INTRAVENOUS
  Filled 2016-06-09 (×5): qty 4

## 2016-06-09 MED ORDER — ALBUTEROL SULFATE (2.5 MG/3ML) 0.083% IN NEBU: 2.5000 mg | INHALATION_SOLUTION | RESPIRATORY_TRACT | Status: DC | PRN

## 2016-06-09 MED ORDER — IPRATROPIUM-ALBUTEROL 0.5-2.5 (3) MG/3ML IN SOLN
3.0000 mL | Freq: Four times a day (QID) | RESPIRATORY_TRACT | Status: DC
  Administered 2016-06-09 – 2016-06-10 (×5): 3 mL via RESPIRATORY_TRACT
  Filled 2016-06-09 (×5): qty 3

## 2016-06-09 NOTE — Care Management (Signed)
Patient recently discharged to home followed by [facility]. Looks like he received charity O2 also through [facility]. I have left message for Barbara CowerJason with [facility] to check status of patient. (3 adm in last 6 MOS). RNCM will follow up with patient.

## 2016-06-09 NOTE — Care Management (Addendum)
Patient discharged recently (06/07/16) and [facility] never got the chance to open him for home health charity services. I understand taht he has home O2 also arranged with [facility]. RNCM will follow up with patient. This afternoon- patient is now showing to have [employer] health care coverage. I have updated Barbara CowerJason with [facility].

## 2016-06-09 NOTE — Progress Notes (Signed)
[facility] - [place] at [facility]   PATIENT NAME: Miguel Howell    MR#:  [id number]  DATE OF BIRTH:  02/15/1956  SUBJECTIVE:  CHIEF COMPLAINT:   Chief Complaint  Patient presents with  . Altered Mental Status   Admitted for respiratory failure with confusion. Found by a neighbor at home. Patient was supposed be on oxygen and not wearing it.  Today he is awake but has audible wheezing. Still looks ill. Pulled off his BiPAP.  REVIEW OF SYSTEMS:    Review of Systems  Unable to perform ROS: Mental status change   DRUG ALLERGIES:  No Known Allergies  VITALS:  Blood pressure 139/71, pulse 60, temperature 98 F (36.7 C), temperature source Axillary, resp. rate (!) 21, height (!) 5.8" (0.147 m), weight 105.6 kg (232 lb 12.9 oz), SpO2 97 %.  PHYSICAL EXAMINATION:   Physical Exam  GENERAL:  61 y.o.-year-old patient lying in the bed. Looks critically ill. Confused. Audible wheezing EYES: Pupils equal, round, reactive to light and accommodation. No scleral icterus. Extraocular muscles intact.  HEENT: Head atraumatic, normocephalic. Oropharynx and nasopharynx clear.  NECK:  Supple, no jugular venous distention. No thyroid enlargement, no tenderness.  LUNGS: Decreased air entry on both sides with bilateral wheezing CARDIOVASCULAR: S1, S2 normal. No murmurs, rubs, or gallops.  ABDOMEN: Soft, nontender, nondistended. Bowel sounds present. No organomegaly or mass.  EXTREMITIES: No cyanosis, clubbing or edema b/l.    NEUROLOGIC: Cranial nerves II through XII are intact. No focal Motor or sensory deficits b/l.   PSYCHIATRIC: The patient is alert and awake. Anxious. SKIN: No obvious rash, lesion, or ulcer.   LABORATORY PANEL:   CBC  Recent Labs Lab 06/09/16 0559  WBC 4.1  HGB 13.7  HCT 42.0  PLT 235   ------------------------------------------------------------------------------------------------------------------ Chemistries   Recent Labs Lab 06/08/16 1433   06/09/16 0559  NA 136  --  137  K 4.4  --  5.7*  CL 99*  --  102  CO2 34*  --  33*  GLUCOSE 95  --  112*  BUN 50*  --  43*  CREATININE 1.36*  < > 1.11  CALCIUM 8.9  --  9.0  AST 33  --   --   ALT 113*  --   --   ALKPHOS 133*  --   --   BILITOT 1.1  --   --   < > = values in this interval not displayed. ------------------------------------------------------------------------------------------------------------------  Cardiac Enzymes  Recent Labs Lab 06/08/16 1433  TROPONINI 0.04*   ------------------------------------------------------------------------------------------------------------------  RADIOLOGY:  Dg Chest Portable 1 View  Result Date: 06/08/2016 CLINICAL DATA:  Pneumonia. EXAM: PORTABLE CHEST 1 VIEW COMPARISON:  06/04/2016 . FINDINGS: Cardiomegaly with bilateral mild interstitial prominence and bilateral pleural effusions. Findings consistent with congestive heart failure. No pneumothorax . IMPRESSION: Cardiomegaly with mild bilateral from interstitial edema and small bilateral pleural effusions. Electronically Signed   By: Maisie Fus  Register   On: 06/08/2016 15:43     ASSESSMENT AND PLAN:   * Acute on chronic respiratory failure due to COPD exacerbation and acute on chronic CHF Continue BiPAP support. We'll repeat ABG now. Nebulizers when necessary. Wean off BiPAP as tolerated.   * Acute on chronic CHF Unknown systolic or diastolic Start IV Lasix. Elevated BNP. Check echocardiogram. Monitor input and output. Daily weight. Repeat labs later today.  * COPD exacerbation -IV steroids, Antibiotics - Scheduled Nebulizers - Inhalers -Wean O2 as tolerated - Consult pulmonary if no improvement  *  Hyperkalemia Should improve with diuresis.  * Acute encephalopathy due to CO2 narcosis Slowly improving. Continue BiPAP.  * DVT prophylaxis with Lovenox  All the records are reviewed and case discussed with Care Management/Social Workerr. Management plans  discussed with the patient, family and they are in agreement.  CODE STATUS: FULL CODE  DVT Prophylaxis: SCDs  TOTAL CC TIME TAKING CARE OF THIS PATIENT: 35 minutes.   POSSIBLE D/C IN 3-4 DAYS, DEPENDING ON CLINICAL CONDITION.  Milagros LollSudini, Kasyn Rolph R M.D on 06/09/2016 at 10:24 AM  Between 7am to 6pm - Pager - [phone]  After 6pm go to [email or web address] - password [email or web address] [facility]  [facility] [place] [facility]  Office  [phone]  CC: Primary care physician; Marguarite ArbourSPARKS,JEFFREY D, MD  Note: This dictation was prepared with Dragon dictation along with smaller phrase technology. Any transcriptional errors that result from this process are unintentional.

## 2016-06-09 NOTE — Care Management (Deleted)
[]  Hover for attribution information Patient recently discharged to home followed by [facility]. Looks like he received charity O2 also through [facility]. I have left message for Barbara CowerJason with [facility] to check status of patient. (3 adm in last 6 MOS). RNCM will follow up with patient.

## 2016-06-09 NOTE — Progress Notes (Addendum)
Received pt from ED, pt is very sleepy, unable to answer any detial question expect yes or no question. Pt is orientated to name and place but disorientated to time and situations. Pt on bipap now. , VSS, unable to obtain any medical history at this time,. No family with around.

## 2016-06-10 ENCOUNTER — Inpatient Hospital Stay (HOSPITAL_COMMUNITY)
Admit: 2016-06-10 | Discharge: 2016-06-10 | Disposition: A | Discharge status: Home / Self Care | Payer: BLUE CROSS/BLUE SHIELD | Attending: Internal Medicine | Admitting: Internal Medicine

## 2016-06-10 DIAGNOSIS — I509 Heart failure, unspecified: Secondary | ICD-10-CM

## 2016-06-10 LAB — ECHOCARDIOGRAM COMPLETE
HEIGHTINCHES: 5.8 in
Weight: 3626.13 oz

## 2016-06-10 LAB — BASIC METABOLIC PANEL
ANION GAP: 3 — AB (ref 5–15)
BUN: 34 mg/dL — ABNORMAL HIGH (ref 6–20)
CO2: 41 mmol/L — ABNORMAL HIGH (ref 22–32)
CREATININE: 0.96 mg/dL (ref 0.61–1.24)
Calcium: 8.6 mg/dL — ABNORMAL LOW (ref 8.9–10.3)
Chloride: 95 mmol/L — ABNORMAL LOW (ref 101–111)
GLUCOSE: 140 mg/dL — AB (ref 65–99)
Potassium: 4.4 mmol/L (ref 3.5–5.1)
Sodium: 139 mmol/L (ref 135–145)

## 2016-06-10 LAB — GLUCOSE, CAPILLARY
GLUCOSE-CAPILLARY: 158 mg/dL — AB (ref 65–99)
Glucose-Capillary: 185 mg/dL — ABNORMAL HIGH (ref 65–99)

## 2016-06-10 MED ORDER — ALBUTEROL SULFATE (2.5 MG/3ML) 0.083% IN NEBU
2.5000 mg | INHALATION_SOLUTION | RESPIRATORY_TRACT | Status: DC
  Administered 2016-06-10 – 2016-06-11 (×3): 2.5 mg via RESPIRATORY_TRACT
  Filled 2016-06-10 (×3): qty 3

## 2016-06-10 MED ORDER — BUDESONIDE 0.25 MG/2ML IN SUSP
0.2500 mg | Freq: Two times a day (BID) | RESPIRATORY_TRACT | Status: DC
  Administered 2016-06-10 – 2016-06-12 (×4): 0.25 mg via RESPIRATORY_TRACT
  Filled 2016-06-10 (×6): qty 2

## 2016-06-10 MED ORDER — FOLIC ACID 1 MG PO TABS
1.0000 mg | ORAL_TABLET | Freq: Every day | ORAL | Status: DC
  Administered 2016-06-10 – 2016-06-12 (×3): 1 mg via ORAL
  Filled 2016-06-10 (×3): qty 1

## 2016-06-10 NOTE — Progress Notes (Signed)
Report called to Tiffany, RN on 2A. Patient was placed on 2A telemetry box, moved to their bed, then transferred to room 254.

## 2016-06-10 NOTE — Progress Notes (Signed)
[employer] - [place]   PATIENT NAME: Miguel Howell    MR#:  [id number]  DATE OF BIRTH:  January 29, 1956  SUBJECTIVE:  CHIEF COMPLAINT:   Chief Complaint  Patient presents with  . Altered Mental Status   Admitted for respiratory failure with confusion. Found by a neighbor at home. Patient was supposed be on oxygen and not wearing it.  The patient was weaned off BiPAP now on oxygen through nasal cannulas, he feels good, admits of some cough, whitish to yellowish sputum production, shortness of breath, less wheezing.Marland Kitchen.  REVIEW OF SYSTEMS:    Review of Systems  Unable to perform ROS: Mental status change  Constitutional: Negative for chills, fever and weight loss.  HENT: Negative for congestion.   Eyes: Negative for blurred vision and double vision.  Respiratory: Positive for cough, sputum production, shortness of breath and wheezing.   Cardiovascular: Negative for chest pain, palpitations, orthopnea, leg swelling and PND.  Gastrointestinal: Negative for abdominal pain, blood in stool, constipation, diarrhea, nausea and vomiting.  Genitourinary: Negative for dysuria, frequency, hematuria and urgency.  Musculoskeletal: Negative for falls.  Neurological: Negative for dizziness, tremors, focal weakness and headaches.  Endo/Heme/Allergies: Does not bruise/bleed easily.  Psychiatric/Behavioral: Negative for depression. The patient does not have insomnia.    DRUG ALLERGIES:  No Known Allergies  VITALS:  Blood pressure 117/60, pulse 82, temperature 98.6 F (37 C), temperature source Oral, resp. rate (!) 22, height (!) 5.8" (0.147 m), weight 102.8 kg (226 lb 10.1 oz), SpO2 93 %.  PHYSICAL EXAMINATION:   Physical Exam  GENERAL:  61 y.o.-year-old patient lying in the bed. Comfortable, smiling EYES: Pupils equal, round, reactive to light and accommodation. No scleral icterus. Extraocular muscles intact.  HEENT: Head atraumatic, normocephalic. Oropharynx and  nasopharynx clear.  NECK:  Supple, no jugular venous distention. No thyroid enlargement, no tenderness.  LUNGS: Diminished breath sounds bilaterally, scattered wheezing, better air entrance anteriorly, very poor air entrance posteriorly  CARDIOVASCULAR: S1, S2 normal. No murmurs, rubs, or gallops.  ABDOMEN: Soft, nontender, nondistended. Bowel sounds present. No organomegaly or mass.  EXTREMITIES: No cyanosis, clubbing or edema b/l.    NEUROLOGIC: Cranial nerves II through XII are intact. No focal Motor or sensory deficits b/l.   PSYCHIATRIC: The patient is alert and awake. Marland Kitchen. SKIN: No obvious rash, lesion, or ulcer.   LABORATORY PANEL:   CBC  Recent Labs Lab 06/09/16 0559  WBC 4.1  HGB 13.7  HCT 42.0  PLT 235   ------------------------------------------------------------------------------------------------------------------ Chemistries   Recent Labs Lab 06/08/16 1433  06/09/16 1449 06/10/16 0402  NA 136  < > 138 139  K 4.4  < > 4.2 4.4  CL 99*  < > 96* 95*  CO2 34*  < > 37* 41*  GLUCOSE 95  < > 129* 140*  BUN 50*  < > 42* 34*  CREATININE 1.36*  < > 1.17 0.96  CALCIUM 8.9  < > 8.9 8.6*  MG  --   --  2.5*  --   AST 33  --   --   --   ALT 113*  --   --   --   ALKPHOS 133*  --   --   --   BILITOT 1.1  --   --   --   < > = values in this interval not displayed. ------------------------------------------------------------------------------------------------------------------  Cardiac Enzymes  Recent Labs Lab 06/08/16 1433  TROPONINI 0.04*   ------------------------------------------------------------------------------------------------------------------  RADIOLOGY:  No results found.  ASSESSMENT AND PLAN:   * Acute on chronic respiratory failure With hypoxia and hypercapnia due to COPD exacerbation and acute on chronic CHF Now, off BiPAP on oxygen via nasal cannulas with good saturations, following CO2 levels and BMP, rising. Nebulizers , budesonide, SVNs,  follow clinically.    * Acute on chronic diastolic CHF Continue IV Lasix.  echocardiogram revealed normal ejection fraction, LVH, diastolic dysfunction, severely elevated pulmonary arterial pressures. Monitor input and output. Daily weight. Repeating labs tomorrow  * COPD exacerbation -IV steroids, Antibiotics - Scheduled Nebulizers - Inhalers -Wean O2 as tolerated - Consult pulmonary if no improvement  * Hyperkalemia Improved with diuresis.  * Acute encephalopathy due to CO2 narcosis Improved, now off BiPAP.  * DVT prophylaxis with Lovenox  All the records are reviewed and case discussed with Care Management/Social Workerr. Management plans discussed with the patient, family and they are in agreement.  CODE STATUS: FULL CODE  DVT Prophylaxis: SCDs  TOTAL CC TIME TAKING CARE OF THIS PATIENT: 35 minutes.   POSSIBLE D/C IN 3-4 DAYS, DEPENDING ON CLINICAL CONDITION.  [name] M.D on 06/10/2016 at 4:16 PM  Between 7am to 6pm - Pager - [phone]  After 6pm go to [email or web address] - password [email or web address] [facility]  [facility]  Office  [phone]  CC: Primary care physician; Marguarite Arbour, MD  Note: This dictation was prepared with Dragon dictation along with smaller phrase technology. Any transcriptional errors that result from this process are unintentional.

## 2016-06-10 NOTE — Progress Notes (Signed)
Patient received in room 254, stable in no acute distress, telemetry monitor already in place. Patient settled and welcomed to unit as patient is transfer from ICU.

## 2016-06-11 ENCOUNTER — Inpatient Hospital Stay: Payer: BLUE CROSS/BLUE SHIELD

## 2016-06-11 ENCOUNTER — Encounter: Payer: Self-pay | Admitting: Radiology

## 2016-06-11 LAB — GLUCOSE, CAPILLARY
GLUCOSE-CAPILLARY: 143 mg/dL — AB (ref 65–99)
Glucose-Capillary: 101 mg/dL — ABNORMAL HIGH (ref 65–99)

## 2016-06-11 LAB — BASIC METABOLIC PANEL
ANION GAP: 6 (ref 5–15)
BUN: 25 mg/dL — ABNORMAL HIGH (ref 6–20)
CHLORIDE: 91 mmol/L — AB (ref 101–111)
CO2: 41 mmol/L — AB (ref 22–32)
Calcium: 8.5 mg/dL — ABNORMAL LOW (ref 8.9–10.3)
Creatinine, Ser: 1.09 mg/dL (ref 0.61–1.24)
GFR calc Af Amer: >60 mL/min (ref >60)
GFR calc non Af Amer: >60 mL/min (ref >60)
GLUCOSE: 141 mg/dL — AB (ref 65–99)
POTASSIUM: 4.2 mmol/L (ref 3.5–5.1)
Sodium: 138 mmol/L (ref 135–145)

## 2016-06-11 LAB — MAGNESIUM: MAGNESIUM: 1.8 mg/dL (ref 1.7–2.4)

## 2016-06-11 MED ORDER — ALBUTEROL SULFATE (2.5 MG/3ML) 0.083% IN NEBU
2.5000 mg | INHALATION_SOLUTION | Freq: Four times a day (QID) | RESPIRATORY_TRACT | Status: DC
  Administered 2016-06-11 – 2016-06-12 (×3): 2.5 mg via RESPIRATORY_TRACT
  Filled 2016-06-11 (×5): qty 3

## 2016-06-11 MED ORDER — FUROSEMIDE 40 MG PO TABS
40.0000 mg | ORAL_TABLET | Freq: Two times a day (BID) | ORAL | Status: DC
  Administered 2016-06-11 – 2016-06-12 (×2): 40 mg via ORAL
  Filled 2016-06-11 (×2): qty 1

## 2016-06-11 MED ORDER — SODIUM CHLORIDE 0.9% FLUSH
3.0000 mL | Freq: Two times a day (BID) | INTRAVENOUS | Status: DC
  Administered 2016-06-11 – 2016-06-12 (×2): 3 mL via INTRAVENOUS

## 2016-06-11 MED ORDER — PREDNISONE 50 MG PO TABS
50.0000 mg | ORAL_TABLET | Freq: Every day | ORAL | Status: DC
  Administered 2016-06-12: 50 mg via ORAL
  Filled 2016-06-11: qty 1

## 2016-06-11 MED ORDER — SODIUM CHLORIDE 0.9% FLUSH: 3.0000 mL | INTRAVENOUS | Status: DC | PRN

## 2016-06-11 MED ORDER — POLYETHYLENE GLYCOL 3350 17 G PO PACK
17.0000 g | PACK | Freq: Two times a day (BID) | ORAL | Status: DC
  Administered 2016-06-11 – 2016-06-12 (×3): 17 g via ORAL
  Filled 2016-06-11 (×3): qty 1

## 2016-06-11 MED ORDER — AZITHROMYCIN 250 MG PO TABS
250.0000 mg | ORAL_TABLET | Freq: Every day | ORAL | Status: DC
  Administered 2016-06-11: 250 mg via ORAL
  Filled 2016-06-11: qty 1

## 2016-06-11 MED ORDER — IOPAMIDOL (ISOVUE-370) INJECTION 76%
75.0000 mL | Freq: Once | INTRAVENOUS | Status: AC | PRN
  Administered 2016-06-11: 75 mL via INTRAVENOUS

## 2016-06-11 NOTE — Consult Note (Signed)
[facility]  CARDIOLOGY CONSULT NOTE  Patient ID: Miguel Howell MRN: [id number] DOB/AGE: Apr 06, 1956 61 y.o.  Admit date: 06/08/2016 Referring Physician Winona Legato Primary Physician [facility] Primary Cardiologist  Reason for Consultation Apparent wide-complex tachycardia  HPI: 61 year old gentleman referred for evaluation of apparent wide-complex tachycardia. The patient has known COPD, recently discharged on 4 L of oxygen, only to return if altered mental status and hypoxia with hypercapnia. The patient was admitted to telemetry, where he was noted the patient had wide complex tachycardia. The patient was asymptomatic during the episode. 2-D echocardiogram was performed which revealed normal left ventricular function with LVEF is 65-70%. The patient's had no recurrent episodes of wide-complex tachycardia. Review of the cardiovascular strip reveals probable artifact, with QRS complexes stepping through the apparent wide-complex tachycardia.  Review of systems complete and found to be negative unless listed above     Past Medical History:  Diagnosis Date  . Asthma   . COPD (chronic obstructive pulmonary disease) (HCC)   . Hypertension     Past Surgical History:  Procedure Laterality Date  . HERNIA REPAIR      Prescriptions Prior to Admission  Medication Sig Dispense Refill Last Dose  . amoxicillin-clavulanate (AUGMENTIN) 875-125 MG tablet Take 1 tablet by mouth 2 (two) times daily. 14 tablet 0 UNKNOWN at UNKNOWN  . carvedilol (COREG) 6.25 MG tablet Take 6.25 mg by mouth 2 (two) times daily.  11 UNKNOWN at UNKNOWN  . guaiFENesin-dextromethorphan (ROBITUSSIN DM) 100-10 MG/5ML syrup Take 10 mLs by mouth every 6 (six) hours as needed for cough. 118 mL 0 UNKNOWN at UNKNOWN  . LORazepam (ATIVAN) 1 MG tablet Take 1 mg by mouth at bedtime as needed for sleep.  5 UNKNOWN at UNKNOWN  . menthol-cetylpyridinium (CEPACOL) 3 MG lozenge Take 1 lozenge (3 mg total) by mouth as needed for sore throat. 100  tablet 12 UNKNOWN at UNKNOWN  . predniSONE (STERAPRED UNI-PAK 21 TAB) 10 MG (21) TBPK tablet Take 1 tablet (10 mg total) by mouth daily. Take 6 tablets by mouth for 1 day followed by  5 tablets by mouth for 1 day followed by  4 tablets by mouth for 1 day followed by  3 tablets by mouth for 1 day followed by  2 tablets by mouth for 1 day followed by  1 tablet by mouth for a day and stop 21 tablet 0 UNKNOWN at UNKNOWN  . PROAIR HFA 108 (90 Base) MCG/ACT inhaler Inhale 2 puffs into the lungs every 4 (four) hours as needed for shortness of breath.  2 UNKNOWN at UNKNOWN  . ranitidine (ZANTAC) 150 MG tablet Take 150 mg by mouth daily.   UNKNOWN at UNKNOWN  . senna (SENOKOT) 8.6 MG TABS tablet Take 1 tablet (8.6 mg total) by mouth daily as needed for mild constipation. 120 each 0 UNKNOWN at UNKNOWN  . SPIRIVA HANDIHALER 18 MCG inhalation capsule Place 1 capsule into inhaler and inhale daily.  11 UNKNOWN at UNKNOWN  . SYMBICORT 160-4.5 MCG/ACT inhaler Inhale 2 puffs into the lungs 2 (two) times daily.  5 UNKNOWN at UNKNOWN  . potassium chloride SA (K-DUR,KLOR-CON) 20 MEQ tablet Take 2 tablets (40 mEq total) by mouth 2 (two) times daily. (Patient not taking: Reported on 06/04/2016) 12 tablet 0 Not Taking at Unknown time   Social History   Social History  . Marital status: Single    Spouse name: N/A  . Number of children: N/A  . Years of education: N/A   Occupational History  .  Not on file.   Social History Main Topics  . Smoking status: Former Smoker    Packs/day: 1.50    Years: 40.00    Types: Cigarettes    Quit date: 07/27/2015  . Smokeless tobacco: Former NeurosurgeonUser  . Alcohol use 1.2 oz/week    2 Glasses of wine per week     Comment: 3-4 mixed drinks every other day (Vodka, bourbon)  . Drug use: No  . Sexual activity: Not on file   Other Topics Concern  . Not on file   Social History Narrative  . No narrative on file    Family History  Problem Relation Age of Onset  . Diabetes  Mother   . Diabetes Father   . COPD Father   . Heart attack Father       Review of systems complete and found to be negative unless listed above      PHYSICAL EXAM  General: Well developed, well nourished, in no acute distress HEENT:  Normocephalic and atramatic Neck:  No JVD.  Lungs: Clear bilaterally to auscultation and percussion. Heart: HRRR . Normal S1 and S2 without gallops or murmurs.  Abdomen: Bowel sounds are positive, abdomen soft and non-tender  Msk:  Back normal, normal gait. Normal strength and tone for age. Extremities: No clubbing, cyanosis or edema.   Neuro: Alert and oriented X 3. Psych:  Good affect, responds appropriately  Labs:   Lab Results  Component Value Date   WBC 4.1 06/09/2016   HGB 13.7 06/09/2016   HCT 42.0 06/09/2016   MCV 106.2 (H) 06/09/2016   PLT 235 06/09/2016    Recent Labs Lab 06/08/16 1433  06/11/16 0548  NA 136  < > 138  K 4.4  < > 4.2  CL 99*  < > 91*  CO2 34*  < > 41*  BUN 50*  < > 25*  CREATININE 1.36*  < > 1.09  CALCIUM 8.9  < > 8.5*  PROT 7.6  --   --   BILITOT 1.1  --   --   ALKPHOS 133*  --   --   ALT 113*  --   --   AST 33  --   --   GLUCOSE 95  < > 141*  < > = values in this interval not displayed. Lab Results  Component Value Date   TROPONINI 0.04 (HH) 06/08/2016   No results found for: CHOL No results found for: HDL No results found for: LDLCALC No results found for: TRIG No results found for: CHOLHDL No results found for: LDLDIRECT    Radiology: Ct Angio Chest Pe W Or Wo Contrast  Result Date: 06/11/2016 CLINICAL DATA:  Shortness of breath and hypoxia EXAM: CT ANGIOGRAPHY CHEST WITH CONTRAST TECHNIQUE: Multidetector CT imaging of the chest was performed using the standard protocol during bolus administration of intravenous contrast. Multiplanar CT image reconstructions and MIPs were obtained to evaluate the vascular anatomy. CONTRAST:  75 mL Isovue 370 COMPARISON:  Chest x-ray from earlier in the same  day. FINDINGS: Cardiovascular: Thoracic aorta is within normal limits without aneurysmal dilatation or dissection. The pulmonary artery shows a normal branching pattern. No filling defects to suggest pulmonary emboli are identified. No significant coronary calcifications are seen. Cardiac structures are mildly prominent. Mediastinum/Nodes: Thoracic inlet is within normal limits. No significant hilar or mediastinal adenopathy is noted. Lungs/Pleura: Bilateral large pleural effusions are noted right greater than left similar to that seen recent chest x-ray. Associated lower lobe consolidation  is also noted right greater left. Upper Abdomen: 15 mm central hypodensity is noted within the left adrenal gland likely representing a small adenoma. No other definitive abnormality of abdomen is seen. Musculoskeletal: Degenerative change of the thoracic spine is noted. Review of the MIP images confirms the above findings. IMPRESSION: No evidence of pulmonary emboli. Bilateral lower lobe consolidation with associated effusions right greater than left. Likely Left adrenal adenoma. Electronically Signed   By: Alcide Clever M.D.   On: 06/11/2016 12:22   Dg Chest Port 1 View  Result Date: 06/11/2016 CLINICAL DATA:  CHF EXAM: PORTABLE CHEST 1 VIEW COMPARISON:  06/08/2016 FINDINGS: Cardiac shadow is mildly enlarged. Bilateral pleural effusions right greater than left are noted. No significant vascular congestion is seen. No focal infiltrate is noted. No bony abnormality is seen. IMPRESSION: Bilateral pleural effusions relatively stable from the prior exam Electronically Signed   By: Alcide Clever M.D.   On: 06/11/2016 07:53   Dg Chest Portable 1 View  Result Date: 06/08/2016 CLINICAL DATA:  Pneumonia. EXAM: PORTABLE CHEST 1 VIEW COMPARISON:  06/04/2016 . FINDINGS: Cardiomegaly with bilateral mild interstitial prominence and bilateral pleural effusions. Findings consistent with congestive heart failure. No pneumothorax .  IMPRESSION: Cardiomegaly with mild bilateral from interstitial edema and small bilateral pleural effusions. Electronically Signed   By: Maisie Fus  Register   On: 06/08/2016 15:43   Dg Chest Portable 1 View  Result Date: 06/04/2016 CLINICAL DATA:  Patient arrives from home via POV with complaint of generalized weakness x 3-4 days, slurred speech. During triage assessment, patient noted to be pale appearing with hypoxia, pale mucous membranes, and hypotensive. Hx/o COPD and HTN. Former smoker. EXAM: PORTABLE CHEST 1 VIEW COMPARISON:  03/24/2016 FINDINGS: Heart size is normal. No focal consolidations. There are slightly prominent markings at the bases, right greater than left raising the question of shallow inflation versus early infiltrate. Consider follow-up as needed to evaluate for infiltrate. IMPRESSION: Question early infiltrate at the bases, right greater than left. Follow-up is recommended. Electronically Signed   By: Norva Pavlov M.D.   On: 06/04/2016 11:08   US Abdomen Limited Ruq  Result Date: 06/04/2016 CLINICAL DATA:  Abnormal liver enzymes.  Abdominal pain. EXAM: US ABDOMEN LIMITED - RIGHT UPPER QUADRANT COMPARISON:  10/02/2008 FINDINGS: Gallbladder: Gallbladder wall is thickened with hypoechoic edema measuring 4.8 mm. Gallbladder sludge is present. No pericholecystic fluid. No sonographic Murphy's sign. Common bile duct: Diameter: 2.5 mm Liver: No focal liver lesions are identified. Mildly increased echotexture of the liver. On Doppler evaluation of the portal vein, there is color Doppler evidence for bidirectional flow. IMPRESSION: 1. Gallbladder wall thickening and gallbladder sludge, nonspecific findings. No other evidence for acute cholecystitis. 2. Mildly echogenic liver. 3. Possible bidirectional flow within the portal vein. Consider dedicated Doppler evaluation of the portal vein to evaluate for portal venous hypertension. Electronically Signed   By: Norva Pavlov M.D.   On:  06/04/2016 12:21    EKG: Normal sinus rhythm  ASSESSMENT AND PLAN:   1. Reported wide-complex tachycardia, probable artifact, setting of hemodynamic stability, with normal left ventricular function  Recommendations  1. Continue current therapy 2. Continue telemetry for now 3. Defer antiarrhythmic therapy 4. Defer further cardiac diagnostics at this time  Signed off for now, please call if any questions  Signed: Marcina Millard MD,PhD, [facility] 06/11/2016, 12:59 PM

## 2016-06-11 NOTE — Progress Notes (Signed)
CCMD reports 15 beats of VT. MD notified. Order for cardio consult and serum mag check. I will continue to assess.

## 2016-06-11 NOTE — Progress Notes (Signed)
[facility] - [place] [facility]   PATIENT NAME: Donyae Kilner    MR#:  [id number]  DATE OF BIRTH:  [birth year]  SUBJECTIVE:  CHIEF COMPLAINT:   Chief Complaint  Patient presents with  . Altered Mental Status   Admitted for respiratory failure with confusion. Found by a neighbor at home. Patient was supposed be on oxygen and not wearing it.The patient was weaned off BiPAP now on oxygen through nasal cannulas, he feels good, admits of some cough, whitish to yellowish sputum production, less shortness of breath, less wheezing. He was noted to have a 15 beat of V. tach/wide-complex tachycardia earlier today. He denied any chest pains or palpitations. He did not feel lightheaded or dizzy, otherwise uncomfortable.. Echocardiogram was normal, but diastolic dysfunction, markedly elevated right sided pressures. Chest CT revealed bilateral pleural effusions. No pulmonary embolism, no pneumonia.  REVIEW OF SYSTEMS:    Review of Systems  Unable to perform ROS: Mental status change  Constitutional: Negative for chills, fever and weight loss.  HENT: Negative for congestion.   Eyes: Negative for blurred vision and double vision.  Respiratory: Positive for cough, sputum production, shortness of breath and wheezing.   Cardiovascular: Negative for chest pain, palpitations, orthopnea, leg swelling and PND.  Gastrointestinal: Negative for abdominal pain, blood in stool, constipation, diarrhea, nausea and vomiting.  Genitourinary: Negative for dysuria, frequency, hematuria and urgency.  Musculoskeletal: Negative for falls.  Neurological: Negative for dizziness, tremors, focal weakness and headaches.  Endo/Heme/Allergies: Does not bruise/bleed easily.  Psychiatric/Behavioral: Negative for depression. The patient does not have insomnia.    DRUG ALLERGIES:  No Known Allergies  VITALS:  Blood pressure 133/62, pulse 77, temperature 98.2 F (36.8 C), temperature source Oral, resp. rate 18,  height (!) 5.8" (0.147 m), weight 99.3 kg (218 lb 14.4 oz), SpO2 93 %.  PHYSICAL EXAMINATION:   Physical Exam  GENERAL:  61 y.o.-year-old patient lying in the bed. Comfortable, smiling EYES: Pupils equal, round, reactive to light and accommodation. No scleral icterus. Extraocular muscles intact.  HEENT: Head atraumatic, normocephalic. Oropharynx and nasopharynx clear.  NECK:  Supple, no jugular venous distention. No thyroid enlargement, no tenderness.  LUNGS: Diminished breath sounds bilaterally, scattered wheezing, better air entrance anteriorly, very poor air entrance posteriorly , especially on the right side CARDIOVASCULAR: S1, S2 normal. No murmurs, rubs, or gallops.  ABDOMEN: Soft, nontender, nondistended. Bowel sounds present. No organomegaly or mass.  EXTREMITIES: No cyanosis, clubbing or edema b/l.    NEUROLOGIC: Cranial nerves II through XII are intact. No focal Motor or sensory deficits b/l.   PSYCHIATRIC: The patient is alert and awake. Marland Kitchen SKIN: No obvious rash, lesion, or ulcer.   LABORATORY PANEL:   CBC  Recent Labs Lab 06/09/16 0559  WBC 4.1  HGB 13.7  HCT 42.0  PLT 235   ------------------------------------------------------------------------------------------------------------------ Chemistries   Recent Labs Lab 06/08/16 1433  06/11/16 0548 06/11/16 1227  NA 136  < > 138  --   K 4.4  < > 4.2  --   CL 99*  < > 91*  --   CO2 34*  < > 41*  --   GLUCOSE 95  < > 141*  --   BUN 50*  < > 25*  --   CREATININE 1.36*  < > 1.09  --   CALCIUM 8.9  < > 8.5*  --   MG  --   < >  --  1.8  AST 33  --   --   --  ALT 113*  --   --   --   ALKPHOS 133*  --   --   --   BILITOT 1.1  --   --   --   < > = values in this interval not displayed. ------------------------------------------------------------------------------------------------------------------  Cardiac Enzymes  Recent Labs Lab 06/08/16 1433  TROPONINI 0.04*    ------------------------------------------------------------------------------------------------------------------  RADIOLOGY:  Ct Angio Chest Pe W Or Wo Contrast  Result Date: 06/11/2016 CLINICAL DATA:  Shortness of breath and hypoxia EXAM: CT ANGIOGRAPHY CHEST WITH CONTRAST TECHNIQUE: Multidetector CT imaging of the chest was performed using the standard protocol during bolus administration of intravenous contrast. Multiplanar CT image reconstructions and MIPs were obtained to evaluate the vascular anatomy. CONTRAST:  75 mL Isovue 370 COMPARISON:  Chest x-ray from earlier in the same day. FINDINGS: Cardiovascular: Thoracic aorta is within normal limits without aneurysmal dilatation or dissection. The pulmonary artery shows a normal branching pattern. No filling defects to suggest pulmonary emboli are identified. No significant coronary calcifications are seen. Cardiac structures are mildly prominent. Mediastinum/Nodes: Thoracic inlet is within normal limits. No significant hilar or mediastinal adenopathy is noted. Lungs/Pleura: Bilateral large pleural effusions are noted right greater than left similar to that seen recent chest x-ray. Associated lower lobe consolidation is also noted right greater left. Upper Abdomen: 15 mm central hypodensity is noted within the left adrenal gland likely representing a small adenoma. No other definitive abnormality of abdomen is seen. Musculoskeletal: Degenerative change of the thoracic spine is noted. Review of the MIP images confirms the above findings. IMPRESSION: No evidence of pulmonary emboli. Bilateral lower lobe consolidation with associated effusions right greater than left. Likely Left adrenal adenoma. Electronically Signed   By: Alcide CleverMark  Lukens M.D.   On: 06/11/2016 12:22   Dg Chest Port 1 View  Result Date: 06/11/2016 CLINICAL DATA:  CHF EXAM: PORTABLE CHEST 1 VIEW COMPARISON:  06/08/2016 FINDINGS: Cardiac shadow is mildly enlarged. Bilateral pleural effusions  right greater than left are noted. No significant vascular congestion is seen. No focal infiltrate is noted. No bony abnormality is seen. IMPRESSION: Bilateral pleural effusions relatively stable from the prior exam Electronically Signed   By: Alcide CleverMark  Lukens M.D.   On: 06/11/2016 07:53     ASSESSMENT AND PLAN:   * Acute on chronic respiratory failure With hypoxia and hypercapnia due to COPD exacerbation and acute on chronic Diastolic CHF. Now, off BiPAP on oxygen via nasal cannulas with good saturations, following CO2 levels and BMP, stable. CT of chest with IV contrast to rule out pulmonary embolism revealed no PE, no pneumonia, but pressure pleural effusions. Discontinue Rocephin and Zithromax intravenously, initiate Zithromax orally. Sputum cultures are not obtained. Continue patient on Nebulizers , budesonide, SVNs, Lasix, improved clinically. Appreciate cardiologist input. Discussed this pulmonologist, may need to repeat ABGs to qualify for BiPAP.     * Acute on chronic diastolic CHF. Continue oral Lasix.  echocardiogram revealed normal ejection fraction, LVH, diastolic dysfunction, severely elevated pulmonary arterial pressures. Monitor input and output. Daily weight. Repeating labs tomorrow  * COPD exacerbation, continue oral steroids, Zithromax orally. Nebulizers,  Inhalers, continue O2 keeping O2 sats at 88-94%, obtaining pulmonary consult, may need to repeat ABGs to qualify for BiPAP  * Hyperkalemia Improved with diuresis.  * Acute encephalopathy due to CO2 narcosis Resolved, now off BiPAP.  * DVT prophylaxis with Lovenox  *Wide-complex tachycardia, possible artifact, per cardiologist, patient's echocardiogram revealed normal ejection fraction, no PE, supportive therapy only, continue telemetry, no antiarrhythmics at present,  we recommended    All the records are reviewed and case discussed with Care Management/Social Workerr. Management plans discussed with the patient, family and  they are in agreement.  CODE STATUS: FULL CODE  DVT Prophylaxis: SCDs  TOTAL CC TIME TAKING CARE OF THIS PATIENT: 35 minutes.   POSSIBLE D/C IN 3-4 DAYS, DEPENDING ON CLINICAL CONDITION.  [name] M.D on 06/11/2016 at 3:05 PM  Between 7am to 6pm - Pager - [phone]  After 6pm go to [email or web address] - password [email or web address] [facility]  [facility] [place] [facility]  Office  [phone]  CC: Primary care physician; Marguarite Arbour, MD  Note: This dictation was prepared with Dragon dictation along with smaller phrase technology. Any transcriptional errors that result from this process are unintentional.

## 2016-06-11 NOTE — Progress Notes (Signed)
SATURATION QUALIFICATIONS: (This note is used to comply with regulatory documentation for home oxygen)  Patient Saturations on Room Air at Rest = 87%  Patient Saturations on Room Air while Ambulating = n/a%  Patient Saturations on 2 Liters of oxygen while resting = 92%  Please briefly explain why patient needs home oxygen: 

## 2016-06-12 DIAGNOSIS — R000 Tachycardia, unspecified: Secondary | ICD-10-CM

## 2016-06-12 DIAGNOSIS — G9341 Metabolic encephalopathy: Secondary | ICD-10-CM

## 2016-06-12 DIAGNOSIS — I5033 Acute on chronic diastolic (congestive) heart failure: Secondary | ICD-10-CM

## 2016-06-12 DIAGNOSIS — J189 Pneumonia, unspecified organism: Secondary | ICD-10-CM

## 2016-06-12 DIAGNOSIS — J181 Lobar pneumonia, unspecified organism: Secondary | ICD-10-CM

## 2016-06-12 DIAGNOSIS — I472 Ventricular tachycardia: Secondary | ICD-10-CM

## 2016-06-12 DIAGNOSIS — E875 Hyperkalemia: Secondary | ICD-10-CM

## 2016-06-12 LAB — GLUCOSE, CAPILLARY
GLUCOSE-CAPILLARY: 74 mg/dL (ref 65–99)
Glucose-Capillary: 111 mg/dL — ABNORMAL HIGH (ref 65–99)

## 2016-06-12 LAB — VITAMIN B1: VITAMIN B1 (THIAMINE): 128.5 nmol/L (ref 66.5–200.0)

## 2016-06-12 MED ORDER — POTASSIUM CHLORIDE CRYS ER 10 MEQ PO TBCR: 10.0000 meq | EXTENDED_RELEASE_TABLET | Freq: Two times a day (BID) | ORAL | 6 refills | Status: AC

## 2016-06-12 MED ORDER — DOXYCYCLINE HYCLATE 100 MG PO CAPS: 100.0000 mg | ORAL_CAPSULE | Freq: Two times a day (BID) | ORAL | 0 refills | Status: DC

## 2016-06-12 MED ORDER — PREDNISONE 10 MG (21) PO TBPK: 10.0000 mg | ORAL_TABLET | Freq: Every day | ORAL | 0 refills | Status: DC

## 2016-06-12 MED ORDER — FUROSEMIDE 40 MG PO TABS: 40.0000 mg | ORAL_TABLET | Freq: Every day | ORAL | 5 refills | Status: AC

## 2016-06-12 MED ORDER — ALBUTEROL SULFATE (2.5 MG/3ML) 0.083% IN NEBU: 2.5000 mg | INHALATION_SOLUTION | RESPIRATORY_TRACT | 12 refills | Status: DC | PRN

## 2016-06-12 NOTE — Discharge Summary (Signed)
[facility] - [place] at [facility]   PATIENT NAME: Miguel Howell    MR#:  [id number]  DATE OF BIRTH:  November 05, 1955  DATE OF ADMISSION:  06/08/2016 ADMITTING PHYSICIAN: Katha Hamming, MD  DATE OF DISCHARGE: 06/12/2016  2:45 PM  PRIMARY CARE PHYSICIAN: SPARKS,JEFFREY D, MD     ADMISSION DIAGNOSIS:  Acute on chronic respiratory failure with hypercapnia (HCC) [J96.22]  DISCHARGE DIAGNOSIS:  Principal Problem:   Acute on chronic respiratory failure with hypoxia and hypercapnia (HCC) Active Problems:   Acute on chronic diastolic CHF (congestive heart failure) (HCC)   Pneumonia   Hyperkalemia   Encephalopathy, metabolic   Wide-complex tachycardia (HCC)   SECONDARY DIAGNOSIS:   Past Medical History:  Diagnosis Date  . Asthma   . COPD (chronic obstructive pulmonary disease) (HCC)   . Hypertension     .pro HOSPITAL COURSE:   Patient is a 61 year old Caucasian male with history of COPD, chronic respiratory failure, recently started on oxygen therapy, hypertension, who presents to the hospital after he was found to be unresponsive at home. The patient was brought to emergency room for further evaluation and treatment. He was found to have severe hypercapnia with PCO2 of more than 100, he was started on BiPAP and admitted to the hospital. Initial chest x-ray revealed cardiomegaly, interstitial edema, bilateral pleural effusions. Patient was initiated on Lasix and improved. Echocardiogram revealed normal ejection fraction, grade 2 diastolic dysfunction, no wall motion abnormalities, severe elevated pulmonary arterial pressures. Patient was treated for possible COPD exacerbation as well. He was felt to be stable to be discharged home today. Discussion by problem: * Acute on chronic respiratory failure with hypoxia and hypercapnia due to COPD exacerbation and acute on chronic Diastolic CHF. The patient was diuresed, weaned off BiPAP and now on  2 L oxygen via nasal  cannulas with good saturations. CT of chest with IV contrast to rule out pulmonary embolism revealed no PE, no pneumonia, but the lateral pleural effusions. The patient is to continue Lasix, antibiotic therapy with doxycycline, nebulizing therapy, inhalers, he is to follow-up with pulmonologist as outpatient. The patient was seen by cardiologist, conservative therapy was recommended     * Acute on chronic diastolic CHF. Continue oral Lasix.  echocardiogram revealed normal ejection fraction, LVH, diastolic dysfunction, severely elevated pulmonary arterial pressures. The patient is to follow up with cardiologist as outpatient for further recommendations. He is to continue Coreg, Lasix as outpatient, blood pressure is good as well as his heart rate   * COPD exacerbation, continue oral steroid taper, doxycycline orally. Nebulizers,  Inhalers, continue O2 keeping O2 sats at 88-94%, patient would benefit from pulmonary follow-up as outpatient  * Hyperkalemia Resolved with diuresis.  * Acute encephalopathy due to CO2 narcosis Resolved, now off BiPAP. Patient was advised to keep O2 sats at 88-92%.   *Wide-complex tachycardia, possible artifact, per cardiologist, patient's echocardiogram revealed normal ejection fraction, no PE on CT of chest, supportive therapy only,  no antiarrhythmics were recommended, continue Coreg DISCHARGE CONDITIONS:   Stable  CONSULTS OBTAINED:  Treatment Team:  Marcina Millard, MD Mertie Moores, MD  DRUG ALLERGIES:  No Known Allergies  DISCHARGE MEDICATIONS:   Discharge Medication List as of 06/12/2016  1:40 PM    START taking these medications   Details  albuterol (PROVENTIL) (2.5 MG/3ML) 0.083% nebulizer solution Take 3 mLs (2.5 mg total) by nebulization every 4 (four) hours as needed for wheezing or shortness of breath., Starting Mon 06/12/2016, Normal  doxycycline (VIBRAMYCIN) 100 MG capsule Take 1 capsule (100 mg total) by mouth 2 (two) times  daily., Starting Mon 06/12/2016, Normal    furosemide (LASIX) 40 MG tablet Take 1 tablet (40 mg total) by mouth daily., Starting Mon 06/12/2016, Normal      CONTINUE these medications which have CHANGED   Details  potassium chloride (K-DUR,KLOR-CON) 10 MEQ tablet Take 1 tablet (10 mEq total) by mouth 2 (two) times daily., Starting Mon 06/12/2016, Normal    predniSONE (STERAPRED UNI-PAK 21 TAB) 10 MG (21) TBPK tablet Take 1 tablet (10 mg total) by mouth daily. Please take 6 pills in the morning on the day 1, then taper by one pill daily until finished, thank you, Starting Mon 06/12/2016, Normal      CONTINUE these medications which have NOT CHANGED   Details  carvedilol (COREG) 6.25 MG tablet Take 6.25 mg by mouth 2 (two) times daily., Starting Thu 02/17/2016, Historical Med    guaiFENesin-dextromethorphan (ROBITUSSIN DM) 100-10 MG/5ML syrup Take 10 mLs by mouth every 6 (six) hours as needed for cough., Starting Wed 06/07/2016, OTC    LORazepam (ATIVAN) 1 MG tablet Take 1 mg by mouth at bedtime as needed for sleep., Starting Thu 02/17/2016, Historical Med    menthol-cetylpyridinium (CEPACOL) 3 MG lozenge Take 1 lozenge (3 mg total) by mouth as needed for sore throat., Starting Wed 06/07/2016, OTC    PROAIR HFA 108 (90 Base) MCG/ACT inhaler Inhale 2 puffs into the lungs every 4 (four) hours as needed for shortness of breath., Starting Sun 03/05/2016, Historical Med    ranitidine (ZANTAC) 150 MG tablet Take 150 mg by mouth daily., Historical Med    senna (SENOKOT) 8.6 MG TABS tablet Take 1 tablet (8.6 mg total) by mouth daily as needed for mild constipation., Starting Wed 06/07/2016, OTC    SPIRIVA HANDIHALER 18 MCG inhalation capsule Place 1 capsule into inhaler and inhale daily., Starting Thu 03/09/2016, Historical Med    SYMBICORT 160-4.5 MCG/ACT inhaler Inhale 2 puffs into the lungs 2 (two) times daily., Starting Mon 02/07/2016, Historical Med      STOP taking these medications      amoxicillin-clavulanate (AUGMENTIN) 875-125 MG tablet          DISCHARGE INSTRUCTIONS:    The patient is to follow-up with primary care physician as outpatient, he would benefit from pulmonologist follow-up  If you experience worsening of your admission symptoms, develop shortness of breath, life threatening emergency, suicidal or homicidal thoughts you must seek medical attention immediately by calling 911 or calling your MD immediately  if symptoms less severe.  You Must read complete instructions/literature along with all the possible adverse reactions/side effects for all the Medicines you take and that have been prescribed to you. Take any new Medicines after you have completely understood and accept all the possible adverse reactions/side effects.   Please note  You were cared for by a hospitalist during your hospital stay. If you have any questions about your discharge medications or the care you received while you were in the hospital after you are discharged, you can call the unit and asked to speak with the hospitalist on call if the hospitalist that took care of you is not available. Once you are discharged, your primary care physician will handle any further medical issues. Please note that NO REFILLS for any discharge medications will be authorized once you are discharged, as it is imperative that you return to your primary care physician (or establish a relationship with  a primary care physician if you do not have one) for your aftercare needs so that they can reassess your need for medications and monitor your lab values.    Today   CHIEF COMPLAINT:   Chief Complaint  Patient presents with  . Altered Mental Status    HISTORY OF PRESENT ILLNESS:  Miguel Howell  is a 61 y.o. male with a known history of COPD, chronic respiratory failure, recently started on oxygen therapy, hypertension, who presents to the hospital after he was found to be unresponsive at home. The patient  was brought to emergency room for further evaluation and treatment. He was found to have severe hypercapnia with PCO2 of more than 100, he was started on BiPAP and admitted to the hospital. Initial chest x-ray revealed cardiomegaly, interstitial edema, bilateral pleural effusions. Patient was initiated on Lasix and improved. Echocardiogram revealed normal ejection fraction, grade 2 diastolic dysfunction, no wall motion abnormalities, severe elevated pulmonary arterial pressures. Patient was treated for possible COPD exacerbation as well. He was felt to be stable to be discharged home today. Discussion by problem: * Acute on chronic respiratory failure with hypoxia and hypercapnia due to COPD exacerbation and acute on chronic Diastolic CHF. The patient was diuresed, weaned off BiPAP and now on  2 L oxygen via nasal cannulas with good saturations. CT of chest with IV contrast to rule out pulmonary embolism revealed no PE, no pneumonia, but the lateral pleural effusions. The patient is to continue Lasix, antibiotic therapy with doxycycline, nebulizing therapy, inhalers, he is to follow-up with pulmonologist as outpatient. The patient was seen by cardiologist, conservative therapy was recommended     * Acute on chronic diastolic CHF. Continue oral Lasix.  echocardiogram revealed normal ejection fraction, LVH, diastolic dysfunction, severely elevated pulmonary arterial pressures. The patient is to follow up with cardiologist as outpatient for further recommendations. He is to continue Coreg, Lasix as outpatient, blood pressure is good as well as his heart rate   * COPD exacerbation, continue oral steroid taper, doxycycline orally. Nebulizers,  Inhalers, continue O2 keeping O2 sats at 88-94%, patient would benefit from pulmonary follow-up as outpatient  * Hyperkalemia Resolved with diuresis.  * Acute encephalopathy due to CO2 narcosis Resolved, now off BiPAP. Patient was advised to keep O2 sats at  88-92%.   *Wide-complex tachycardia, possible artifact, per cardiologist, patient's echocardiogram revealed normal ejection fraction, no PE on CT of chest, supportive therapy only,  no antiarrhythmics were recommended, continue Coreg   VITAL SIGNS:  Blood pressure 126/65, pulse 88, temperature 98.3 F (36.8 C), temperature source Oral, resp. rate 18, height (!) 5.8" (0.147 m), weight 98.4 kg (217 lb), SpO2 96 %.  I/O:   Intake/Output Summary (Last 24 hours) at 06/12/16 1516 Last data filed at 06/12/16 1012  Gross per 24 hour  Intake              490 ml  Output             1530 ml  Net            -1040 ml    PHYSICAL EXAMINATION:  GENERAL:  61 y.o.-year-old patient lying in the bed with no acute distress.  EYES: Pupils equal, round, reactive to light and accommodation. No scleral icterus. Extraocular muscles intact.  HEENT: Head atraumatic, normocephalic. Oropharynx and nasopharynx clear.  NECK:  Supple, no jugular venous distention. No thyroid enlargement, no tenderness.  LUNGS: Normal breath sounds bilaterally, no wheezing, rales,rhonchi or crepitation. No use  of accessory muscles of respiration.  CARDIOVASCULAR: S1, S2 normal. No murmurs, rubs, or gallops.  ABDOMEN: Soft, non-tender, non-distended. Bowel sounds present. No organomegaly or mass.  EXTREMITIES: No pedal edema, cyanosis, or clubbing.  NEUROLOGIC: Cranial nerves II through XII are intact. Muscle strength 5/5 in all extremities. Sensation intact. Gait not checked.  PSYCHIATRIC: The patient is alert and oriented x 3.  SKIN: No obvious rash, lesion, or ulcer.   DATA REVIEW:   CBC  Recent Labs Lab 06/09/16 0559  WBC 4.1  HGB 13.7  HCT 42.0  PLT 235    Chemistries   Recent Labs Lab 06/08/16 1433  06/11/16 0548 06/11/16 1227  NA 136  < > 138  --   K 4.4  < > 4.2  --   CL 99*  < > 91*  --   CO2 34*  < > 41*  --   GLUCOSE 95  < > 141*  --   BUN 50*  < > 25*  --   CREATININE 1.36*  < > 1.09  --    CALCIUM 8.9  < > 8.5*  --   MG  --   < >  --  1.8  AST 33  --   --   --   ALT 113*  --   --   --   ALKPHOS 133*  --   --   --   BILITOT 1.1  --   --   --   < > = values in this interval not displayed.  Cardiac Enzymes  Recent Labs Lab 06/08/16 1433  TROPONINI 0.04*    Microbiology Results  Results for orders placed or performed during the hospital encounter of 06/08/16  MRSA PCR Screening     Status: None   Collection Time: 06/09/16  4:45 PM  Result Value Ref Range Status   MRSA by PCR NEGATIVE NEGATIVE Final    Comment:        The GeneXpert MRSA Assay (FDA approved for NASAL specimens only), is one component of a comprehensive MRSA colonization surveillance program. It is not intended to diagnose MRSA infection nor to guide or monitor treatment for MRSA infections.     RADIOLOGY:  Ct Angio Chest Pe W Or Wo Contrast  Result Date: 06/11/2016 CLINICAL DATA:  Shortness of breath and hypoxia EXAM: CT ANGIOGRAPHY CHEST WITH CONTRAST TECHNIQUE: Multidetector CT imaging of the chest was performed using the standard protocol during bolus administration of intravenous contrast. Multiplanar CT image reconstructions and MIPs were obtained to evaluate the vascular anatomy. CONTRAST:  75 mL Isovue 370 COMPARISON:  Chest x-ray from earlier in the same day. FINDINGS: Cardiovascular: Thoracic aorta is within normal limits without aneurysmal dilatation or dissection. The pulmonary artery shows a normal branching pattern. No filling defects to suggest pulmonary emboli are identified. No significant coronary calcifications are seen. Cardiac structures are mildly prominent. Mediastinum/Nodes: Thoracic inlet is within normal limits. No significant hilar or mediastinal adenopathy is noted. Lungs/Pleura: Bilateral large pleural effusions are noted right greater than left similar to that seen recent chest x-ray. Associated lower lobe consolidation is also noted right greater left. Upper Abdomen: 15 mm  central hypodensity is noted within the left adrenal gland likely representing a small adenoma. No other definitive abnormality of abdomen is seen. Musculoskeletal: Degenerative change of the thoracic spine is noted. Review of the MIP images confirms the above findings. IMPRESSION: No evidence of pulmonary emboli. Bilateral lower lobe consolidation with associated effusions right greater than  left. Likely Left adrenal adenoma. Electronically Signed   By: Alcide CleverMark  Lukens M.D.   On: 06/11/2016 12:22   Dg Chest Port 1 View  Result Date: 06/11/2016 CLINICAL DATA:  CHF EXAM: PORTABLE CHEST 1 VIEW COMPARISON:  06/08/2016 FINDINGS: Cardiac shadow is mildly enlarged. Bilateral pleural effusions right greater than left are noted. No significant vascular congestion is seen. No focal infiltrate is noted. No bony abnormality is seen. IMPRESSION: Bilateral pleural effusions relatively stable from the prior exam Electronically Signed   By: Alcide CleverMark  Lukens M.D.   On: 06/11/2016 07:53    EKG:   Orders placed or performed during the hospital encounter of 06/08/16  . ED EKG  . ED EKG  . EKG 12-Lead  . EKG 12-Lead      Management plans discussed with the patient, family and they are in agreement.  CODE STATUS:     Code Status Orders        Start     Ordered   06/08/16 1722  Full code  Continuous     06/08/16 1722    Code Status History    Date Active Date Inactive Code Status Order ID Comments User Context   06/04/2016 10:48 PM 06/07/2016  6:55 PM Full Code [id number]  Houston SirenVivek J Sainani, MD Inpatient   03/25/2016  2:46 AM 03/26/2016  3:18 PM Full Code [id number]  Oralia Manisavid Willis, MD Inpatient      TOTAL TIME TAKING CARE OF THIS PATIENT: 40 minutes.    Katharina CaperVAICKUTE,Livingston Denner M.D on 06/12/2016 at 3:16 PM  Between 7am to 6pm - Pager - [phone]  After 6pm go to [email or web address] - password [email or web address] [facility]  [facility]  Office  [phone]  CC: Primary care physician; Marguarite ArbourSPARKS,JEFFREY D, MD

## 2016-06-12 NOTE — Progress Notes (Signed)
IVs and tele removed from patient. Discharge instructions given to patient along with hard copy prescription. Patient and family verbalized understanding. Oxygen brought in by family member, patient hooked up to 2L, in no distress at this time. Family is at bedside and will be transporting patient home.

## 2016-06-12 NOTE — Care Management (Signed)
Patient to discharge home today and his sister will transport him to [place] spo she can assist patient.  Patient has verbalized that "no one showed him how to use his oxygen when it was delivered to his home."  Patient was discharged home 1.3.2018 on new home oxygen.  he was found altered in his home on day of admission and was not wearing his oxygen. [facility] but agency is not in network with patient's insurance for home health.  [facility]  Awaiting call back.  discussed the need to transport all of patient's oxygen and concentrator with him to [place].  [facility] does not have staff availability to provide home health nursing.  Informed that [facility] may serve the area.  Patient's sister Breck CoonsDiane Church [address] [place] [id number].  Phone [phone].  Patient left the unit before CM could obtain a phone contact for [facility] and before could set up any home health nursing services

## 2016-06-12 NOTE — Care Management (Signed)
Informed that patient has nebulizer solution scripts but no home nebulizer.  Found a script present in EPIC.  Filled by [facility].  [facility].  Provided patient and his sister with phone number for [facility] and informed so far have not located  home health agency.   Diane says she has a friend that works for [facility] and told her to call.  Informed her that this is the agency that told CM could not staff the case.

## 2018-01-31 IMAGING — US US ABDOMEN LIMITED
1 series · 14 of 25 positions shown · non-contrast
Comparison: 10/02/2008

CLINICAL DATA: Abnormal liver enzymes.  Abdominal pain.

EXAM:
US ABDOMEN LIMITED - RIGHT UPPER QUADRANT

[Series 1: us abdomen limited · 0.25mm/px · 14 of 60 slices shown]
[im 1/60]
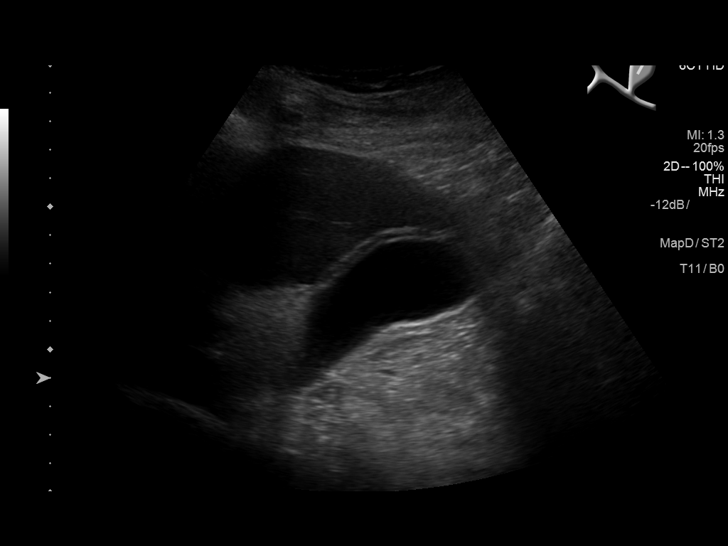
[im 5/60]
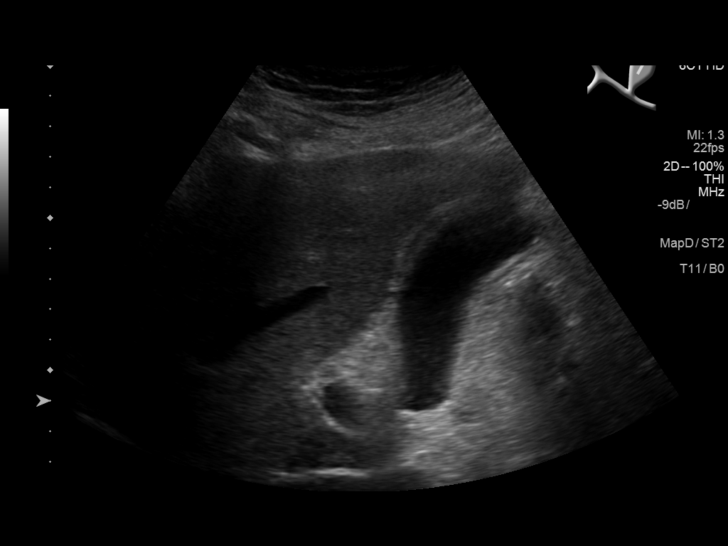
[im 10/60]
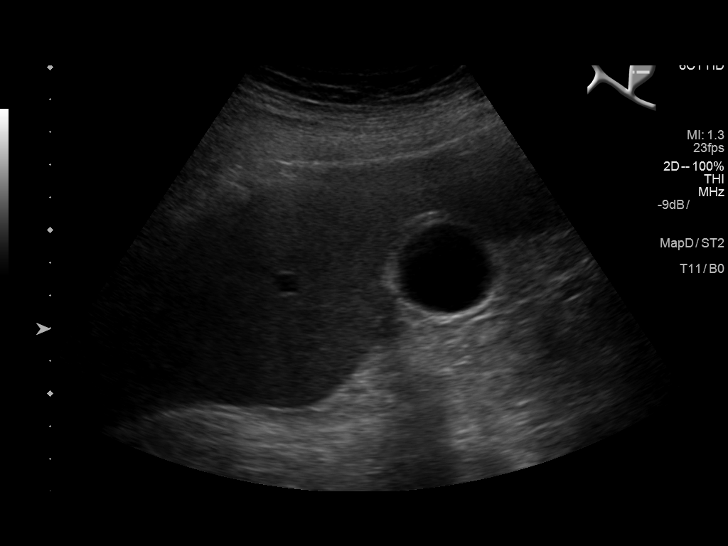
[im 15/60]
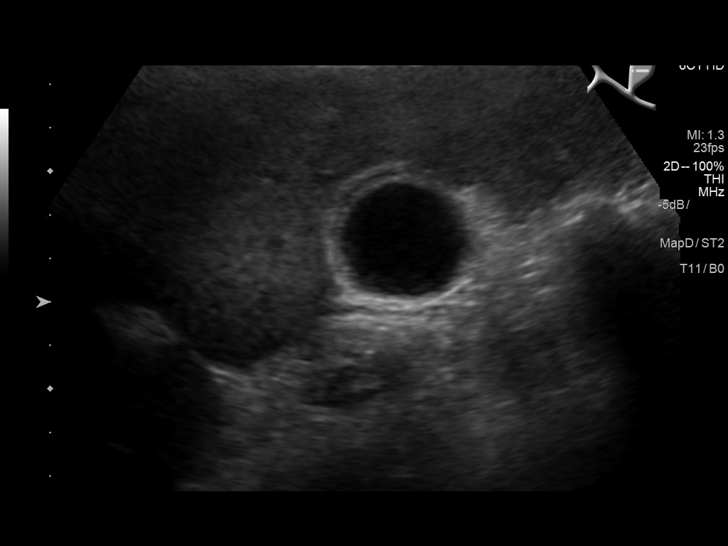
[im 20/60]
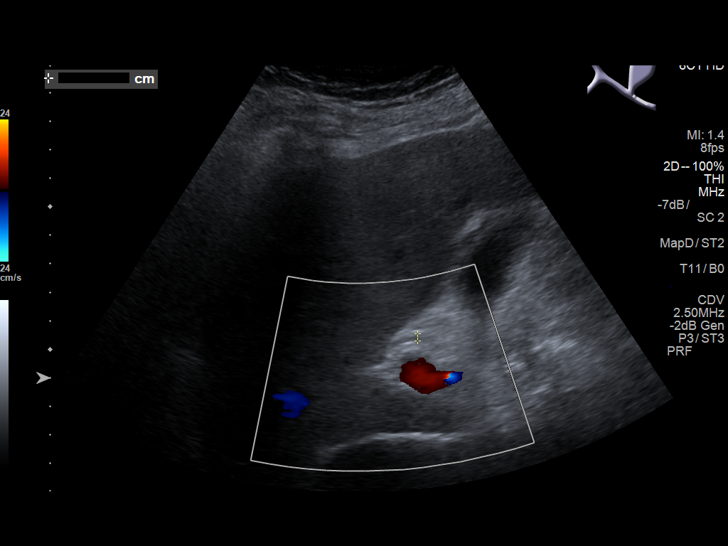
[im 23/60]
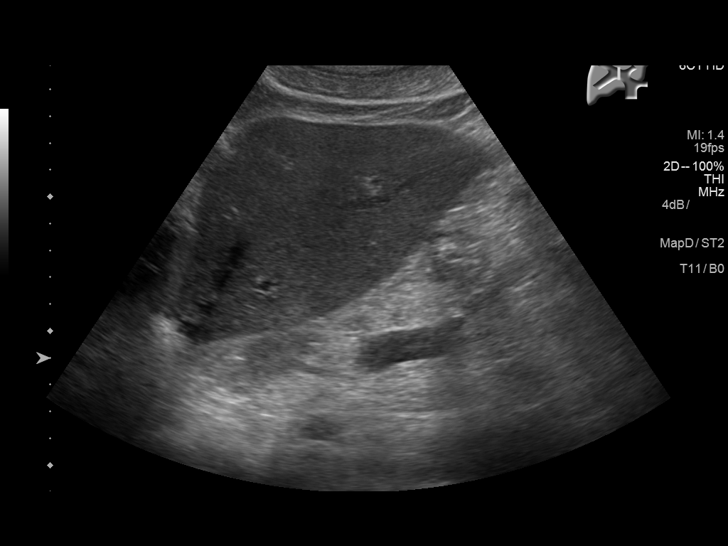
[im 28/60]
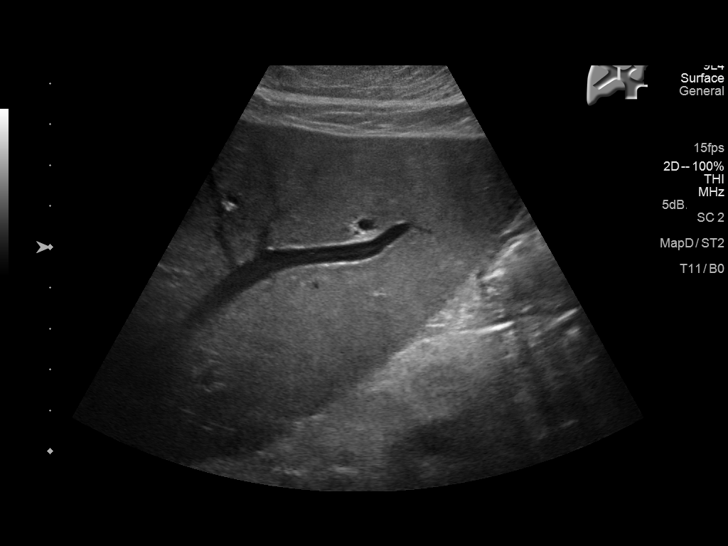
[im 32/60]
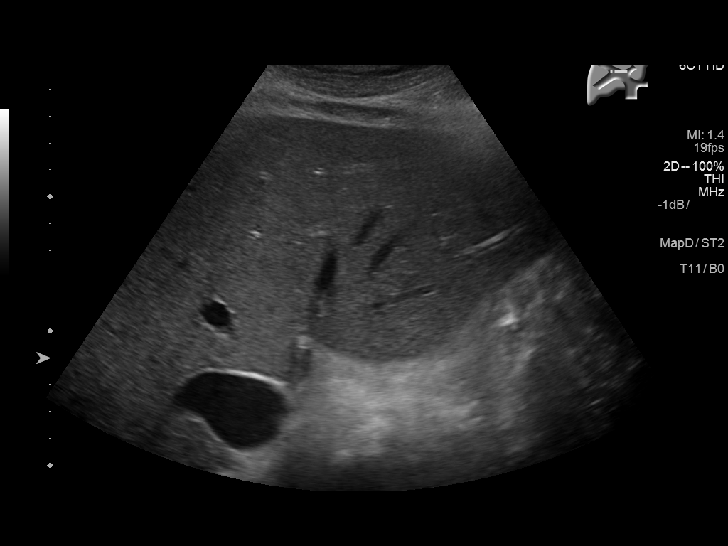
[im 37/60]
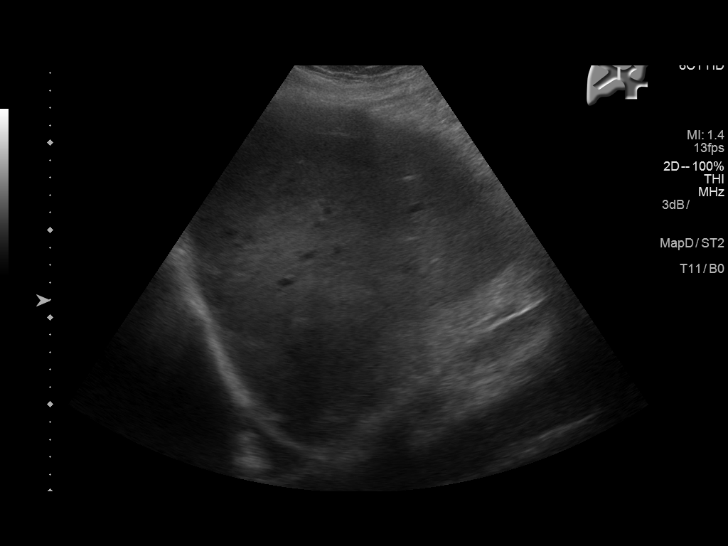
[im 40/60]
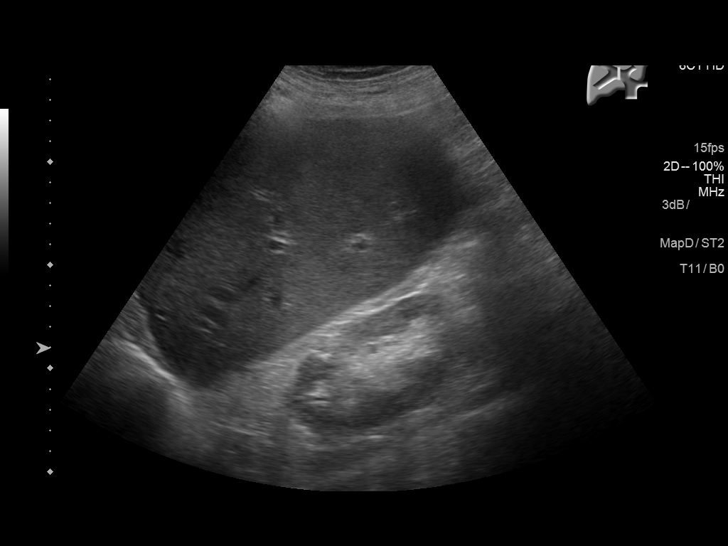
[im 45/60]
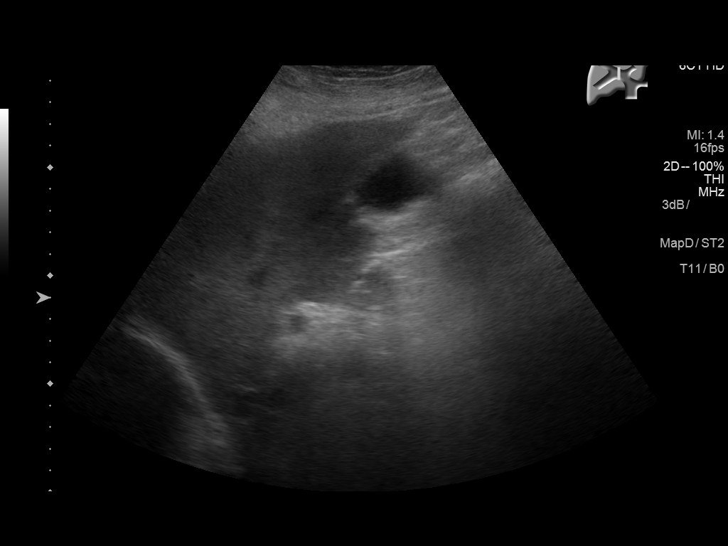
[im 50/60]
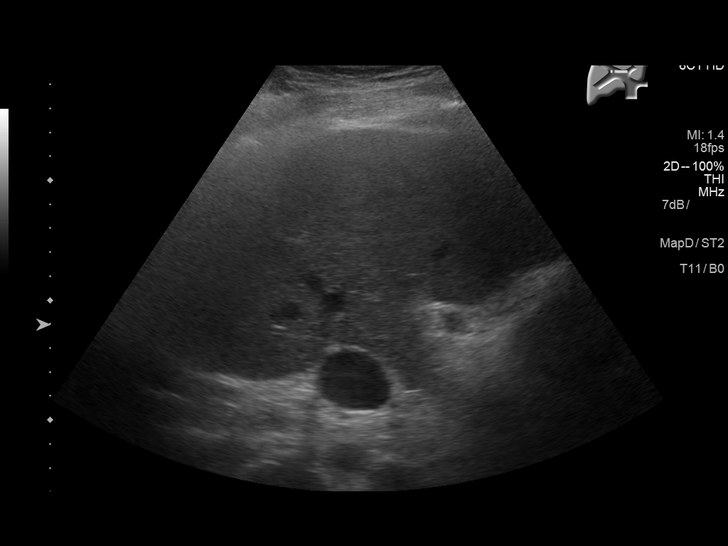
[im 55/60]
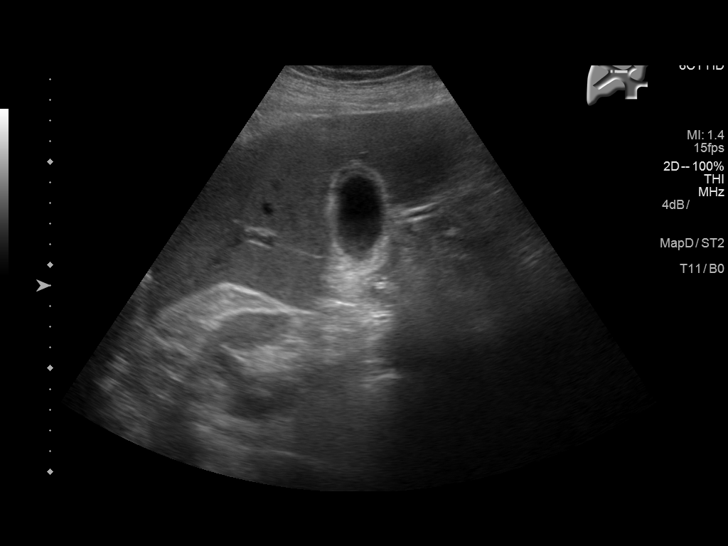
[im 60/60]
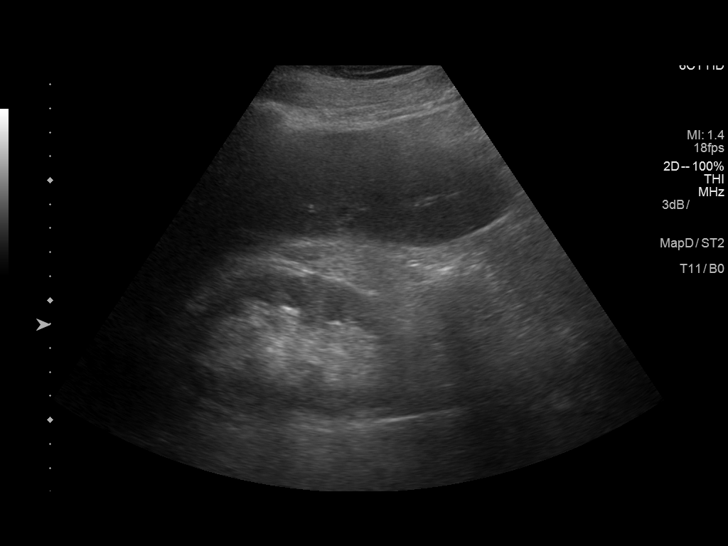

[14 of 25 positions shown; findings below may reference images not displayed]

FINDINGS: Gallbladder:

Gallbladder wall is thickened with hypoechoic edema measuring
mm. Gallbladder sludge is present. No pericholecystic fluid. No
sonographic Murphy's sign.

Common bile duct:

Diameter: 2.5 mm

Liver:

No focal liver lesions are identified. Mildly increased echotexture
of the liver.

On Doppler evaluation of the portal vein, there is color Doppler
evidence for bidirectional flow.
IMPRESSION: 1. Gallbladder wall thickening and gallbladder sludge, nonspecific
findings. No other evidence for acute cholecystitis.
2. Mildly echogenic liver.
3. Possible bidirectional flow within the portal vein. Consider
dedicated Doppler evaluation of the portal vein to evaluate for
portal venous hypertension.

## 2018-02-16 IMAGING — DX DG CHEST 1V PORT
1 series · 1 of 1 positions shown · non-contrast
Comparison: 03/24/2016

CLINICAL DATA: Patient arrives from home via POV with complaint of
generalized weakness x 3-4 days, slurred speech. During triage
assessment, patient noted to be pale appearing with hypoxia, pale
mucous membranes, and hypotensive. Hx/o COPD and HTN. Former smoker.

EXAM:
PORTABLE CHEST 1 VIEW

[chest ap]
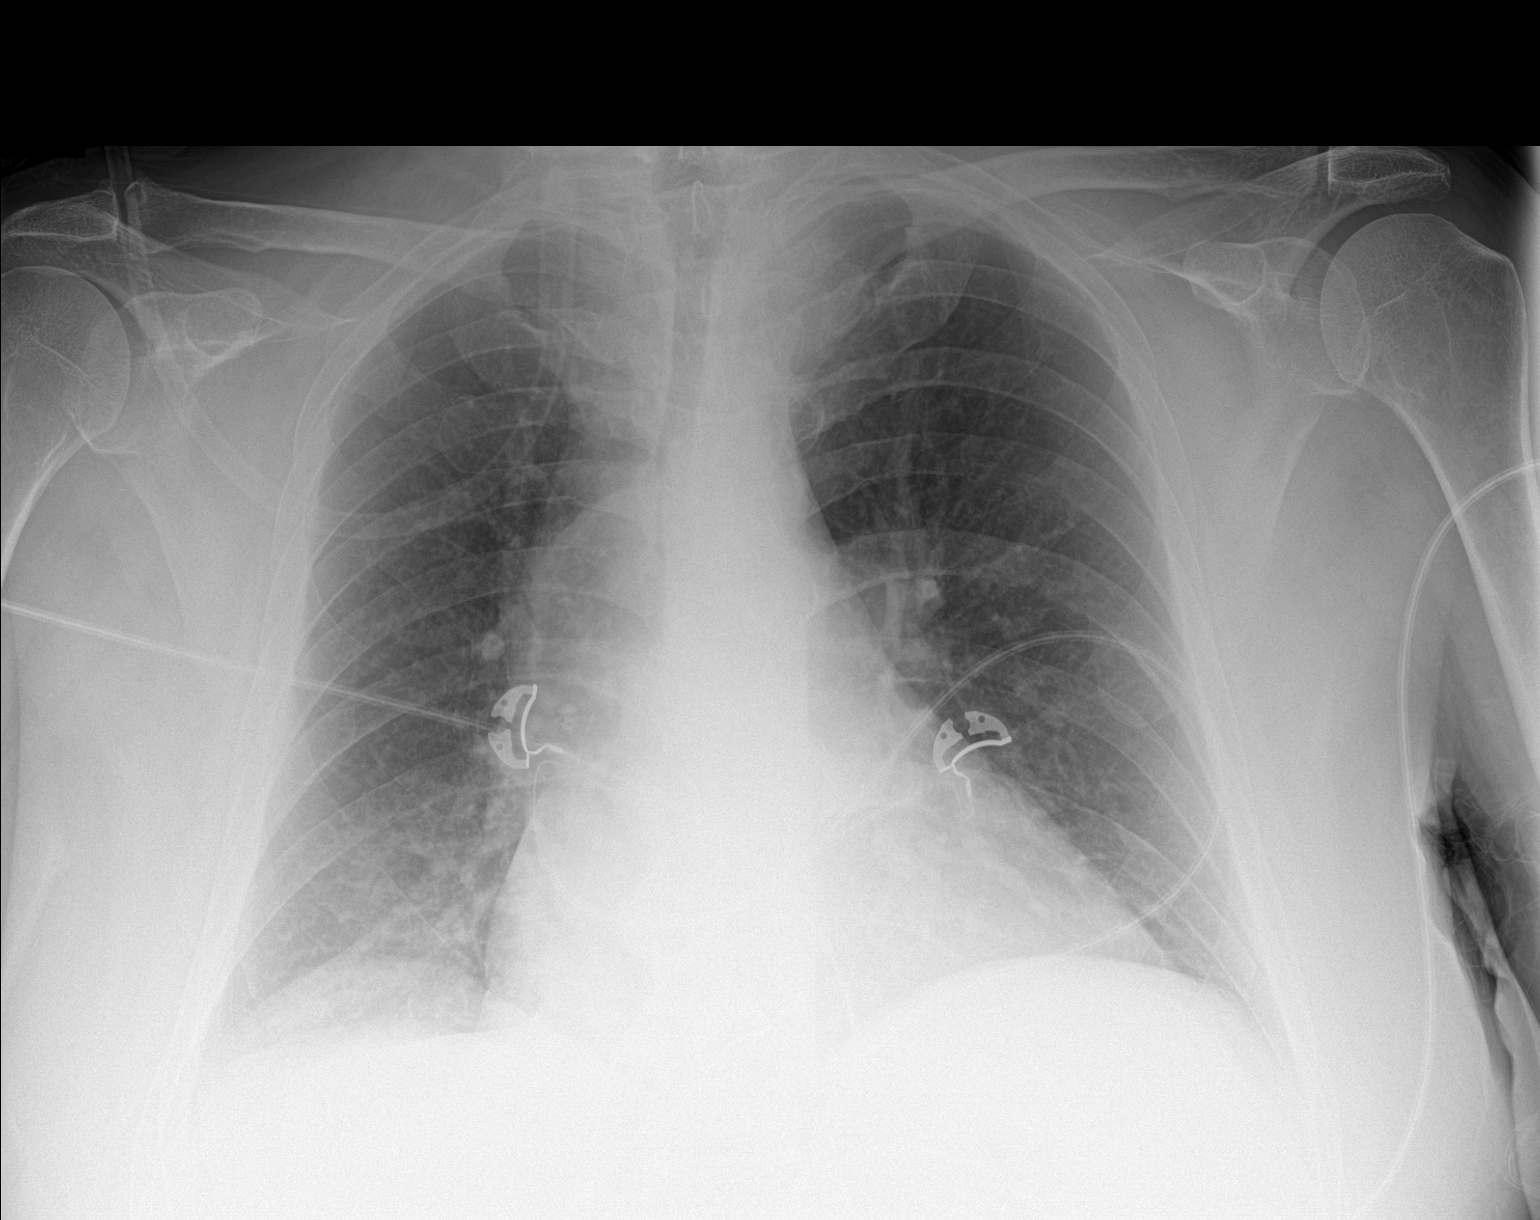

[1 of 1 positions shown; findings below may reference images not displayed]

FINDINGS: Heart size is normal. No focal consolidations. There are slightly
prominent markings at the bases, right greater than left raising the
question of shallow inflation versus early infiltrate. Consider
follow-up as needed to evaluate for infiltrate.
IMPRESSION: Question early infiltrate at the bases, right greater than left.
Follow-up is recommended.

## 2018-05-07 ENCOUNTER — Encounter: Payer: Self-pay | Admitting: Emergency Medicine

## 2018-05-07 ENCOUNTER — Emergency Department: Payer: BLUE CROSS/BLUE SHIELD

## 2018-05-07 ENCOUNTER — Inpatient Hospital Stay
Admission: EM | Admit: 2018-05-07 | Discharge: 2018-05-10 | DRG: 190 | Disposition: A | Discharge status: Home / Self Care | Payer: BLUE CROSS/BLUE SHIELD | Attending: Internal Medicine | Admitting: Internal Medicine

## 2018-05-07 ENCOUNTER — Other Ambulatory Visit: Payer: Self-pay

## 2018-05-07 DIAGNOSIS — Z79899 Other long term (current) drug therapy: Secondary | ICD-10-CM | POA: Diagnosis not present

## 2018-05-07 DIAGNOSIS — Z7951 Long term (current) use of inhaled steroids: Secondary | ICD-10-CM

## 2018-05-07 DIAGNOSIS — Z87891 Personal history of nicotine dependence: Secondary | ICD-10-CM

## 2018-05-07 DIAGNOSIS — Z833 Family history of diabetes mellitus: Secondary | ICD-10-CM

## 2018-05-07 DIAGNOSIS — I5032 Chronic diastolic (congestive) heart failure: Secondary | ICD-10-CM | POA: Diagnosis present

## 2018-05-07 DIAGNOSIS — Z825 Family history of asthma and other chronic lower respiratory diseases: Secondary | ICD-10-CM

## 2018-05-07 DIAGNOSIS — R0902 Hypoxemia: Secondary | ICD-10-CM

## 2018-05-07 DIAGNOSIS — Z8249 Family history of ischemic heart disease and other diseases of the circulatory system: Secondary | ICD-10-CM | POA: Diagnosis not present

## 2018-05-07 DIAGNOSIS — J441 Chronic obstructive pulmonary disease with (acute) exacerbation: Secondary | ICD-10-CM | POA: Diagnosis present

## 2018-05-07 DIAGNOSIS — J9601 Acute respiratory failure with hypoxia: Secondary | ICD-10-CM | POA: Diagnosis present

## 2018-05-07 DIAGNOSIS — I110 Hypertensive heart disease with heart failure: Secondary | ICD-10-CM | POA: Diagnosis present

## 2018-05-07 LAB — BASIC METABOLIC PANEL
Anion gap: 6 (ref 5–15)
BUN: 15 mg/dL (ref 6–20)
CALCIUM: 9.1 mg/dL (ref 8.9–10.3)
CO2: 31 mmol/L (ref 22–32)
CREATININE: 1.1 mg/dL (ref 0.61–1.24)
Chloride: 98 mmol/L (ref 98–111)
GFR calc Af Amer: >60 mL/min (ref >60)
GLUCOSE: 109 mg/dL — AB (ref 70–99)
Potassium: 4.3 mmol/L (ref 3.5–5.1)
Sodium: 135 mmol/L (ref 135–145)

## 2018-05-07 LAB — CBC
HCT: 51.4 % (ref 39.0–52.0)
Hemoglobin: 17.1 g/dL — ABNORMAL HIGH (ref 13.0–17.0)
MCH: 34.9 pg — AB (ref 26.0–34.0)
MCHC: 33.3 g/dL (ref 30.0–36.0)
MCV: 104.9 fL — ABNORMAL HIGH (ref 80.0–100.0)
PLATELETS: 184 10*3/uL (ref 150–400)
RBC: 4.9 MIL/uL (ref 4.22–5.81)
RDW: 13.7 % (ref 11.5–15.5)
WBC: 7.6 10*3/uL (ref 4.0–10.5)
nRBC: 0 % (ref 0.0–0.2)

## 2018-05-07 LAB — INFLUENZA PANEL BY PCR (TYPE A & B)
Influenza A By PCR: NEGATIVE
Influenza B By PCR: NEGATIVE

## 2018-05-07 LAB — TROPONIN I

## 2018-05-07 MED ORDER — CARVEDILOL 3.125 MG PO TABS
3.1250 mg | ORAL_TABLET | Freq: Two times a day (BID) | ORAL | Status: DC
  Administered 2018-05-07 – 2018-05-10 (×6): 3.125 mg via ORAL
  Filled 2018-05-07 (×6): qty 1

## 2018-05-07 MED ORDER — TIOTROPIUM BROMIDE MONOHYDRATE 18 MCG IN CAPS
18.0000 ug | ORAL_CAPSULE | Freq: Every morning | RESPIRATORY_TRACT | Status: DC
  Administered 2018-05-08 – 2018-05-10 (×3): 18 ug via RESPIRATORY_TRACT
  Filled 2018-05-07: qty 5

## 2018-05-07 MED ORDER — NICOTINE 21 MG/24HR TD PT24
21.0000 mg | MEDICATED_PATCH | Freq: Every day | TRANSDERMAL | Status: DC
  Filled 2018-05-07 (×2): qty 1

## 2018-05-07 MED ORDER — ASPIRIN EC 81 MG PO TBEC
81.0000 mg | DELAYED_RELEASE_TABLET | Freq: Every day | ORAL | Status: DC
  Administered 2018-05-08 – 2018-05-10 (×3): 81 mg via ORAL
  Filled 2018-05-07 (×3): qty 1

## 2018-05-07 MED ORDER — DOXYCYCLINE HYCLATE 100 MG PO TABS
100.0000 mg | ORAL_TABLET | Freq: Two times a day (BID) | ORAL | Status: DC
  Administered 2018-05-07 – 2018-05-10 (×6): 100 mg via ORAL
  Filled 2018-05-07 (×6): qty 1

## 2018-05-07 MED ORDER — DIAZEPAM 5 MG PO TABS: 10.0000 mg | ORAL_TABLET | Freq: Every evening | ORAL | Status: DC | PRN

## 2018-05-07 MED ORDER — IPRATROPIUM-ALBUTEROL 0.5-2.5 (3) MG/3ML IN SOLN
3.0000 mL | Freq: Once | RESPIRATORY_TRACT | Status: AC
  Administered 2018-05-07: 3 mL via RESPIRATORY_TRACT
  Filled 2018-05-07: qty 3

## 2018-05-07 MED ORDER — ONDANSETRON HCL 4 MG/2ML IJ SOLN: 4.0000 mg | Freq: Four times a day (QID) | INTRAMUSCULAR | Status: DC | PRN

## 2018-05-07 MED ORDER — SODIUM CHLORIDE 0.9 % IV SOLN: 250.0000 mL | INTRAVENOUS | Status: DC | PRN

## 2018-05-07 MED ORDER — POTASSIUM CHLORIDE CRYS ER 10 MEQ PO TBCR
10.0000 meq | EXTENDED_RELEASE_TABLET | Freq: Two times a day (BID) | ORAL | Status: DC
  Administered 2018-05-07 – 2018-05-10 (×6): 10 meq via ORAL
  Filled 2018-05-07 (×6): qty 1

## 2018-05-07 MED ORDER — GUAIFENESIN-DM 100-10 MG/5ML PO SYRP
15.0000 mL | ORAL_SOLUTION | ORAL | Status: DC | PRN
  Filled 2018-05-07: qty 15

## 2018-05-07 MED ORDER — SODIUM CHLORIDE 0.9% FLUSH
3.0000 mL | Freq: Two times a day (BID) | INTRAVENOUS | Status: DC
  Administered 2018-05-07 – 2018-05-09 (×5): 3 mL via INTRAVENOUS

## 2018-05-07 MED ORDER — IPRATROPIUM-ALBUTEROL 0.5-2.5 (3) MG/3ML IN SOLN: 3.0000 mL | RESPIRATORY_TRACT | Status: DC

## 2018-05-07 MED ORDER — METHYLPREDNISOLONE SODIUM SUCC 125 MG IJ SOLR
60.0000 mg | Freq: Four times a day (QID) | INTRAMUSCULAR | Status: DC
  Administered 2018-05-07 – 2018-05-10 (×10): 60 mg via INTRAVENOUS
  Filled 2018-05-07 (×10): qty 2

## 2018-05-07 MED ORDER — IPRATROPIUM-ALBUTEROL 0.5-2.5 (3) MG/3ML IN SOLN
3.0000 mL | Freq: Four times a day (QID) | RESPIRATORY_TRACT | Status: DC
  Administered 2018-05-07 – 2018-05-10 (×11): 3 mL via RESPIRATORY_TRACT
  Filled 2018-05-07 (×12): qty 3

## 2018-05-07 MED ORDER — ACETAMINOPHEN 650 MG RE SUPP: 650.0000 mg | Freq: Four times a day (QID) | RECTAL | Status: DC | PRN

## 2018-05-07 MED ORDER — BUDESONIDE 0.25 MG/2ML IN SUSP
0.2500 mg | Freq: Two times a day (BID) | RESPIRATORY_TRACT | Status: DC
  Administered 2018-05-07 – 2018-05-10 (×6): 0.25 mg via RESPIRATORY_TRACT
  Filled 2018-05-07 (×6): qty 2

## 2018-05-07 MED ORDER — MONTELUKAST SODIUM 10 MG PO TABS
10.0000 mg | ORAL_TABLET | Freq: Every day | ORAL | Status: DC
  Administered 2018-05-08 – 2018-05-10 (×3): 10 mg via ORAL
  Filled 2018-05-07 (×3): qty 1

## 2018-05-07 MED ORDER — ACETAMINOPHEN 325 MG PO TABS: 650.0000 mg | ORAL_TABLET | Freq: Four times a day (QID) | ORAL | Status: DC | PRN

## 2018-05-07 MED ORDER — FUROSEMIDE 40 MG PO TABS
40.0000 mg | ORAL_TABLET | Freq: Every day | ORAL | Status: DC
  Administered 2018-05-08 – 2018-05-10 (×3): 40 mg via ORAL
  Filled 2018-05-07 (×3): qty 1

## 2018-05-07 MED ORDER — ALBUTEROL SULFATE (2.5 MG/3ML) 0.083% IN NEBU: 2.5000 mg | INHALATION_SOLUTION | Freq: Four times a day (QID) | RESPIRATORY_TRACT | Status: DC

## 2018-05-07 MED ORDER — SODIUM CHLORIDE 0.9% FLUSH
3.0000 mL | INTRAVENOUS | Status: DC | PRN
  Administered 2018-05-07: 3 mL via INTRAVENOUS
  Filled 2018-05-07: qty 3

## 2018-05-07 MED ORDER — ENOXAPARIN SODIUM 40 MG/0.4ML ~~LOC~~ SOLN
40.0000 mg | SUBCUTANEOUS | Status: DC
  Administered 2018-05-07 – 2018-05-09 (×3): 40 mg via SUBCUTANEOUS
  Filled 2018-05-07 (×3): qty 0.4

## 2018-05-07 MED ORDER — ONDANSETRON HCL 4 MG PO TABS: 4.0000 mg | ORAL_TABLET | Freq: Four times a day (QID) | ORAL | Status: DC | PRN

## 2018-05-07 MED ORDER — IPRATROPIUM BROMIDE 0.02 % IN SOLN: 0.5000 mg | Freq: Four times a day (QID) | RESPIRATORY_TRACT | Status: DC

## 2018-05-07 MED ORDER — GUAIFENESIN ER 600 MG PO TB12
600.0000 mg | ORAL_TABLET | Freq: Two times a day (BID) | ORAL | Status: DC
  Administered 2018-05-07 – 2018-05-10 (×6): 600 mg via ORAL
  Filled 2018-05-07 (×6): qty 1

## 2018-05-07 MED ORDER — METHYLPREDNISOLONE SODIUM SUCC 125 MG IJ SOLR
80.0000 mg | Freq: Once | INTRAMUSCULAR | Status: AC
  Administered 2018-05-07: 80 mg via INTRAVENOUS
  Filled 2018-05-07: qty 2

## 2018-05-07 MED ORDER — ORAL CARE MOUTH RINSE
15.0000 mL | Freq: Two times a day (BID) | OROMUCOSAL | Status: DC
  Administered 2018-05-08 – 2018-05-09 (×3): 15 mL via OROMUCOSAL

## 2018-05-07 NOTE — ED Notes (Signed)
FIRST NURSE NOTE: [facility] (100.3). Oxygen saturation in the 80s at home. 2L placed on pt for transport to ED.

## 2018-05-07 NOTE — Progress Notes (Signed)
Patient resting in bed, eating sandwich tray. O2 3L acute. No needs at this time. Call bell in reach. Continue to monitor.

## 2018-05-07 NOTE — ED Notes (Signed)
Report was called and given to the floor.   

## 2018-05-07 NOTE — ED Notes (Signed)
Attempted to wean patient off oxygen after breathing treatment. Remains in upper 80's on RA. Placed back on 2 L [place]. Will continue to monitor.

## 2018-05-07 NOTE — ED Triage Notes (Signed)
Pt to ED from home c/o SOB that started Sunday, used albuterol yesterday with relief.  States hx of COPD and asthma.  Denies home O2 use but is [facility] [place] [facility].  Pt speaking in complete and coherent sentences, states productive clear cough, chest rise even and unlabored.

## 2018-05-07 NOTE — H&P (Signed)
[facility] - [place] [facility]   PATIENT NAME: Miguel Howell    MR#:  [id number]  DATE OF BIRTH:  1956/05/30  DATE OF ADMISSION:  05/07/2018  PRIMARY CARE PHYSICIAN: Marguarite Arbour, MD   REQUESTING/REFERRING PHYSICIAN: Jonelle Sports MD  CHIEF COMPLAINT:   Chief Complaint  Patient presents with  . Shortness of Breath    HISTORY OF PRESENT ILLNESS: Miguel Howell  is a 62 y.o. male with a known history of COPD, hypertension who is presenting with shortness of breath patient states that he has been having progressive shortness of breath since Sunday.  He also has had cough that was productive now decreased.  Patient states that he stayed at someone's house with few dogs and thinks that that may have triggered his symptoms.  He also complains of wheezing.  Positive fever no chills.  So reports that he did have diarrhea.  PAST MEDICAL HISTORY:   Past Medical History:  Diagnosis Date  . Asthma   . COPD (chronic obstructive pulmonary disease) (HCC)   . Hypertension     PAST SURGICAL HISTORY:  Past Surgical History:  Procedure Laterality Date  . HERNIA REPAIR      SOCIAL HISTORY:  Social History   Tobacco Use  . Smoking status: Current Every Day Smoker    Packs/day: 1.50    Years: 40.00    Pack years: 60.00    Types: Cigarettes    Last attempt to quit: 07/27/2015    Years since quitting: 2.7  . Smokeless tobacco: Former Engineer, water Use Topics  . Alcohol use: Yes    Alcohol/week: 2.0 standard drinks    Types: 2 Glasses of wine per week    Comment: once a week    FAMILY HISTORY:  Family History  Problem Relation Age of Onset  . Diabetes Mother   . Diabetes Father   . COPD Father   . Heart attack Father     DRUG ALLERGIES: No Known Allergies  REVIEW OF SYSTEMS:   CONSTITUTIONAL: No fever, fatigue or weakness.  EYES: No blurred or double vision.  EARS, NOSE, AND THROAT: No tinnitus or ear pain.  RESPIRATORY: No cough, positive shortness of  breath, positive wheezing or hemoptysis.  CARDIOVASCULAR: No chest pain, orthopnea, edema.  GASTROINTESTINAL: No nausea, vomiting, diarrhea or abdominal pain.  GENITOURINARY: No dysuria, hematuria.  ENDOCRINE: No polyuria, nocturia,  HEMATOLOGY: No anemia, easy bruising or bleeding SKIN: No rash or lesion. MUSCULOSKELETAL: No joint pain or arthritis.   NEUROLOGIC: No tingling, numbness, weakness.  PSYCHIATRY: No anxiety or depression.   MEDICATIONS AT HOME:  Prior to Admission medications   Medication Sig Start Date End Date Taking? Authorizing Provider  albuterol (PROVENTIL) (2.5 MG/3ML) 0.083% nebulizer solution Take 3 mLs (2.5 mg total) by nebulization every 4 (four) hours as needed for wheezing or shortness of breath. 06/12/16   [name], MD  carvedilol (COREG) 6.25 MG tablet Take 6.25 mg by mouth 2 (two) times daily. 02/17/16   [provider]  doxycycline (VIBRAMYCIN) 100 MG capsule Take 1 capsule (100 mg total) by mouth 2 (two) times daily. 06/12/16   [name], MD  furosemide (LASIX) 40 MG tablet Take 1 tablet (40 mg total) by mouth daily. 06/12/16   [name], MD  guaiFENesin-dextromethorphan (ROBITUSSIN DM) 100-10 MG/5ML syrup Take 10 mLs by mouth every 6 (six) hours as needed for cough. 06/07/16   Gouru, Deanna Artis, MD  LORazepam (ATIVAN) 1 MG tablet Take 1 mg by  mouth at bedtime as needed for sleep. 02/17/16   [provider]  menthol-cetylpyridinium (CEPACOL) 3 MG lozenge Take 1 lozenge (3 mg total) by mouth as needed for sore throat. 06/07/16   Gouru, Deanna ArtisAruna, MD  potassium chloride (K-DUR,KLOR-CON) 10 MEQ tablet Take 1 tablet (10 mEq total) by mouth 2 (two) times daily. 06/12/16   Katharina CaperVaickute, Rima, MD  predniSONE (STERAPRED UNI-PAK 21 TAB) 10 MG (21) TBPK tablet Take 1 tablet (10 mg total) by mouth daily. Please take 6 pills in the morning on the day 1, then taper by one pill daily until finished, thank you 06/12/16   Katharina CaperVaickute, Rima, MD  PROAIR HFA 108 [phone](90 Base) MCG/ACT  inhaler Inhale 2 puffs into the lungs every 4 (four) hours as needed for shortness of breath. 03/05/16   [provider]  ranitidine (ZANTAC) 150 MG tablet Take 150 mg by mouth daily.    [provider]  senna (SENOKOT) 8.6 MG TABS tablet Take 1 tablet (8.6 mg total) by mouth daily as needed for mild constipation. 06/07/16   Gouru, Deanna ArtisAruna, MD  SPIRIVA HANDIHALER 18 MCG inhalation capsule Place 1 capsule into inhaler and inhale daily. 03/09/16   [provider]  SYMBICORT 160-4.5 MCG/ACT inhaler Inhale 2 puffs into the lungs 2 (two) times daily. 02/07/16   [provider]      PHYSICAL EXAMINATION:   VITAL SIGNS: Blood pressure 132/61, pulse 81, temperature 98.5 F (36.9 C), temperature source Oral, resp. rate 17, height 5\' 9"  (1.753 m), weight 108.9 kg, SpO2 (!) 86 %.  GENERAL:  62 y.o.-year-old patient lying in the bed with no acute distress.  EYES: Pupils equal, round, reactive to light and accommodation. No scleral icterus. Extraocular muscles intact.  HEENT: Head atraumatic, normocephalic. Oropharynx and nasopharynx clear.  NECK:  Supple, no jugular venous distention. No thyroid enlargement, no tenderness.  LUNGS: Bilateral wheezing throughout both lungs no accessory muscle usage CARDIOVASCULAR: S1, S2 normal. No murmurs, rubs, or gallops.  ABDOMEN: Soft, nontender, nondistended. Bowel sounds present. No organomegaly or mass.  EXTREMITIES: No pedal edema, cyanosis, or clubbing.  NEUROLOGIC: Cranial nerves II through XII are intact. Muscle strength 5/5 in all extremities. Sensation intact. Gait not checked.  PSYCHIATRIC: The patient is alert and oriented x 3.  SKIN: No obvious rash, lesion, or ulcer.   LABORATORY PANEL:   CBC Recent Labs  Lab 05/07/18 1324  WBC 7.6  HGB 17.1*  HCT 51.4  PLT 184  MCV 104.9*  MCH 34.9*  MCHC 33.3  RDW 13.7    ------------------------------------------------------------------------------------------------------------------  Chemistries  Recent Labs  Lab 05/07/18 1324  NA 135  K 4.3  CL 98  CO2 31  GLUCOSE 109*  BUN 15  CREATININE 1.10  CALCIUM 9.1   ------------------------------------------------------------------------------------------------------------------ estimated creatinine clearance is 86.9 mL/min (by C-G formula based on SCr of 1.1 mg/dL). ------------------------------------------------------------------------------------------------------------------ No results for input(s): TSH, T4TOTAL, T3FREE, THYROIDAB in the last 72 hours.  Invalid input(s): FREET3   Coagulation profile No results for input(s): INR, PROTIME in the last 168 hours. ------------------------------------------------------------------------------------------------------------------- No results for input(s): DDIMER in the last 72 hours. -------------------------------------------------------------------------------------------------------------------  Cardiac Enzymes Recent Labs  Lab 05/07/18 1324  TROPONINI <0.03   ------------------------------------------------------------------------------------------------------------------ Invalid input(s): POCBNP  ---------------------------------------------------------------------------------------------------------------  Urinalysis    Component Value Date/Time   COLORURINE YELLOW (A) 06/08/2016 1438   APPEARANCEUR CLEAR (A) 06/08/2016 1438   LABSPEC 1.012 06/08/2016 1438   PHURINE 6.0 06/08/2016 1438   GLUCOSEU NEGATIVE 06/08/2016 1438   HGBUR SMALL (A) 06/08/2016  1438   BILIRUBINUR NEGATIVE 06/08/2016 1438   KETONESUR NEGATIVE 06/08/2016 1438   PROTEINUR NEGATIVE 06/08/2016 1438   NITRITE NEGATIVE 06/08/2016 1438   LEUKOCYTESUR NEGATIVE 06/08/2016 1438     RADIOLOGY: Dg Chest 2 View  Result Date: 05/07/2018 CLINICAL DATA:  Acute  shortness of breath. EXAM: CHEST - 2 VIEW COMPARISON:  06/08/2016 and prior radiographs FINDINGS: Cardiomegaly again noted. There is no evidence of focal airspace disease, pulmonary edema, suspicious pulmonary nodule/mass, pleural effusion, or pneumothorax. No acute bony abnormalities are identified. IMPRESSION: Cardiomegaly without evidence of acute cardiopulmonary disease. Electronically Signed   By: Harmon Pier M.D.   On: 05/07/2018 13:51    EKG: Orders placed or performed during the hospital encounter of 05/07/18  . ED EKG  . ED EKG    IMPRESSION AND PLAN: Patient is 62 year old presenting with shortness of breath  1.  Acute hypoxic respiratory failure due to acute COPD exasperation  2.  Acute on chronic COPD exasperation we will treat with nebulizer and steroids I will also place patient on Pulmicort I will check flu test Check sputum Place patient on oral doxycycline  3.  Hypertension continue Coreg  4.  Nicotine abuse smoking cessation provided 4 minutes spent strongly recommend patient stop smoking, patient will be started on a nicotine patch  All the records are reviewed and case discussed with ED provider. Management plans discussed with the patient, family and they are in agreement.  CODE STATUS: Code Status History    Date Active Date Inactive Code Status Order ID Comments User Context   06/08/2016 1722 06/12/2016 1753 Full Code [id number]  Katha Hamming, MD ED   06/04/2016 2248 06/07/2016 1855 Full Code [id number]  Houston Siren, MD Inpatient   03/25/2016 0246 03/26/2016 1518 Full Code [id number]  Oralia Manis, MD Inpatient       TOTAL TIME TAKING CARE OF THIS PATIENT: .    Auburn Bilberry M.D on 05/07/2018 at 6:11 PM  Between 7am to 6pm - Pager - [phone]  After 6pm go to [email or web address] - password [email or web address]  [facility] Office  [phone]  CC: Primary care physician; Marguarite Arbour, MD

## 2018-05-07 NOTE — ED Notes (Signed)
Pt is resting in bed. Respirations even/unlabored. Nadn. Pt is waiting on hospital bed.

## 2018-05-07 NOTE — ED Provider Notes (Signed)
[facility] Emergency Department Provider Note   ____________________________________________   First MD Initiated Contact with Patient 05/07/18 1522     (approximate)  I have reviewed the triage vital signs and the nursing notes.   HISTORY  Chief Complaint Shortness of Breath    HPI Miguel Howell is a 62 y.o. male patient complains of wheezing for 2 to 3 days.  He is used his inhalers without any help.  Is also been having diarrhea frequently for the last day or so.  Now that he is in the emergency room the diarrhea has slowed down and wheezing has improved.  Patient had a temperature of 100.3.  He requires 2 L of oxygen stay above 90%.  Past Medical History:  Diagnosis Date  . Asthma   . COPD (chronic obstructive pulmonary disease) (HCC)   . Hypertension     Patient Active Problem List   Diagnosis Date Noted  . Acute on chronic diastolic CHF (congestive heart failure) (HCC) 06/12/2016  . Pneumonia 06/12/2016  . Hyperkalemia 06/12/2016  . Encephalopathy, metabolic 06/12/2016  . Wide-complex tachycardia (HCC) 06/12/2016  . Acute on chronic respiratory failure with hypoxia and hypercapnia (HCC) 06/08/2016  . COPD exacerbation (HCC) 06/04/2016  . Diarrhea 03/25/2016  . Dehydration 03/25/2016  . Hypokalemia 03/25/2016  . HTN (hypertension) 03/25/2016  . COPD (chronic obstructive pulmonary disease) (HCC) 03/25/2016    Past Surgical History:  Procedure Laterality Date  . HERNIA REPAIR      Prior to Admission medications   Medication Sig Start Date End Date Taking? Authorizing Provider  albuterol (PROVENTIL) (2.5 MG/3ML) 0.083% nebulizer solution Take 3 mLs (2.5 mg total) by nebulization every 4 (four) hours as needed for wheezing or shortness of breath. 06/12/16   [name], MD  carvedilol (COREG) 6.25 MG tablet Take 6.25 mg by mouth 2 (two) times daily. 02/17/16   [provider]  doxycycline (VIBRAMYCIN) 100 MG capsule Take 1  capsule (100 mg total) by mouth 2 (two) times daily. 06/12/16   [name], MD  furosemide (LASIX) 40 MG tablet Take 1 tablet (40 mg total) by mouth daily. 06/12/16   [name], MD  guaiFENesin-dextromethorphan (ROBITUSSIN DM) 100-10 MG/5ML syrup Take 10 mLs by mouth every 6 (six) hours as needed for cough. 06/07/16   Gouru, Deanna Artis, MD  LORazepam (ATIVAN) 1 MG tablet Take 1 mg by mouth at bedtime as needed for sleep. 02/17/16   [provider]  menthol-cetylpyridinium (CEPACOL) 3 MG lozenge Take 1 lozenge (3 mg total) by mouth as needed for sore throat. 06/07/16   Gouru, Deanna Artis, MD  potassium chloride (K-DUR,KLOR-CON) 10 MEQ tablet Take 1 tablet (10 mEq total) by mouth 2 (two) times daily. 06/12/16   [name], MD  predniSONE (STERAPRED UNI-PAK 21 TAB) 10 MG (21) TBPK tablet Take 1 tablet (10 mg total) by mouth daily. Please take 6 pills in the morning on the day 1, then taper by one pill daily until finished, thank you 06/12/16   [name], MD  PROAIR HFA 108 [phone] Base) MCG/ACT inhaler Inhale 2 puffs into the lungs every 4 (four) hours as needed for shortness of breath. 03/05/16   [provider]  ranitidine (ZANTAC) 150 MG tablet Take 150 mg by mouth daily.    [provider]  senna (SENOKOT) 8.6 MG TABS tablet Take 1 tablet (8.6 mg total) by mouth daily as needed for mild constipation. 06/07/16   Ramonita Lab, MD  SPIRIVA HANDIHALER 18 MCG inhalation  capsule Place 1 capsule into inhaler and inhale daily. 03/09/16   [provider]  SYMBICORT 160-4.5 MCG/ACT inhaler Inhale 2 puffs into the lungs 2 (two) times daily. 02/07/16   [provider]    Allergies Patient has no known allergies.  Family History  Problem Relation Age of Onset  . Diabetes Mother   . Diabetes Father   . COPD Father   . Heart attack Father     Social History Social History   Tobacco Use  . Smoking status: Former Smoker    Packs/day: 1.50    Years: 40.00    Pack years:  60.00    Types: Cigarettes    Last attempt to quit: 07/27/2015    Years since quitting: 2.7  . Smokeless tobacco: Former Engineer, waterUser  Substance Use Topics  . Alcohol use: Yes    Alcohol/week: 2.0 standard drinks    Types: 2 Glasses of wine per week    Comment: once a week  . Drug use: No    Review of Systems  Constitutional: No fever/chills Eyes: No visual changes. ENT: No sore throat. Cardiovascular: Denies chest pain. Respiratory:shortness of breath. Gastrointestinal: No abdominal pain.  No nausea, no vomiting.   diarrhea.  No constipation. Genitourinary: Negative for dysuria. Musculoskeletal: Negative for back pain. Skin: Negative for rash. Neurological: Negative for headaches, focal weakness o  ____________________________________________   PHYSICAL EXAM:  VITAL SIGNS: ED Triage Vitals  Enc Vitals Group     BP 05/07/18 1314 135/64     Pulse Rate 05/07/18 1314 86     Resp 05/07/18 1544 18     Temp 05/07/18 1314 98.5 F (36.9 C)     Temp Source 05/07/18 1314 Oral     SpO2 05/07/18 1314 93 %     Weight 05/07/18 1315 240 lb (108.9 kg)     Height 05/07/18 1315 5\' 9"  (1.753 m)     Head Circumference --      Peak Flow --      Pain Score 05/07/18 1321 0     Pain Loc --      Pain Edu? --      Excl. in GC? --     Constitutional: Alert and oriented. Well appearing and in no acute distress. Eyes: Conjunctivae are normal. . Head: Atraumatic. Nose: No congestion/rhinnorhea. Mouth/Throat: Mucous membranes are moist.  Oropharynx non-erythematous. Neck: No stridor.  Cardiovascular: Normal rate, regular rhythm. Grossly normal heart sounds.  Good peripheral circulation. Respiratory: Normal respiratory effort.  No retractions. Lungs CTAB. Gastrointestinal: Soft and nontender. No distention. No abdominal bruits. No CVA tenderness. Musculoskeletal: No lower extremity tenderness nor edema.   Neurologic:  Normal speech and language. No gross focal neurologic deficits are appreciated.   Skin:  Skin is warm, dry and intact. No rash noted. Psychiatric: Mood and affect are normal. Speech and behavior are normal.  ____________________________________________   LABS (all labs ordered are listed, but only abnormal results are displayed)  Labs Reviewed  BASIC METABOLIC PANEL - Abnormal; Notable for the following components:      Result Value   Glucose, Bld 109 (*)    All other components within normal limits  CBC - Abnormal; Notable for the following components:   Hemoglobin 17.1 (*)    MCV 104.9 (*)    MCH 34.9 (*)    All other components within normal limits  TROPONIN I   ____________________________________________  EKG  EKG read interpreted by me shows normal sinus rhythm rate of 9 ST-T  wave changes ____________________________________________  RADIOLOGY  ED MD interpretation chest x-ray read by radiology reviewed by me shows only cardiomegaly without any acute changes  Official radiology report(s): Dg Chest 2 View  Result Date: 05/07/2018 CLINICAL DATA:  Acute shortness of breath. EXAM: CHEST - 2 VIEW COMPARISON:  06/08/2016 and prior radiographs FINDINGS: Cardiomegaly again noted. There is no evidence of focal airspace disease, pulmonary edema, suspicious pulmonary nodule/mass, pleural effusion, or pneumothorax. No acute bony abnormalities are identified. IMPRESSION: Cardiomegaly without evidence of acute cardiopulmonary disease. Electronically Signed   By: Harmon Pier M.D.   On: 05/07/2018 13:51    ____________________________________________   PROCEDURES  Procedure(s) performed:   Procedures  Critical Care performed: Critical care time half an hour this includes reexamining the patient twice and following his oxygen levels  ____________________________________________   INITIAL IMPRESSION / ASSESSMENT AND PLAN / ED COURSE After the first neb patient is still wheezing a lot.  After the third neb in the Solu-Medrol patient's improved but still  wheezing he feels better but is opposed to sats off of oxygen still fall down to 85% which is not normal for him.  We will discuss the patient with the hospitalist and plan on admitting him for COPD exacerbation.  He is not tachycardic his troponin is negative and chest x-ray is clear          ____________________________________________   FINAL CLINICAL IMPRESSION(S) / ED DIAGNOSES  Final diagnoses:  COPD exacerbation (HCC)  Hypoxia     ED Discharge Orders    None       Note:  This document was prepared using Dragon voice recognition software and may include unintentional dictation errors.    Arnaldo Natal, MD 05/07/18 [phone]

## 2018-05-07 NOTE — ED Notes (Signed)
Pt given graham crackers, peanut butter, and ginger ale. Ok per Dr. Darnelle CatalanMalinda.

## 2018-05-08 LAB — CBC
HCT: 50.3 % (ref 39.0–52.0)
Hemoglobin: 16.7 g/dL (ref 13.0–17.0)
MCH: 34.4 pg — AB (ref 26.0–34.0)
MCHC: 33.2 g/dL (ref 30.0–36.0)
MCV: 103.5 fL — ABNORMAL HIGH (ref 80.0–100.0)
Platelets: 155 10*3/uL (ref 150–400)
RBC: 4.86 MIL/uL (ref 4.22–5.81)
RDW: 13.2 % (ref 11.5–15.5)
WBC: 4 10*3/uL (ref 4.0–10.5)
nRBC: 0 % (ref 0.0–0.2)

## 2018-05-08 LAB — BASIC METABOLIC PANEL
Anion gap: 4 — ABNORMAL LOW (ref 5–15)
BUN: 16 mg/dL (ref 6–20)
CO2: 33 mmol/L — ABNORMAL HIGH (ref 22–32)
CREATININE: 0.96 mg/dL (ref 0.61–1.24)
Calcium: 9.1 mg/dL (ref 8.9–10.3)
Chloride: 101 mmol/L (ref 98–111)
GFR calc Af Amer: >60 mL/min (ref >60)
GFR calc non Af Amer: >60 mL/min (ref >60)
Glucose, Bld: 191 mg/dL — ABNORMAL HIGH (ref 70–99)
Potassium: 4.9 mmol/L (ref 3.5–5.1)
Sodium: 138 mmol/L (ref 135–145)

## 2018-05-08 NOTE — Progress Notes (Signed)
[employer] - [place] at [facility]                                                                                                                                                                                  Patient Demographics   Miguel Howell, is a 62 y.o. male, DOB - 03-Oct-1955, ZOX:[id number]  Admit date - 05/07/2018   Admitting Physician Auburn Bilberry, MD  Outpatient Primary MD for the patient is Marguarite Arbour, MD   LOS - 1  Subjective: Patient states that his breathing is improved Has cough   Review of Systems:   CONSTITUTIONAL: No documented fever. No fatigue, weakness. No weight gain, no weight loss.  EYES: No blurry or double vision.  ENT: No tinnitus. No postnasal drip. No redness of the oropharynx.  RESPIRATORY: Positive cough, no wheeze, no hemoptysis.  Positive dyspnea.  CARDIOVASCULAR: No chest pain. No orthopnea. No palpitations. No syncope.  GASTROINTESTINAL: No nausea, no vomiting or diarrhea. No abdominal pain. No melena or hematochezia.  GENITOURINARY: No dysuria or hematuria.  ENDOCRINE: No polyuria or nocturia. No heat or cold intolerance.  HEMATOLOGY: No anemia. No bruising. No bleeding.  INTEGUMENTARY: No rashes. No lesions.  MUSCULOSKELETAL: No arthritis. No swelling. No gout.  NEUROLOGIC: No numbness, tingling, or ataxia. No seizure-type activity.  PSYCHIATRIC: No anxiety. No insomnia. No ADD.    Vitals:   Vitals:   05/08/18 0750 05/08/18 0903 05/08/18 0925 05/08/18 1348  BP: 140/73  (!) 156/68   Pulse: 66 68 77 73  Resp: 18 18  18   Temp: (!) 97.5 F (36.4 C)     TempSrc: Oral     SpO2: 92% 91%  93%  Weight:      Height:        Wt Readings from Last 3 Encounters:  05/07/18 108.9 kg  06/12/16 98.4 kg  06/04/16 100.9 kg     Intake/Output Summary (Last 24 hours) at 05/08/2018 1426 Last data filed at 05/08/2018 0900 Gross per 24 hour  Intake 240 ml  Output -  Net 240 ml    Physical Exam:   GENERAL:  Pleasant-appearing in no apparent distress.  HEAD, EYES, EARS, NOSE AND THROAT: Atraumatic, normocephalic. Extraocular muscles are intact. Pupils equal and reactive to light. Sclerae anicteric. No conjunctival injection. No oro-pharyngeal erythema.  NECK: Supple. There is no jugular venous distention. No bruits, no lymphadenopathy, no thyromegaly.  HEART: Regular rate and rhythm,. No murmurs, no rubs, no clicks.  LUNGS: Bilateral wheezing throughout both lung no accessory muscle usage ABDOMEN: Soft, flat, nontender, nondistended. Has good bowel sounds. No hepatosplenomegaly appreciated.  EXTREMITIES: No evidence of any cyanosis, clubbing, or peripheral edema.  +  2 pedal and radial pulses bilaterally.  NEUROLOGIC: The patient is alert, awake, and oriented x3 with no focal motor or sensory deficits appreciated bilaterally.  SKIN: Moist and warm with no rashes appreciated.  Psych: Not anxious, depressed LN: No inguinal LN enlargement    Antibiotics   Anti-infectives (From admission, onward)   Start     Dose/Rate Route Frequency Ordered Stop   05/07/18 2200  doxycycline (VIBRA-TABS) tablet 100 mg     100 mg Oral Every 12 hours 05/07/18 1809        Medications   Scheduled Meds: . aspirin EC  81 mg Oral Daily  . budesonide (PULMICORT) nebulizer solution  0.25 mg Nebulization BID  . carvedilol  3.125 mg Oral BID  . doxycycline  100 mg Oral Q12H  . enoxaparin (LOVENOX) injection  40 mg Subcutaneous Q24H  . furosemide  40 mg Oral Daily  . guaiFENesin  600 mg Oral BID  . ipratropium-albuterol  3 mL Nebulization Q6H  . mouth rinse  15 mL Mouth Rinse BID  . methylPREDNISolone (SOLU-MEDROL) injection  60 mg Intravenous Q6H  . montelukast  10 mg Oral Daily  . nicotine  21 mg Transdermal Daily  . potassium chloride  10 mEq Oral BID  . sodium chloride flush  3 mL Intravenous Q12H  . tiotropium  18 mcg Inhalation q morning - 10a   Continuous Infusions: . sodium chloride     PRN Meds:.sodium  chloride, acetaminophen **OR** acetaminophen, diazepam, guaiFENesin-dextromethorphan, ondansetron **OR** ondansetron (ZOFRAN) IV, sodium chloride flush   Data Review:   Micro Results No results found for this or any previous visit (from the past 240 hour(s)).  Radiology Reports Dg Chest 2 View  Result Date: 05/07/2018 CLINICAL DATA:  Acute shortness of breath. EXAM: CHEST - 2 VIEW COMPARISON:  06/08/2016 and prior radiographs FINDINGS: Cardiomegaly again noted. There is no evidence of focal airspace disease, pulmonary edema, suspicious pulmonary nodule/mass, pleural effusion, or pneumothorax. No acute bony abnormalities are identified. IMPRESSION: Cardiomegaly without evidence of acute cardiopulmonary disease. Electronically Signed   By: Harmon PierJeffrey  Hu M.D.   On: 05/07/2018 13:51     CBC Recent Labs  Lab 05/07/18 1324 05/08/18 0548  WBC 7.6 4.0  HGB 17.1* 16.7  HCT 51.4 50.3  PLT 184 155  MCV 104.9* 103.5*  MCH 34.9* 34.4*  MCHC 33.3 33.2  RDW 13.7 13.2    Chemistries  Recent Labs  Lab 05/07/18 1324 05/08/18 0548  NA 135 138  K 4.3 4.9  CL 98 101  CO2 31 33*  GLUCOSE 109* 191*  BUN 15 16  CREATININE 1.10 0.96  CALCIUM 9.1 9.1   ------------------------------------------------------------------------------------------------------------------ estimated creatinine clearance is 99.5 mL/min (by C-G formula based on SCr of 0.96 mg/dL). ------------------------------------------------------------------------------------------------------------------ No results for input(s): HGBA1C in the last 72 hours. ------------------------------------------------------------------------------------------------------------------ No results for input(s): CHOL, HDL, LDLCALC, TRIG, CHOLHDL, LDLDIRECT in the last 72 hours. ------------------------------------------------------------------------------------------------------------------ No results for input(s): TSH, T4TOTAL, T3FREE, THYROIDAB in  the last 72 hours.  Invalid input(s): FREET3 ------------------------------------------------------------------------------------------------------------------ No results for input(s): VITAMINB12, FOLATE, FERRITIN, TIBC, IRON, RETICCTPCT in the last 72 hours.  Coagulation profile No results for input(s): INR, PROTIME in the last 168 hours.  No results for input(s): DDIMER in the last 72 hours.  Cardiac Enzymes Recent Labs  Lab 05/07/18 1324  TROPONINI <0.03   ------------------------------------------------------------------------------------------------------------------ Invalid input(s): POCBNP    Assessment & Plan  Patient is 62 year old presenting with shortness of breath  1.  Acute hypoxic respiratory failure due to  acute COPD exasperation: Some improvement Wean oxygen as tolerated  2.  Acute on chronic COPD exasperation continue nebulizer and steroids Continue Pulmicort Negative flu test Check sputum pending Continue on oral doxycycline  3.  Hypertension continue Coreg  4.  Nicotine abuse smoking cessation provided        Code Status Orders  (From admission, onward)         Start     Ordered   05/07/18 2032  Full code  Continuous     05/07/18 2031        Code Status History    Date Active Date Inactive Code Status Order ID Comments User Context   06/08/2016 1722 06/12/2016 1753 Full Code [id number]  Katha Hamming, MD ED   06/04/2016 2248 06/07/2016 1855 Full Code [id number]  Houston Siren, MD Inpatient   03/25/2016 0246 03/26/2016 1518 Full Code [id number]  Oralia Manis, MD Inpatient           Consults none  DVT Prophylaxis  Lovenox  Lab Results  Component Value Date   PLT 155 05/08/2018     Time Spent in minutes   35 minutes greater than 50% of time spent in care coordination and counseling patient regarding the condition and plan of care.   Auburn Bilberry M.D on 05/08/2018 at 2:26 PM  Between 7am to 6pm - Pager -  [phone]  After 6pm go to [email or web address] - Social research officer, government  [employer]   Office  [phone]

## 2018-05-09 ENCOUNTER — Inpatient Hospital Stay: Payer: BLUE CROSS/BLUE SHIELD

## 2018-05-09 ENCOUNTER — Encounter: Payer: Self-pay | Admitting: Radiology

## 2018-05-09 LAB — HIV ANTIBODY (ROUTINE TESTING W REFLEX): HIV Screen 4th Generation wRfx: NONREACTIVE

## 2018-05-09 MED ORDER — HYDRALAZINE HCL 50 MG PO TABS
50.0000 mg | ORAL_TABLET | Freq: Two times a day (BID) | ORAL | Status: DC
  Administered 2018-05-09 – 2018-05-10 (×2): 50 mg via ORAL
  Filled 2018-05-09 (×2): qty 1

## 2018-05-09 MED ORDER — IOHEXOL 350 MG/ML SOLN
75.0000 mL | Freq: Once | INTRAVENOUS | Status: AC | PRN
  Administered 2018-05-09: 75 mL via INTRAVENOUS

## 2018-05-09 NOTE — Progress Notes (Signed)
[employer] - [place] at [place] [facility]                                                                                                                                                                                  Patient Demographics   Miguel Howell, is a 62 y.o. male, DOB - [birth year], ZOX:[id number]  Admit date - 05/07/2018   Admitting Physician Auburn Bilberry, MD  Outpatient Primary MD for the patient is Marguarite Arbour, MD   LOS - 2  Subjective: Still requiring 2 to 3 L of oxygen denies any chest pain still short of breath  Review of Systems:   CONSTITUTIONAL: No documented fever. No fatigue, weakness. No weight gain, no weight loss.  EYES: No blurry or double vision.  ENT: No tinnitus. No postnasal drip. No redness of the oropharynx.  RESPIRATORY: Positive cough, no wheeze, no hemoptysis.  Positive dyspnea.  CARDIOVASCULAR: No chest pain. No orthopnea. No palpitations. No syncope.  GASTROINTESTINAL: No nausea, no vomiting or diarrhea. No abdominal pain. No melena or hematochezia.  GENITOURINARY: No dysuria or hematuria.  ENDOCRINE: No polyuria or nocturia. No heat or cold intolerance.  HEMATOLOGY: No anemia. No bruising. No bleeding.  INTEGUMENTARY: No rashes. No lesions.  MUSCULOSKELETAL: No arthritis. No swelling. No gout.  NEUROLOGIC: No numbness, tingling, or ataxia. No seizure-type activity.  PSYCHIATRIC: No anxiety. No insomnia. No ADD.    Vitals:   Vitals:   05/09/18 0343 05/09/18 0745 05/09/18 0834 05/09/18 1100  BP: (!) 158/83 (!) 166/78    Pulse: 81 71    Resp: 16 18    Temp: 97.8 F (36.6 C) 98.1 F (36.7 C)    TempSrc: Oral Oral    SpO2: 96% 95% (!) 85% 90%  Weight:      Height:        Wt Readings from Last 3 Encounters:  05/07/18 108.9 kg  06/12/16 98.4 kg  06/04/16 100.9 kg     Intake/Output Summary (Last 24 hours) at 05/09/2018 1222 Last data filed at 05/09/2018 0900 Gross per 24 hour  Intake 600 ml  Output -  Net 600 ml     Physical Exam:   GENERAL: Pleasant-appearing in no apparent distress.  HEAD, EYES, EARS, NOSE AND THROAT: Atraumatic, normocephalic. Extraocular muscles are intact. Pupils equal and reactive to light. Sclerae anicteric. No conjunctival injection. No oro-pharyngeal erythema.  NECK: Supple. There is no jugular venous distention. No bruits, no lymphadenopathy, no thyromegaly.  HEART: Regular rate and rhythm,. No murmurs, no rubs, no clicks.  LUNGS: Bilateral wheezing throughout both lung no accessory muscle usage ABDOMEN: Soft, flat, nontender, nondistended. Has good bowel sounds. No hepatosplenomegaly appreciated.  EXTREMITIES:  No evidence of any cyanosis, clubbing, or peripheral edema.  +2 pedal and radial pulses bilaterally.  NEUROLOGIC: The patient is alert, awake, and oriented x3 with no focal motor or sensory deficits appreciated bilaterally.  SKIN: Moist and warm with no rashes appreciated.  Psych: Not anxious, depressed LN: No inguinal LN enlargement    Antibiotics   Anti-infectives (From admission, onward)   Start     Dose/Rate Route Frequency Ordered Stop   05/07/18 2200  doxycycline (VIBRA-TABS) tablet 100 mg     100 mg Oral Every 12 hours 05/07/18 1809        Medications   Scheduled Meds: . aspirin EC  81 mg Oral Daily  . budesonide (PULMICORT) nebulizer solution  0.25 mg Nebulization BID  . carvedilol  3.125 mg Oral BID  . doxycycline  100 mg Oral Q12H  . enoxaparin (LOVENOX) injection  40 mg Subcutaneous Q24H  . furosemide  40 mg Oral Daily  . guaiFENesin  600 mg Oral BID  . ipratropium-albuterol  3 mL Nebulization Q6H  . mouth rinse  15 mL Mouth Rinse BID  . methylPREDNISolone (SOLU-MEDROL) injection  60 mg Intravenous Q6H  . montelukast  10 mg Oral Daily  . nicotine  21 mg Transdermal Daily  . potassium chloride  10 mEq Oral BID  . sodium chloride flush  3 mL Intravenous Q12H  . tiotropium  18 mcg Inhalation q morning - 10a   Continuous Infusions: .  sodium chloride     PRN Meds:.sodium chloride, acetaminophen **OR** acetaminophen, diazepam, guaiFENesin-dextromethorphan, ondansetron **OR** ondansetron (ZOFRAN) IV, sodium chloride flush   Data Review:   Micro Results No results found for this or any previous visit (from the past 240 hour(s)).  Radiology Reports Dg Chest 2 View  Result Date: 05/07/2018 CLINICAL DATA:  Acute shortness of breath. EXAM: CHEST - 2 VIEW COMPARISON:  06/08/2016 and prior radiographs FINDINGS: Cardiomegaly again noted. There is no evidence of focal airspace disease, pulmonary edema, suspicious pulmonary nodule/mass, pleural effusion, or pneumothorax. No acute bony abnormalities are identified. IMPRESSION: Cardiomegaly without evidence of acute cardiopulmonary disease. Electronically Signed   By: Harmon PierJeffrey  Hu M.D.   On: 05/07/2018 13:51   Ct Angio Chest Pe W Or Wo Contrast  Result Date: 05/09/2018 CLINICAL DATA:  Shortness of breath and hypoxia.  COPD exacerbation. EXAM: CT ANGIOGRAPHY CHEST WITH CONTRAST TECHNIQUE: Multidetector CT imaging of the chest was performed using the standard protocol during bolus administration of intravenous contrast. Multiplanar CT image reconstructions and MIPs were obtained to evaluate the vascular anatomy. CONTRAST:  75mL OMNIPAQUE IOHEXOL 350 MG/ML SOLN COMPARISON:  Prior CTA of the chest on 06/11/2016 FINDINGS: Cardiovascular: Pulmonary arteries are well opacified. There is no evidence of pulmonary embolism. Central pulmonary arteries are normal in caliber. The thoracic aorta is normal in caliber and demonstrates no evidence of aneurysmal disease, dissection or significant atherosclerosis. The heart is mildly enlarged. No pericardial fluid identified. No significant calcified coronary artery plaque identified. Proximal great vessels show normal patency. Mediastinum/Nodes: No enlarged mediastinal, hilar, or axillary lymph nodes. Thyroid gland, trachea, and esophagus demonstrate no  significant findings. Lungs/Pleura: Mild emphysematous lung disease with scattered areas of parenchymal scarring. Mild atelectasis at both lung bases, right greater than left. No focal airspace consolidation, overt pulmonary edema, nodule, pleural fluid or pneumothorax. Upper Abdomen: No acute abnormality. Musculoskeletal: No chest wall abnormality. No acute or significant osseous findings. Review of the MIP images confirms the above findings. IMPRESSION: 1. No evidence of pulmonary embolism or other  acute process. 2. Mild emphysematous lung disease with mild bibasilar atelectasis, right greater than left. Emphysema (ICD10-J43.9). Electronically Signed   By: Irish Lack M.D.   On: 05/09/2018 10:31     CBC Recent Labs  Lab 05/07/18 1324 05/08/18 0548  WBC 7.6 4.0  HGB 17.1* 16.7  HCT 51.4 50.3  PLT 184 155  MCV 104.9* 103.5*  MCH 34.9* 34.4*  MCHC 33.3 33.2  RDW 13.7 13.2    Chemistries  Recent Labs  Lab 05/07/18 1324 05/08/18 0548  NA 135 138  K 4.3 4.9  CL 98 101  CO2 31 33*  GLUCOSE 109* 191*  BUN 15 16  CREATININE 1.10 0.96  CALCIUM 9.1 9.1   ------------------------------------------------------------------------------------------------------------------ estimated creatinine clearance is 99.5 mL/min (by C-G formula based on SCr of 0.96 mg/dL). ------------------------------------------------------------------------------------------------------------------ No results for input(s): HGBA1C in the last 72 hours. ------------------------------------------------------------------------------------------------------------------ No results for input(s): CHOL, HDL, LDLCALC, TRIG, CHOLHDL, LDLDIRECT in the last 72 hours. ------------------------------------------------------------------------------------------------------------------ No results for input(s): TSH, T4TOTAL, T3FREE, THYROIDAB in the last 72 hours.  Invalid input(s):  FREET3 ------------------------------------------------------------------------------------------------------------------ No results for input(s): VITAMINB12, FOLATE, FERRITIN, TIBC, IRON, RETICCTPCT in the last 72 hours.  Coagulation profile No results for input(s): INR, PROTIME in the last 168 hours.  No results for input(s): DDIMER in the last 72 hours.  Cardiac Enzymes Recent Labs  Lab 05/07/18 1324  TROPONINI <0.03   ------------------------------------------------------------------------------------------------------------------ Invalid input(s): POCBNP    Assessment & Plan  Patient is 62 year old presenting with shortness of breath  1.  Acute hypoxic respiratory failure due to acute COPD exasperation: Some improvement, I have ordered a CT per PE which has been reviewed and is negative Wean oxygen as tolerated  2.  Acute on chronic COPD exasperation continue nebulizer and steroids Continue Pulmicort Negative flu test Check sputum pending Continue on oral doxycycline  3.  Hypertension continue Coreg  4.  Nicotine abuse smoking cessation provided        Code Status Orders  (From admission, onward)         Start     Ordered   05/07/18 2032  Full code  Continuous     05/07/18 2031        Code Status History    Date Active Date Inactive Code Status Order ID Comments User Context   06/08/2016 1722 06/12/2016 1753 Full Code [id number]  Katha Hamming, MD ED   06/04/2016 2248 06/07/2016 1855 Full Code [id number]  Houston Siren, MD Inpatient   03/25/2016 0246 03/26/2016 1518 Full Code [id number]  Oralia Manis, MD Inpatient           Consults none  DVT Prophylaxis  Lovenox  Lab Results  Component Value Date   PLT 155 05/08/2018     Time Spent in minutes   35 minutes greater than 50% of time spent in care coordination and counseling patient regarding the condition and plan of care.   Auburn Bilberry M.D on 05/09/2018 at 12:22 PM  Between 7am  to 6pm - Pager - [phone]  After 6pm go to [email or web address] - Social research officer, government  [employer]   Office  [phone]

## 2018-05-09 NOTE — Progress Notes (Signed)
Patient's BP still elevated after BP med given. Notified MD. New orders for Hydralazine 50mg  PO BID. Orders placed. Educated patient.

## 2018-05-10 MED ORDER — NICOTINE 21 MG/24HR TD PT24: 21.0000 mg | MEDICATED_PATCH | Freq: Every day | TRANSDERMAL | 0 refills | Status: DC

## 2018-05-10 MED ORDER — IPRATROPIUM-ALBUTEROL 0.5-2.5 (3) MG/3ML IN SOLN: 3.0000 mL | Freq: Four times a day (QID) | RESPIRATORY_TRACT | 2 refills | Status: DC

## 2018-05-10 MED ORDER — GUAIFENESIN ER 600 MG PO TB12: 600.0000 mg | ORAL_TABLET | Freq: Two times a day (BID) | ORAL | 0 refills | Status: AC

## 2018-05-10 MED ORDER — DOXYCYCLINE HYCLATE 100 MG PO TABS: 100.0000 mg | ORAL_TABLET | Freq: Two times a day (BID) | ORAL | 0 refills | Status: AC

## 2018-05-10 MED ORDER — GUAIFENESIN-DM 100-10 MG/5ML PO SYRP: 15.0000 mL | ORAL_SOLUTION | ORAL | 0 refills | Status: DC | PRN

## 2018-05-10 MED ORDER — PREDNISONE 10 MG (21) PO TBPK: ORAL_TABLET | ORAL | 0 refills | Status: DC

## 2018-05-10 NOTE — Progress Notes (Signed)
Patient was discharged home with O2. IVs removed with caths intact. Reviewed meds, scripts, and last dose taken.

## 2018-05-10 NOTE — Care Management (Addendum)
RNCM following for home O2 based on COPD.  I have requested O2 assessment be entered in Epic.  Patient has a working nebulizer but no medications for it. Update: RNCM received call from RN stating that patient does qualify and she will enter assessment. Both MD and Barbara CowerJason with [facility] updated. O2 will be delivered to patient's room prior to discharge.

## 2018-05-10 NOTE — Discharge Summary (Signed)
[facility] - [place] at [facility], 62 y.o., DOB 1956/04/19, MRN [id number]. Admission date: 05/07/2018 Discharge Date 05/10/2018 Primary MD Marguarite Arbour, MD Admitting Physician Auburn Bilberry, MD  Admission Diagnosis  Hypoxia [R09.02] COPD exacerbation [facility]) [J44.1]  Discharge Diagnosis   Active Problems: Acute on chronic COPD exasperation Acute hypoxic respiratory failure due to acute COPD exasperation Essential hypertension Nicotine abuse     Hospital Course Patient 62 year old with history of COPD presented with shortness of breath.  Further evaluation with a CT scan and chest x-ray confirms COPD without any pneumonia.  He was treated with antibiotics steroids and nebulizers.  His breathing is improved however he still requiring oxygen.  At this point he will be discharged on home oxygen.  I recommended he follow-up with pulmonary in few weeks to do pulmonary function test.             Consults  None  Significant Tests:  See full reports for all details     Dg Chest 2 View  Result Date: 05/07/2018 CLINICAL DATA:  Acute shortness of breath. EXAM: CHEST - 2 VIEW COMPARISON:  06/08/2016 and prior radiographs FINDINGS: Cardiomegaly again noted. There is no evidence of focal airspace disease, pulmonary edema, suspicious pulmonary nodule/mass, pleural effusion, or pneumothorax. No acute bony abnormalities are identified. IMPRESSION: Cardiomegaly without evidence of acute cardiopulmonary disease. Electronically Signed   By: Harmon Pier M.D.   On: 05/07/2018 13:51   Ct Angio Chest Pe W Or Wo Contrast  Result Date: 05/09/2018 CLINICAL DATA:  Shortness of breath and hypoxia.  COPD exacerbation. EXAM: CT ANGIOGRAPHY CHEST WITH CONTRAST TECHNIQUE: Multidetector CT imaging of the chest was performed using the standard protocol during bolus administration of intravenous contrast. Multiplanar CT image reconstructions and MIPs were obtained to  evaluate the vascular anatomy. CONTRAST:  75mL OMNIPAQUE IOHEXOL 350 MG/ML SOLN COMPARISON:  Prior CTA of the chest on 06/11/2016 FINDINGS: Cardiovascular: Pulmonary arteries are well opacified. There is no evidence of pulmonary embolism. Central pulmonary arteries are normal in caliber. The thoracic aorta is normal in caliber and demonstrates no evidence of aneurysmal disease, dissection or significant atherosclerosis. The heart is mildly enlarged. No pericardial fluid identified. No significant calcified coronary artery plaque identified. Proximal great vessels show normal patency. Mediastinum/Nodes: No enlarged mediastinal, hilar, or axillary lymph nodes. Thyroid gland, trachea, and esophagus demonstrate no significant findings. Lungs/Pleura: Mild emphysematous lung disease with scattered areas of parenchymal scarring. Mild atelectasis at both lung bases, right greater than left. No focal airspace consolidation, overt pulmonary edema, nodule, pleural fluid or pneumothorax. Upper Abdomen: No acute abnormality. Musculoskeletal: No chest wall abnormality. No acute or significant osseous findings. Review of the MIP images confirms the above findings. IMPRESSION: 1. No evidence of pulmonary embolism or other acute process. 2. Mild emphysematous lung disease with mild bibasilar atelectasis, right greater than left. Emphysema (ICD10-J43.9). Electronically Signed   By: Irish Lack M.D.   On: 05/09/2018 10:31       Today   Subjective:   Miguel Howell patient doing much better shortness of breath improved  Objective:   Blood pressure (!) 146/73, pulse 70, temperature 97.9 F (36.6 C), temperature source Oral, resp. rate 18, height 5\' 9"  (1.753 m), weight 108.9 kg, SpO2 91 %.  . No intake or output data in the 24 hours ending 05/10/18 1417  Exam VITAL SIGNS: Blood pressure (!) 146/73, pulse 70, temperature 97.9 F (36.6 C), temperature source Oral, resp. rate 18, height 5'  9" (1.753 m), weight 108.9  kg, SpO2 91 %.  GENERAL:  62 y.o.-year-old patient lying in the bed with no acute distress.  EYES: Pupils equal, round, reactive to light and accommodation. No scleral icterus. Extraocular muscles intact.  HEENT: Head atraumatic, normocephalic. Oropharynx and nasopharynx clear.  NECK:  Supple, no jugular venous distention. No thyroid enlargement, no tenderness.  LUNGS: Normal breath sounds bilaterally, no wheezing, rales,rhonchi or crepitation. No use of accessory muscles of respiration.  CARDIOVASCULAR: S1, S2 normal. No murmurs, rubs, or gallops.  ABDOMEN: Soft, nontender, nondistended. Bowel sounds present. No organomegaly or mass.  EXTREMITIES: No pedal edema, cyanosis, or clubbing.  NEUROLOGIC: Cranial nerves II through XII are intact. Muscle strength 5/5 in all extremities. Sensation intact. Gait not checked.  PSYCHIATRIC: The patient is alert and oriented x 3.  SKIN: No obvious rash, lesion, or ulcer.   Data Review     CBC w Diff:  Lab Results  Component Value Date   WBC 4.0 05/08/2018   HGB 16.7 05/08/2018   HCT 50.3 05/08/2018   PLT 155 05/08/2018   LYMPHOPCT 5 06/06/2016   MONOPCT 3 06/06/2016   EOSPCT 0 06/06/2016   BASOPCT 0 06/06/2016   CMP:  Lab Results  Component Value Date   NA 138 05/08/2018   K 4.9 05/08/2018   CL 101 05/08/2018   CO2 33 (H) 05/08/2018   BUN 16 05/08/2018   CREATININE 0.96 05/08/2018   PROT 7.6 06/08/2016   ALBUMIN 3.0 (L) 06/08/2016   BILITOT 1.1 06/08/2016   ALKPHOS 133 (H) 06/08/2016   AST 33 06/08/2016   ALT 113 (H) 06/08/2016  .  Micro Results No results found for this or any previous visit (from the past 240 hour(s)).      Code Status Orders  (From admission, onward)         Start     Ordered   05/07/18 2032  Full code  Continuous     05/07/18 2031        Code Status History    Date Active Date Inactive Code Status Order ID Comments User Context   06/08/2016 1722 06/12/2016 1753 Full Code [id number]  Katha Hamming, MD ED   06/04/2016 2248 06/07/2016 1855 Full Code [id number]  Houston Siren, MD Inpatient   03/25/2016 0246 03/26/2016 1518 Full Code [id number]  Oralia Manis, MD Inpatient          Follow-up Information    Marguarite Arbour, MD.   Specialty:  Internal Medicine Why:  OFFICE TO CALL  Contact information: [address] [facility] [place] [address] [phone]        Mertie Moores, MD On 05/17/2018.   Specialty:  Specialist Why:  AT 2PM Contact information: [address] [place] [address] [phone]           Discharge Medications   Allergies as of 05/10/2018   No Known Allergies     Medication List    TAKE these medications   albuterol 108 (90 Base) MCG/ACT inhaler Commonly known as:  PROVENTIL HFA;VENTOLIN HFA Inhale 2 puffs into the lungs every 4 (four) hours as needed for wheezing or shortness of breath. What changed:  Another medication with the same name was removed. Continue taking this medication, and follow the directions you see here.   aspirin EC 81 MG tablet Take 81 mg by mouth daily.   budesonide-formoterol 160-4.5 MCG/ACT inhaler Commonly known as:  SYMBICORT Inhale 2 puffs  into the lungs 2 (two) times daily.   carvedilol 6.25 MG tablet Commonly known as:  COREG Take 3.125 mg by mouth 2 (two) times daily.   diazepam 10 MG tablet Commonly known as:  VALIUM Take 10 mg by mouth at bedtime as needed for anxiety or sleep.   doxycycline 100 MG tablet Commonly known as:  VIBRA-TABS Take 1 tablet (100 mg total) by mouth every 12 (twelve) hours for 5 days.   furosemide 40 MG tablet Commonly known as:  LASIX Take 1 tablet (40 mg total) by mouth daily.   guaiFENesin 600 MG 12 hr tablet Commonly known as:  MUCINEX Take 1 tablet (600 mg total) by mouth 2 (two) times daily for 5 days.   guaiFENesin-dextromethorphan 100-10 MG/5ML syrup Commonly known as:  ROBITUSSIN DM Take 15 mLs by mouth  every 4 (four) hours as needed for cough.   ipratropium-albuterol 0.5-2.5 (3) MG/3ML Soln Commonly known as:  DUONEB Take 3 mLs by nebulization every 6 (six) hours.   montelukast 10 MG tablet Commonly known as:  SINGULAIR Take 10 mg by mouth daily.   nicotine 21 mg/24hr patch Commonly known as:  NICODERM CQ - dosed in mg/24 hours Place 1 patch (21 mg total) onto the skin daily. Start taking on:  05/11/2018   potassium chloride 10 MEQ tablet Commonly known as:  K-DUR,KLOR-CON Take 1 tablet (10 mEq total) by mouth 2 (two) times daily.   predniSONE 10 MG (21) Tbpk tablet Commonly known as:  STERAPRED UNI-PAK 21 TAB Start 60mg  taper by 10mg  until complete   tiotropium 18 MCG inhalation capsule Commonly known as:  SPIRIVA Place 18 mcg into inhaler and inhale daily.            Durable Medical Equipment  (From admission, onward)         Start     Ordered   05/10/18 0921  DME Oxygen  Once    Question Answer Comment  Mode or (Route) Nasal cannula   Liters per Minute 2   Frequency Continuous (stationary and portable oxygen unit needed)   Oxygen delivery system Gas      05/10/18 0921             Total Time in preparing paper work, data evaluation and todays exam - 35 minutes  Auburn BilberryShreyang Mekel Haverstock M.D on 05/10/2018 at 2:17 PM [facility]   Office  [phone]

## 2018-05-10 NOTE — Progress Notes (Signed)
SATURATION QUALIFICATIONS: (This note is used to comply with regulatory documentation for home oxygen)  Patient Saturations on Room Air at Rest =87%  Patient Saturations on Room Air while Ambulating = 84%  Patient Saturations on 2 Liters of oxygen while Ambulating = 90%  Please briefly explain why patient needs home oxygen: At rest, patient can't maintain oxygen sats. Hx: COPD

## 2018-05-10 NOTE — Progress Notes (Signed)
[facility] - [place] at [facility]    Arminda ResidesDaniel Marich was admitted to the Hospital on 05/07/2018 and Discharged  05/10/2018 and should be excused from work/school   For 9 days starting 05/07/2018 , may return to work/school without any restrictions.  Call Auburn BilberryShreyang Leather Estis MD with questions.  Auburn BilberryShreyang Nozomi Mettler M.D on 05/10/2018,at 9:22 AM  [employer] [facility] - [place] at [facility]    Office  [phone]

## 2018-05-13 ENCOUNTER — Telehealth: Payer: Self-pay

## 2018-05-13 NOTE — Telephone Encounter (Signed)
EMMI Follow-up: Noted on the report that the patient did not receive his discharge paperwork and did not know who to call in case there was a change in his condition.  I left my contact information for him to call me at his convenience.

## 2021-07-26 ENCOUNTER — Other Ambulatory Visit: Payer: Self-pay | Admitting: Orthopedic Surgery

## 2021-08-18 ENCOUNTER — Inpatient Hospital Stay
Admission: RE | Admit: 2021-08-18 | Discharge: 2021-08-18 | Disposition: A | Discharge status: Home / Self Care | Payer: BLUE CROSS/BLUE SHIELD | Source: Ambulatory Visit

## 2021-08-18 NOTE — Patient Instructions (Addendum)
Your procedure is scheduled on: Thursday 09/01/21 ?Report to the Registration Desk on the 1st floor of the Medical Mall. ?To find out your arrival time, please call [phone] between 1PM - 3PM on: Wednesday 08/31/21 ? ?REMEMBER: ?Instructions that are not followed completely may result in serious medical risk, up to and including death; or upon the discretion of your surgeon and anesthesiologist your surgery may need to be rescheduled. ? ?Do not eat food after midnight the night before surgery.  ?No gum chewing, lozengers or hard candies. ? ?You may however, drink CLEAR liquids up to 2 hours before you are scheduled to arrive for your surgery. Do not drink anything within 2 hours of your scheduled arrival time. ? ?Clear liquids include: ?- water  ?- apple juice without pulp ?- gatorade (not RED colors) ?- black coffee or tea (Do NOT add milk or creamers to the coffee or tea) ?Do NOT drink anything that is not on this list. ? ?TAKE THESE MEDICATIONS THE MORNING OF SURGERY WITH A SIP OF WATER: ?carvedilol (COREG) 6.25 MG tablet ?diazepam (VALIUM) 10 MG tablet (if needed) ? ? ?(take one the night before and one on the morning of surgery - helps to prevent nausea after surgery.) ? ?Use your ADVAIR HFA 230-21 MCG/ACT inhaler, albuterol (PROVENTIL HFA;VENTOLIN HFA) 108 (90 Base) MCG/ACT inhaler, Tiotropium Bromide Monohydrate (SPIRIVA RESPIMAT) 2.5 MCG/ACT AERS inhaler on the day of surgery and bring to the hospital. ? ?Dr. Rosita Kea stated to continue aspirin and just hold day of surgery. ? ?One week prior to surgery: ?Stop taking your nabumetone (RELAFEN) 500 MG tablet and any other Anti-inflammatories (NSAIDS) such as Advil, Aleve, Ibuprofen, Motrin, Naproxen, Naprosyn and Aspirin based products such as Excedrin, Goodys Powder, BC Powder. ?Stop taking your ARGININE, calcium carbonate (TUMS - DOSED IN MG ELEMENTAL CALCIUM) 500 MG chewable tablet, and ANY other OVER THE COUNTER supplements until after surgery. ?You may  however, continue to take Tylenol if needed for pain up until the day of surgery. ? ?No Alcohol for 24 hours before or after surgery. ? ?No Smoking including e-cigarettes for 24 hours prior to surgery.  ?No chewable tobacco products for at least 6 hours prior to surgery.  ?No nicotine patches on the day of surgery. ? ?Do not use any "recreational" drugs for at least a week prior to your surgery.  ?Please be advised that the combination of cocaine and anesthesia may have negative outcomes, up to and including death. ?If you test positive for cocaine, your surgery will be cancelled. ? ?On the morning of surgery brush your teeth with toothpaste and water, you may rinse your mouth with mouthwash if you wish. ?Do not swallow any toothpaste or mouthwash. ? ?Use CHG Soap as directed on instruction sheet. ? ?Do not wear jewelry. ? ?Do not wear lotions, powders, or colognes.  ? ?Do not shave body from the neck down 48 hours prior to surgery just in case you cut yourself which could leave a site for infection.  ?Also, freshly shaved skin may become irritated if using the CHG soap. ? ?Contact lenses, hearing aids and dentures may not be worn into surgery. ? ?Do not bring valuables to the hospital. [facility] is not responsible for any missing/lost belongings or valuables.  ? ?Notify your doctor if there is any change in your medical condition (cold, fever, infection). ? ?Wear comfortable clothing (specific to your surgery type) to the hospital. ? ?After surgery, you can help prevent lung complications by doing breathing  exercises.  ?Take deep breaths and cough every 1-2 hours. Your doctor may order a device called an Incentive Spirometer to help you take deep breaths. ? ?If you are being admitted to the hospital overnight, leave your suitcase in the car. ?After surgery it may be brought to your room. ? ?If you are taking public transportation, you will need to have a responsible adult (18 years or older) with you. ?Please  confirm with your physician that it is acceptable to use public transportation.  ? ?Please call the Pre-admissions Testing Dept. at [phone] if you have any questions about these instructions. ? ?Surgery Visitation Policy: ? ?Patients undergoing a surgery or procedure may have one family member or support person with them as long as that person is not COVID-19 positive or experiencing its symptoms.  ?That person may remain in the waiting area during the procedure and may rotate out with other people. ? ?Inpatient Visitation:   ? ?Visiting hours are 7 a.m. to 8 p.m. ?Up to two visitors ages 16+ are allowed at one time in a patient room. The visitors may rotate out with other people during the day. Visitors must check out when they leave, or other visitors will not be allowed. One designated support person may remain overnight. ?The visitor must pass COVID-19 screenings, use hand sanitizer when entering and exiting the patient?s room and wear a mask at all times, including in the patient?s room. ?Patients must also wear a mask when staff or their visitor are in the room. ?Masking is required regardless of vaccination status.  ?

## 2021-08-22 ENCOUNTER — Other Ambulatory Visit: Payer: Self-pay

## 2021-08-22 ENCOUNTER — Encounter
Admission: RE | Admit: 2021-08-22 | Discharge: 2021-08-22 | Disposition: A | Discharge status: Home / Self Care | Payer: 59 | Source: Ambulatory Visit | Attending: Orthopedic Surgery | Admitting: Orthopedic Surgery

## 2021-08-22 VITALS — HR 85 | Temp 97.4°F | Resp 18 | Ht 68.5 in | Wt 217.8 lb

## 2021-08-22 DIAGNOSIS — Z01818 Encounter for other preprocedural examination: Secondary | ICD-10-CM | POA: Diagnosis present

## 2021-08-22 DIAGNOSIS — Z01810 Encounter for preprocedural cardiovascular examination: Secondary | ICD-10-CM | POA: Diagnosis not present

## 2021-08-22 HISTORY — DX: Heart failure, unspecified: I50.9

## 2021-08-22 HISTORY — DX: Dyspnea, unspecified: R06.00

## 2021-08-22 HISTORY — DX: Unspecified osteoarthritis, unspecified site: M19.90

## 2021-08-22 LAB — URINALYSIS, ROUTINE W REFLEX MICROSCOPIC
Bilirubin Urine: NEGATIVE
Glucose, UA: NEGATIVE mg/dL
Ketones, ur: NEGATIVE mg/dL
Nitrite: NEGATIVE
Protein, ur: NEGATIVE mg/dL
Specific Gravity, Urine: 1.013 (ref 1.005–1.030)
pH: 5 (ref 5.0–8.0)

## 2021-08-22 LAB — COMPREHENSIVE METABOLIC PANEL
ALT: 16 U/L (ref 0–44)
AST: 16 U/L (ref 15–41)
Albumin: 4 g/dL (ref 3.5–5.0)
Alkaline Phosphatase: 100 U/L (ref 38–126)
Anion gap: 5 (ref 5–15)
BUN: 14 mg/dL (ref 8–23)
CO2: 30 mmol/L (ref 22–32)
Calcium: 10 mg/dL (ref 8.9–10.3)
Chloride: 99 mmol/L (ref 98–111)
Creatinine, Ser: 1.08 mg/dL (ref 0.61–1.24)
GFR, Estimated: >60 mL/min (ref >60)
Glucose, Bld: 101 mg/dL — ABNORMAL HIGH (ref 70–99)
Potassium: 3.8 mmol/L (ref 3.5–5.1)
Sodium: 134 mmol/L — ABNORMAL LOW (ref 135–145)
Total Bilirubin: 0.6 mg/dL (ref 0.3–1.2)
Total Protein: 7.1 g/dL (ref 6.5–8.1)

## 2021-08-22 LAB — CBC WITH DIFFERENTIAL/PLATELET
Abs Immature Granulocytes: 0.04 10*3/uL (ref 0.00–0.07)
Basophils Absolute: 0 10*3/uL (ref 0.0–0.1)
Basophils Relative: 0 %
Eosinophils Absolute: 0.1 10*3/uL (ref 0.0–0.5)
Eosinophils Relative: 1 %
HCT: 43.7 % (ref 39.0–52.0)
Hemoglobin: 14.5 g/dL (ref 13.0–17.0)
Immature Granulocytes: 1 %
Lymphocytes Relative: 38 %
Lymphs Abs: 2.4 10*3/uL (ref 0.7–4.0)
MCH: 32.6 pg (ref 26.0–34.0)
MCHC: 33.2 g/dL (ref 30.0–36.0)
MCV: 98.2 fL (ref 80.0–100.0)
Monocytes Absolute: 0.4 10*3/uL (ref 0.1–1.0)
Monocytes Relative: 7 %
Neutro Abs: 3.3 10*3/uL (ref 1.7–7.7)
Neutrophils Relative %: 53 %
Platelets: 169 10*3/uL (ref 150–400)
RBC: 4.45 MIL/uL (ref 4.22–5.81)
RDW: 12.6 % (ref 11.5–15.5)
WBC: 6.2 10*3/uL (ref 4.0–10.5)
nRBC: 0 % (ref 0.0–0.2)

## 2021-08-22 LAB — TYPE AND SCREEN
ABO/RH(D): O NEG
Antibody Screen: NEGATIVE

## 2021-08-22 LAB — SURGICAL PCR SCREEN
MRSA, PCR: NEGATIVE
Staphylococcus aureus: NEGATIVE

## 2021-08-22 NOTE — Patient Instructions (Addendum)
Your procedure is scheduled on: Thursday 09/01/21 ?Report to the Registration Desk on the 1st floor of the Medical Mall. ?To find out your arrival time, please call [phone] between 1PM - 3PM on: Wednesday 08/31/21 ? ?REMEMBER: ?Instructions that are not followed completely may result in serious medical risk, up to and including death; or upon the discretion of your surgeon and anesthesiologist your surgery may need to be rescheduled. ? ?Do not eat food after midnight the night before surgery.  ?No gum chewing, lozengers or hard candies. ? ?You may however, drink CLEAR liquids up to 2 hours before you are scheduled to arrive for your surgery. Do not drink anything within 2 hours of your scheduled arrival time. ? ?Clear liquids include: ?- water  ?- apple juice without pulp ?- gatorade (not RED colors) ?- black coffee or tea (Do NOT add milk or creamers to the coffee or tea) ?Do NOT drink anything that is not on this list. ? ? ?TAKE THESE MEDICATIONS THE MORNING OF SURGERY WITH A SIP OF WATER: ?carvedilol (COREG) 6.25 MG tablet ?diazepam (VALIUM) 10 MG tablet if needed ? ? ?Use your ADVAIR HFA 230-21 MCG/ACT inhaler, albuterol (PROVENTIL HFA;VENTOLIN HFA) 108 (90 Base) MCG/ACT inhaler, Tiotropium Bromide Monohydrate (SPIRIVA RESPIMAT) 2.5 MCG/ACT AERS on the day of surgery and bring to the hospital. ? ?Dr Rosita Kea stated for you to continue your Aspirin and just hold it the day of surgery ? ?One week prior to surgery: ?Stop taking your nabumetone (RELAFEN) 500 MG tablet and any other Anti-inflammatories (NSAIDS) such as Advil, Aleve, Ibuprofen, Motrin, Naproxen, Naprosyn and Aspirin based products such as Excedrin, Goodys Powder, BC Powder. ? ?Stop taking your ARGININE PO, calcium carbonate (TUMS - DOSED IN MG ELEMENTAL CALCIUM) 500 MG chewable tablet, PSYLLIUM HUSK PO, and ANY other OVER THE COUNTER supplements until after surgery. ? ?You may however, continue to take Tylenol if needed for pain up until the day of  surgery. ? ?No Alcohol for 24 hours before or after surgery. ? ?No Smoking including e-cigarettes for 24 hours prior to surgery.  ?No chewable tobacco products for at least 6 hours prior to surgery.  ?No nicotine patches on the day of surgery. ? ?Do not use any "recreational" drugs for at least a week prior to your surgery.  ?Please be advised that the combination of cocaine and anesthesia may have negative outcomes, up to and including death. ?If you test positive for cocaine, your surgery will be cancelled. ? ?On the morning of surgery brush your teeth with toothpaste and water, you may rinse your mouth with mouthwash if you wish. ?Do not swallow any toothpaste or mouthwash. ? ?Use CHG Soap as directed on instruction sheet. ? ?Do not wear jewelry. ? ?Do not wear lotions, powders, or colognes.  ? ?Do not shave body from the neck down 48 hours prior to surgery just in case you cut yourself which could leave a site for infection.  ?Also, freshly shaved skin may become irritated if using the CHG soap. ? ?Do not bring valuables to the hospital. [facility] is not responsible for any missing/lost belongings or valuables.  ? ?Notify your doctor if there is any change in your medical condition (cold, fever, infection). ? ?Wear comfortable clothing (specific to your surgery type) to the hospital. ? ?After surgery, you can help prevent lung complications by doing breathing exercises.  ?Take deep breaths and cough every 1-2 hours. Your doctor may order a device called an Incentive Spirometer to help  you take deep breaths. ? ?If you are being admitted to the hospital overnight, leave your suitcase in the car. ?After surgery it may be brought to your room. ? ?If you are taking public transportation, you will need to have a responsible adult (18 years or older) with you. ?Please confirm with your physician that it is acceptable to use public transportation.  ? ?Please call the Pre-admissions Testing Dept. at [phone] if  you have any questions about these instructions. ? ?Surgery Visitation Policy: ? ?Patients undergoing a surgery or procedure may have two family members or support persons with them as long as the person is not COVID-19 positive or experiencing its symptoms.  ? ?Inpatient Visitation:   ? ?Visiting hours are 7 a.m. to 8 p.m. ?Up to four visitors are allowed at one time in a patient room, including children. The visitors may rotate out with other people during the day. One designated support person (adult) may remain overnight. ? ?All Areas: ?All visitors must pass COVID-19 screenings, use hand sanitizer when entering and exiting the patient?s room and wear a mask at all times, including in the patient?s room. ?Patients must also wear a mask when staff or their visitor are in the room. ?Masking is required regardless of vaccination status.  ?

## 2021-08-24 LAB — URINE CULTURE: Culture: 70000 — AB

## 2021-08-24 NOTE — Progress Notes (Signed)
?  [facility] ?Perioperative Services: Pre-Admission/Anesthesia Testing ? ?Abnormal Lab Notification  ?Date: 08/24/21 ? ?Name: Miguel Howell ?MRN:   [id number] ? ?Re: Abnormal labs noted during PAT appointment  ? ?Notified:  ?Provider Name Provider Role Notification Mode  ?Kennedy Bucker, MD Orthopedics (Surgeon) Routed and/or faxed via [facility]  ? ?Abnormal Lab Value(s):  ? ?Lab Results  ?Component Value Date  ? COLORURINE YELLOW (A) 08/22/2021  ? APPEARANCEUR HAZY (A) 08/22/2021  ? LABSPEC 1.013 08/22/2021  ? PHURINE 5.0 08/22/2021  ? GLUCOSEU NEGATIVE 08/22/2021  ? HGBUR SMALL (A) 08/22/2021  ? BILIRUBINUR NEGATIVE 08/22/2021  ? KETONESUR NEGATIVE 08/22/2021  ? PROTEINUR NEGATIVE 08/22/2021  ? NITRITE NEGATIVE 08/22/2021  ? LEUKOCYTESUR MODERATE (A) 08/22/2021  ? EPIU 0-5 08/22/2021  ? WBCU 21-50 08/22/2021  ? RBCU 0-5 08/22/2021  ? BACTERIA RARE (A) 08/22/2021  ? CULT (A) 08/22/2021  ?  70,000 COLONIES/mL VIRIDANS STREPTOCOCCUS ?Standardized susceptibility testing for this organism is not available. ?Performed at [facility], [address], [place], [place] [address] ?  ? ?Clinical Information and Notes:  ?Patient is scheduled for TOTAL HIP ARTHROPLASTY ANTERIOR APPROACH (Right: Hip) on 09/01/2021.   ? ?UA performed in PAT concerning for infection.  ?No leukocytosis noted on CBC; WBC 6200 K/uL ?Renal function: Estimated Creatinine Clearance: 79.5 mL/min (by C-G formula based on SCr of 1.08 mg/dL). ?Urine C&S added to assess for pathogenically significant growth. ? ?Impression and Plan:  ?Miguel Howell with a UA that was (+) for infection. Subsequent culture sent that grew out 70,000 CFU/mL Viridans Streptococcus.  Standard susceptibility testing for this organism not available. This pathogen is often found to be a contaminant, however patient was contacted to discuss whether or not he was experiencing symptoms.  Patient noted that he had some "back and abdominal cramping" a couple of  weeks ago.  He denies any urinary symptoms; no dysuria, frequency, urgency.  No nausea, vomiting, fever/chills.  Of note, patient advising that he is going today to be tested for possible STI after learning that a recent sexual partner tested positive for chlamydia.  Denied any obvious penile discharge or testicular pain.   ? ?Deferring treatment at this time, as patient is seeking care for possible STI following known exposure to STI.  VGS isolates are typically treated with PCN or cephalosporin, while chlamydia is typically treated with doxycycline or azithromycin.  Efforts being made to avoid unnecessary antimicrobial coverage in presurgical patient.  Sending results to primary attending surgeon for review, as there may be some consideration lent to empiric coverage given his upcoming orthopedic surgery.  ? ?Quentin Mulling, MSN, APRN, FNP-C, CEN ?[facility]  ?Peri-operative Services Nurse Practitioner ?Phone: [phone] ?Fax: [phone] ?08/24/21 3:13 PM ? ?NOTE: This note has been prepared using Scientist, clinical (histocompatibility and immunogenetics). Despite my best ability to proofread, there is always the potential that unintentional transcriptional errors may still occur from this process. ? ?

## 2021-08-30 ENCOUNTER — Other Ambulatory Visit: Payer: 59

## 2021-08-31 MED ORDER — LACTATED RINGERS IV SOLN: INTRAVENOUS | Status: DC

## 2021-08-31 MED ORDER — CEFAZOLIN SODIUM-DEXTROSE 2-4 GM/100ML-% IV SOLN
2.0000 g | INTRAVENOUS | Status: AC
  Administered 2021-09-01: 2 g via INTRAVENOUS

## 2021-08-31 MED ORDER — CHLORHEXIDINE GLUCONATE 0.12 % MT SOLN: 15.0000 mL | Freq: Once | OROMUCOSAL | Status: AC

## 2021-08-31 MED ORDER — ORAL CARE MOUTH RINSE: 15.0000 mL | Freq: Once | OROMUCOSAL | Status: AC

## 2021-09-01 ENCOUNTER — Observation Stay
Admission: RE | Admit: 2021-09-01 | Discharge: 2021-09-02 | Disposition: A | Discharge status: Home Health | Payer: 59 | Attending: Orthopedic Surgery | Admitting: Orthopedic Surgery

## 2021-09-01 ENCOUNTER — Other Ambulatory Visit: Payer: Self-pay

## 2021-09-01 ENCOUNTER — Ambulatory Visit: Payer: 59 | Admitting: Urgent Care

## 2021-09-01 ENCOUNTER — Ambulatory Visit: Payer: 59

## 2021-09-01 ENCOUNTER — Encounter
Admission: RE | Disposition: A | Discharge status: Home Health | Payer: Self-pay | Source: Home / Self Care | Attending: Orthopedic Surgery

## 2021-09-01 ENCOUNTER — Observation Stay: Payer: 59

## 2021-09-01 ENCOUNTER — Encounter: Payer: Self-pay | Admitting: Orthopedic Surgery

## 2021-09-01 DIAGNOSIS — J45909 Unspecified asthma, uncomplicated: Secondary | ICD-10-CM | POA: Diagnosis not present

## 2021-09-01 DIAGNOSIS — J9620 Acute and chronic respiratory failure, unspecified whether with hypoxia or hypercapnia: Secondary | ICD-10-CM | POA: Insufficient documentation

## 2021-09-01 DIAGNOSIS — G9340 Encephalopathy, unspecified: Secondary | ICD-10-CM | POA: Insufficient documentation

## 2021-09-01 DIAGNOSIS — I110 Hypertensive heart disease with heart failure: Secondary | ICD-10-CM | POA: Insufficient documentation

## 2021-09-01 DIAGNOSIS — M1611 Unilateral primary osteoarthritis, right hip: Principal | ICD-10-CM | POA: Insufficient documentation

## 2021-09-01 DIAGNOSIS — J441 Chronic obstructive pulmonary disease with (acute) exacerbation: Secondary | ICD-10-CM | POA: Diagnosis not present

## 2021-09-01 DIAGNOSIS — Z79899 Other long term (current) drug therapy: Secondary | ICD-10-CM | POA: Insufficient documentation

## 2021-09-01 DIAGNOSIS — Z7982 Long term (current) use of aspirin: Secondary | ICD-10-CM | POA: Diagnosis not present

## 2021-09-01 DIAGNOSIS — E876 Hypokalemia: Secondary | ICD-10-CM | POA: Diagnosis not present

## 2021-09-01 DIAGNOSIS — I5033 Acute on chronic diastolic (congestive) heart failure: Secondary | ICD-10-CM | POA: Diagnosis not present

## 2021-09-01 DIAGNOSIS — E875 Hyperkalemia: Secondary | ICD-10-CM | POA: Insufficient documentation

## 2021-09-01 DIAGNOSIS — Z96641 Presence of right artificial hip joint: Secondary | ICD-10-CM

## 2021-09-01 HISTORY — PX: TOTAL HIP ARTHROPLASTY: SHX124

## 2021-09-01 LAB — CBC
HCT: 41.1 % (ref 39.0–52.0)
Hemoglobin: 13.3 g/dL (ref 13.0–17.0)
MCH: 32.7 pg (ref 26.0–34.0)
MCHC: 32.4 g/dL (ref 30.0–36.0)
MCV: 101 fL — ABNORMAL HIGH (ref 80.0–100.0)
Platelets: 161 10*3/uL (ref 150–400)
RBC: 4.07 MIL/uL — ABNORMAL LOW (ref 4.22–5.81)
RDW: 12.7 % (ref 11.5–15.5)
WBC: 6.9 10*3/uL (ref 4.0–10.5)
nRBC: 0 % (ref 0.0–0.2)

## 2021-09-01 LAB — CREATININE, SERUM
Creatinine, Ser: 0.92 mg/dL (ref 0.61–1.24)
GFR, Estimated: >60 mL/min (ref >60)

## 2021-09-01 LAB — ABO/RH: ABO/RH(D): O NEG

## 2021-09-01 SURGERY — ARTHROPLASTY, HIP, TOTAL, ANTERIOR APPROACH
Anesthesia: Spinal | Site: Hip | Laterality: Right

## 2021-09-01 MED ORDER — FLEET ENEMA 7-19 GM/118ML RE ENEM: 1.0000 | ENEMA | Freq: Once | RECTAL | Status: DC | PRN

## 2021-09-01 MED ORDER — ASPIRIN EC 81 MG PO TBEC
81.0000 mg | DELAYED_RELEASE_TABLET | Freq: Every day | ORAL | Status: DC
  Administered 2021-09-01 – 2021-09-02 (×2): 81 mg via ORAL
  Filled 2021-09-01 (×2): qty 1

## 2021-09-01 MED ORDER — ACETAMINOPHEN 325 MG PO TABS: 325.0000 mg | ORAL_TABLET | Freq: Four times a day (QID) | ORAL | Status: DC | PRN

## 2021-09-01 MED ORDER — OXYCODONE HCL 5 MG PO TABS: 5.0000 mg | ORAL_TABLET | ORAL | Status: DC | PRN

## 2021-09-01 MED ORDER — HEMOSTATIC AGENTS (NO CHARGE) OPTIME
TOPICAL | Status: DC | PRN
  Administered 2021-09-01: 2 via TOPICAL

## 2021-09-01 MED ORDER — GLYCOPYRROLATE 0.2 MG/ML IJ SOLN
INTRAMUSCULAR | Status: AC
  Filled 2021-09-01: qty 1

## 2021-09-01 MED ORDER — MENTHOL 3 MG MT LOZG
1.0000 | LOZENGE | OROMUCOSAL | Status: DC | PRN
  Filled 2021-09-01: qty 9

## 2021-09-01 MED ORDER — CHLORHEXIDINE GLUCONATE 0.12 % MT SOLN
OROMUCOSAL | Status: AC
  Administered 2021-09-01: 15 mL via OROMUCOSAL
  Filled 2021-09-01: qty 15

## 2021-09-01 MED ORDER — ALBUTEROL SULFATE (2.5 MG/3ML) 0.083% IN NEBU: 2.5000 mg | INHALATION_SOLUTION | RESPIRATORY_TRACT | Status: DC | PRN

## 2021-09-01 MED ORDER — EPHEDRINE 5 MG/ML INJ
INTRAVENOUS | Status: AC
  Filled 2021-09-01: qty 5

## 2021-09-01 MED ORDER — SODIUM CHLORIDE 0.9 % IR SOLN: Status: DC | PRN

## 2021-09-01 MED ORDER — MIDAZOLAM HCL 5 MG/5ML IJ SOLN
INTRAMUSCULAR | Status: DC | PRN
  Administered 2021-09-01: 2 mg via INTRAVENOUS

## 2021-09-01 MED ORDER — ONDANSETRON HCL 4 MG/2ML IJ SOLN: 4.0000 mg | Freq: Once | INTRAMUSCULAR | Status: DC | PRN

## 2021-09-01 MED ORDER — FENTANYL CITRATE (PF) 100 MCG/2ML IJ SOLN
INTRAMUSCULAR | Status: AC
  Filled 2021-09-01: qty 2

## 2021-09-01 MED ORDER — EPHEDRINE SULFATE (PRESSORS) 50 MG/ML IJ SOLN
INTRAMUSCULAR | Status: DC | PRN
  Administered 2021-09-01: 5 mg via INTRAVENOUS

## 2021-09-01 MED ORDER — HYDROMORPHONE HCL 1 MG/ML IJ SOLN
0.5000 mg | INTRAMUSCULAR | Status: DC | PRN
  Administered 2021-09-01 – 2021-09-02 (×2): 1 mg via INTRAVENOUS
  Filled 2021-09-01 (×2): qty 1

## 2021-09-01 MED ORDER — SODIUM CHLORIDE (PF) 0.9 % IJ SOLN
INTRAMUSCULAR | Status: DC | PRN
  Administered 2021-09-01: 90 mL via INTRAMUSCULAR

## 2021-09-01 MED ORDER — PHENYLEPHRINE HCL-NACL 20-0.9 MG/250ML-% IV SOLN
INTRAVENOUS | Status: DC | PRN
  Administered 2021-09-01: 40 ug/min via INTRAVENOUS

## 2021-09-01 MED ORDER — MONTELUKAST SODIUM 10 MG PO TABS
10.0000 mg | ORAL_TABLET | Freq: Every day | ORAL | Status: DC
  Administered 2021-09-01 – 2021-09-02 (×2): 10 mg via ORAL
  Filled 2021-09-01 (×2): qty 1

## 2021-09-01 MED ORDER — DIPHENHYDRAMINE HCL 12.5 MG/5ML PO ELIX: 12.5000 mg | ORAL_SOLUTION | ORAL | Status: DC | PRN

## 2021-09-01 MED ORDER — TRAMADOL HCL 50 MG PO TABS
50.0000 mg | ORAL_TABLET | Freq: Four times a day (QID) | ORAL | Status: DC
  Administered 2021-09-01 – 2021-09-02 (×4): 50 mg via ORAL
  Filled 2021-09-01 (×4): qty 1

## 2021-09-01 MED ORDER — FLUTICASONE FUROATE-VILANTEROL 200-25 MCG/ACT IN AEPB
1.0000 | INHALATION_SPRAY | Freq: Every day | RESPIRATORY_TRACT | Status: DC
Start: 2021-09-02 — End: 2021-09-02
  Administered 2021-09-02: 1 via RESPIRATORY_TRACT
  Filled 2021-09-01: qty 28

## 2021-09-01 MED ORDER — CARVEDILOL 3.125 MG PO TABS
3.1250 mg | ORAL_TABLET | Freq: Two times a day (BID) | ORAL | Status: DC
  Administered 2021-09-01 – 2021-09-02 (×2): 3.125 mg via ORAL
  Filled 2021-09-01 (×2): qty 1

## 2021-09-01 MED ORDER — BISACODYL 5 MG PO TBEC
5.0000 mg | DELAYED_RELEASE_TABLET | Freq: Every day | ORAL | Status: DC | PRN
  Administered 2021-09-01 – 2021-09-02 (×2): 5 mg via ORAL
  Filled 2021-09-01 (×2): qty 1

## 2021-09-01 MED ORDER — PHENOL 1.4 % MT LIQD
1.0000 | OROMUCOSAL | Status: DC | PRN
  Filled 2021-09-01: qty 177

## 2021-09-01 MED ORDER — SODIUM CHLORIDE 0.9 % IV SOLN: INTRAVENOUS | Status: DC

## 2021-09-01 MED ORDER — FENTANYL CITRATE (PF) 100 MCG/2ML IJ SOLN
INTRAMUSCULAR | Status: DC | PRN
  Administered 2021-09-01 (×2): 50 ug via INTRAVENOUS

## 2021-09-01 MED ORDER — TIOTROPIUM BROMIDE MONOHYDRATE 18 MCG IN CAPS
1.0000 | ORAL_CAPSULE | Freq: Two times a day (BID) | RESPIRATORY_TRACT | Status: DC
  Administered 2021-09-01 – 2021-09-02 (×2): 18 ug via RESPIRATORY_TRACT
  Filled 2021-09-01: qty 5

## 2021-09-01 MED ORDER — GLYCOPYRROLATE 0.2 MG/ML IJ SOLN
0.2000 mg | Freq: Once | INTRAMUSCULAR | Status: AC
  Administered 2021-09-01: 0.2 mg via INTRAVENOUS

## 2021-09-01 MED ORDER — METHOCARBAMOL 500 MG PO TABS
500.0000 mg | ORAL_TABLET | Freq: Four times a day (QID) | ORAL | Status: DC | PRN
  Administered 2021-09-01 – 2021-09-02 (×2): 500 mg via ORAL
  Filled 2021-09-01 (×2): qty 1

## 2021-09-01 MED ORDER — DOCUSATE SODIUM 100 MG PO CAPS
100.0000 mg | ORAL_CAPSULE | Freq: Two times a day (BID) | ORAL | Status: DC
  Administered 2021-09-01 – 2021-09-02 (×2): 100 mg via ORAL
  Filled 2021-09-01 (×2): qty 1

## 2021-09-01 MED ORDER — BUPIVACAINE HCL (PF) 0.5 % IJ SOLN
INTRAMUSCULAR | Status: DC | PRN
  Administered 2021-09-01: 2.3 mL

## 2021-09-01 MED ORDER — DIAZEPAM 5 MG PO TABS: 10.0000 mg | ORAL_TABLET | Freq: Every evening | ORAL | Status: DC | PRN

## 2021-09-01 MED ORDER — CEFAZOLIN SODIUM-DEXTROSE 2-4 GM/100ML-% IV SOLN
INTRAVENOUS | Status: AC
  Administered 2021-09-01: 2 g via INTRAVENOUS
  Filled 2021-09-01: qty 100

## 2021-09-01 MED ORDER — MAGNESIUM HYDROXIDE 400 MG/5ML PO SUSP
30.0000 mL | Freq: Every day | ORAL | Status: DC
  Administered 2021-09-01: 30 mL via ORAL
  Filled 2021-09-01: qty 30

## 2021-09-01 MED ORDER — MIDAZOLAM HCL 2 MG/2ML IJ SOLN
INTRAMUSCULAR | Status: AC
  Filled 2021-09-01: qty 2

## 2021-09-01 MED ORDER — OXYCODONE HCL 5 MG PO TABS
10.0000 mg | ORAL_TABLET | ORAL | Status: DC | PRN
  Administered 2021-09-01: 15 mg via ORAL
  Administered 2021-09-01 – 2021-09-02 (×2): 10 mg via ORAL
  Filled 2021-09-01: qty 3
  Filled 2021-09-01 (×2): qty 2

## 2021-09-01 MED ORDER — FENTANYL CITRATE (PF) 100 MCG/2ML IJ SOLN: 25.0000 ug | INTRAMUSCULAR | Status: DC | PRN

## 2021-09-01 MED ORDER — TRANEXAMIC ACID 1000 MG/10ML IV SOLN
INTRAVENOUS | Status: AC
  Filled 2021-09-01: qty 10

## 2021-09-01 MED ORDER — METOCLOPRAMIDE HCL 10 MG PO TABS: 5.0000 mg | ORAL_TABLET | Freq: Three times a day (TID) | ORAL | Status: DC | PRN

## 2021-09-01 MED ORDER — TRANEXAMIC ACID-NACL 1000-0.7 MG/100ML-% IV SOLN
INTRAVENOUS | Status: DC | PRN
  Administered 2021-09-01: 1000 mg via INTRAVENOUS

## 2021-09-01 MED ORDER — METOCLOPRAMIDE HCL 5 MG/ML IJ SOLN: 5.0000 mg | Freq: Three times a day (TID) | INTRAMUSCULAR | Status: DC | PRN

## 2021-09-01 MED ORDER — ACETAMINOPHEN 500 MG PO TABS
1000.0000 mg | ORAL_TABLET | Freq: Four times a day (QID) | ORAL | Status: AC
  Administered 2021-09-01 – 2021-09-02 (×4): 1000 mg via ORAL
  Filled 2021-09-01 (×4): qty 2

## 2021-09-01 MED ORDER — ACETAMINOPHEN 10 MG/ML IV SOLN
INTRAVENOUS | Status: AC
  Filled 2021-09-01: qty 100

## 2021-09-01 MED ORDER — CEFAZOLIN SODIUM-DEXTROSE 2-4 GM/100ML-% IV SOLN
2.0000 g | Freq: Four times a day (QID) | INTRAVENOUS | Status: AC
  Administered 2021-09-01: 2 g via INTRAVENOUS
  Filled 2021-09-01 (×2): qty 100

## 2021-09-01 MED ORDER — ALUM & MAG HYDROXIDE-SIMETH 200-200-20 MG/5ML PO SUSP: 30.0000 mL | ORAL | Status: DC | PRN

## 2021-09-01 MED ORDER — ACETAMINOPHEN 10 MG/ML IV SOLN
INTRAVENOUS | Status: DC | PRN
  Administered 2021-09-01: 1000 mg via INTRAVENOUS

## 2021-09-01 MED ORDER — ONDANSETRON HCL 4 MG PO TABS
4.0000 mg | ORAL_TABLET | Freq: Four times a day (QID) | ORAL | Status: DC | PRN
Start: 2021-09-01 — End: 2021-09-02

## 2021-09-01 MED ORDER — METHOCARBAMOL 1000 MG/10ML IJ SOLN
500.0000 mg | Freq: Four times a day (QID) | INTRAVENOUS | Status: DC | PRN
  Filled 2021-09-01: qty 5

## 2021-09-01 MED ORDER — ONDANSETRON HCL 4 MG/2ML IJ SOLN: 4.0000 mg | Freq: Four times a day (QID) | INTRAMUSCULAR | Status: DC | PRN

## 2021-09-01 MED ORDER — PROPOFOL 1000 MG/100ML IV EMUL
INTRAVENOUS | Status: AC
  Filled 2021-09-01: qty 100

## 2021-09-01 MED ORDER — POTASSIUM CHLORIDE CRYS ER 10 MEQ PO TBCR
10.0000 meq | EXTENDED_RELEASE_TABLET | Freq: Two times a day (BID) | ORAL | Status: DC
  Administered 2021-09-01 – 2021-09-02 (×2): 10 meq via ORAL
  Filled 2021-09-01 (×2): qty 1

## 2021-09-01 MED ORDER — SENNOSIDES-DOCUSATE SODIUM 8.6-50 MG PO TABS: 1.0000 | ORAL_TABLET | Freq: Every evening | ORAL | Status: DC | PRN

## 2021-09-01 MED ORDER — FUROSEMIDE 40 MG PO TABS
40.0000 mg | ORAL_TABLET | Freq: Every day | ORAL | Status: DC
  Administered 2021-09-01 – 2021-09-02 (×2): 40 mg via ORAL
  Filled 2021-09-01 (×2): qty 1

## 2021-09-01 MED ORDER — PROPOFOL 500 MG/50ML IV EMUL
INTRAVENOUS | Status: DC | PRN
  Administered 2021-09-01: 2 ug/kg/min via INTRAVENOUS
  Administered 2021-09-01: 75 ug/kg/min via INTRAVENOUS
  Administered 2021-09-01: 100 ug/kg/min via INTRAVENOUS

## 2021-09-01 MED ORDER — CALCIUM CARBONATE ANTACID 500 MG PO CHEW: 1.0000 | CHEWABLE_TABLET | Freq: Every day | ORAL | Status: DC | PRN

## 2021-09-01 MED ORDER — ENOXAPARIN SODIUM 40 MG/0.4ML IJ SOSY
40.0000 mg | PREFILLED_SYRINGE | INTRAMUSCULAR | Status: DC
  Administered 2021-09-02: 40 mg via SUBCUTANEOUS
  Filled 2021-09-01: qty 0.4

## 2021-09-01 SURGICAL SUPPLY — 53 items
BLADE SAGITTAL AGGR TOOTH XLG (BLADE) ×2 IMPLANT
BNDG COHESIVE 6X5 TAN ST LF (GAUZE/BANDAGES/DRESSINGS) ×5 IMPLANT
CANISTER WOUND CARE 500ML ATS (WOUND CARE) ×2 IMPLANT
CHLORAPREP W/TINT 26 (MISCELLANEOUS) ×2 IMPLANT
COVER BACK TABLE REUSABLE LG (DRAPES) ×2 IMPLANT
DRAPE 3/4 80X56 (DRAPES) ×6 IMPLANT
DRAPE C-ARM XRAY 36X54 (DRAPES) ×2 IMPLANT
DRAPE INCISE IOBAN 66X60 STRL (DRAPES) IMPLANT
DRAPE POUCH INSTRU U-SHP 10X18 (DRAPES) ×2 IMPLANT
DRESSING SURGICEL FIBRLLR 1X2 (HEMOSTASIS) ×2 IMPLANT
DRSG MEPILEX SACRM 8.7X9.8 (GAUZE/BANDAGES/DRESSINGS) ×2 IMPLANT
DRSG SURGICEL FIBRILLAR 1X2 (HEMOSTASIS) ×4
ELECT BLADE 6.5 EXT (BLADE) ×2 IMPLANT
ELECT REM PT RETURN 9FT ADLT (ELECTROSURGICAL) ×2
ELECTRODE REM PT RTRN 9FT ADLT (ELECTROSURGICAL) ×1 IMPLANT
GLOVE SURG SYN 9.0  PF PI (GLOVE) ×2
GLOVE SURG SYN 9.0 PF PI (GLOVE) ×2 IMPLANT
GLOVE SURG UNDER POLY LF SZ9 (GLOVE) ×2 IMPLANT
GOWN SRG 2XL LVL 4 RGLN SLV (GOWNS) ×1 IMPLANT
GOWN STRL NON-REIN 2XL LVL4 (GOWNS) ×1
GOWN STRL REUS W/ TWL LRG LVL3 (GOWN DISPOSABLE) ×1 IMPLANT
GOWN STRL REUS W/TWL LRG LVL3 (GOWN DISPOSABLE) ×1
HIP FEM HD L 28 (Head) ×1 IMPLANT
HOLDER FOLEY CATH W/STRAP (MISCELLANEOUS) ×1 IMPLANT
KIT PREVENA INCISION MGT 13 (CANNISTER) ×2 IMPLANT
LINER DMM HIGHCR 28MM (Liner) ×1 IMPLANT
MANIFOLD NEPTUNE II (INSTRUMENTS) ×2 IMPLANT
MASTERLOC HIP LATERAL S8 (Hips) ×1 IMPLANT
MAT ABSORB  FLUID 56X50 GRAY (MISCELLANEOUS) ×1
MAT ABSORB FLUID 56X50 GRAY (MISCELLANEOUS) ×1 IMPLANT
NDL SPNL 20GX3.5 QUINCKE YW (NEEDLE) ×2 IMPLANT
NEEDLE SPNL 20GX3.5 QUINCKE YW (NEEDLE) ×4 IMPLANT
NS IRRIG 1000ML POUR BTL (IV SOLUTION) ×2 IMPLANT
PACK HIP COMPR (MISCELLANEOUS) ×2 IMPLANT
SCALPEL PROTECTED #10 DISP (BLADE) ×4 IMPLANT
SHELL ACETABULAR DM  62MM (Shell) ×1 IMPLANT
STAPLER SKIN PROX 35W (STAPLE) ×2 IMPLANT
STRAP SAFETY 5IN WIDE (MISCELLANEOUS) ×2 IMPLANT
SUT DVC 2 QUILL PDO  T11 36X36 (SUTURE) ×1
SUT DVC 2 QUILL PDO T11 36X36 (SUTURE) ×1 IMPLANT
SUT SILK 0 (SUTURE) ×1
SUT SILK 0 30XBRD TIE 6 (SUTURE) ×1 IMPLANT
SUT V-LOC 90 ABS 3-0 VLT  V-20 (SUTURE) ×1
SUT V-LOC 90 ABS 3-0 VLT V-20 (SUTURE) IMPLANT
SUT V-LOC 90 ABS DVC 3-0 CL (SUTURE) ×1 IMPLANT
SUT VIC AB 1 CT1 36 (SUTURE) ×2 IMPLANT
SYR 20ML LL LF (SYRINGE) ×2 IMPLANT
SYR 30ML LL (SYRINGE) ×2 IMPLANT
SYR 50ML LL SCALE MARK (SYRINGE) ×4 IMPLANT
SYR BULB IRRIG 60ML STRL (SYRINGE) ×2 IMPLANT
TAPE MICROFOAM 4IN (TAPE) ×1 IMPLANT
TOWEL OR 17X26 4PK STRL BLUE (TOWEL DISPOSABLE) IMPLANT
TRAY FOLEY MTR SLVR 16FR STAT (SET/KITS/TRAYS/PACK) ×2 IMPLANT

## 2021-09-01 NOTE — Progress Notes (Deleted)
PHARMACIST - PHYSICIAN COMMUNICATION ? ?CONCERNING:  Enoxaparin (Lovenox) for DVT Prophylaxis  ? ?DESCRIPTION: ?Patient was prescribed enoxaprin 40mg  q24 hours for VTE prophylaxis.  ? Weights  ? 09/01/21 09/03/21  ?Weight: 99.8 kg (220 lb)  ? ? ?Body mass index is 32.96 kg/m?. ? ?Estimated Creatinine Clearance: 79.9 mL/min (by C-G formula based on SCr of 1.08 mg/dL). ? ? ?Based on [facility] policy patient is candidate for enoxaparin 0.5mg /kg TBW SQ every 24 hours based on BMI being >30. ? ? ?RECOMMENDATION: ?Pharmacy has adjusted enoxaparin dose per [facility] policy. ? ?Patient is now receiving enoxaparin 50 mg every 24 hours  ? ? ?[facility], PharmD ?Clinical Pharmacist  ?09/01/2021 ?2:55 PM ? ?

## 2021-09-01 NOTE — H&P (Signed)
Chief Complaint  ?Patient presents with  ? Pre-op Exam  ?H&P Right THA scheduled 09/01/21 with Dr. Rosita Kea  ? ? ?History of the Present Illness: ?Miguel Howell is a 66 y.o. male here today for history and physical for right total hip arthroplasty with Dr. Kennedy Bucker on 09/01/2021. Patient has had years of progressive right hip pain that has interfered with his ability to walk and perform normal activities of daily living. He has been walking with a cane but mostly sits due to his right hip pain. His pain is located in his groin, thigh, lateral hip and buttocks. Patient was scheduled for total joint replacement in 2022 but unfortunately his insurance was not accepted at our hospital, now that he switched insurances, both our clinic and hospital will accept his insurance now. Patient takes over-the-counter medications as needed for pain. ? ?Patient also admits to having an exposure to chlamydia several weeks ago, leukocytes was seen in his urine preoperatively. Will test for gonorrhea chlamydia and treat with doxycycline at this time. ? ?The patient has partial upper dentures with permanent bridge. ? ?The patient has never had blood clots. ? 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 ? ?I have reviewed past medical, surgical, social and family history, and allergies as documented in the EMR. ? ?Past Medical History: ?Past Medical History:  ?Diagnosis Date  ? Allergic state  ?environmental allergies  ? Asthma without status asthmaticus, unspecified  ? Bronchitis  ? Cardiomyopathy (CMS-HCC)  ?diuretic cardiomyopathy followed at [facility]  ? COPD (chronic obstructive pulmonary disease) (CMS-HCC)  ? GERD (gastroesophageal reflux disease)  ? Hepatitis B  ? Hyperlipidemia  ? Hypertension  ?benign  ? Nephrolithiasis  ? Syphilis 1995  ? Tobacco abuse  ? ?Past Surgical History: ?Past Surgical History:  ?Procedure Laterality Date  ? HERNIA REPAIR Left  ?inguinal hernia repair  ? ?Past Family History: ?Family  History  ?Problem Relation Age of Onset  ? Diabetes type II Mother  ? Heart failure Father  ? High blood pressure (Hypertension) Father  ? Diabetes type II Father  ? Coronary Artery Disease (Blocked arteries around heart) Other  ? ?Medications: ?Current Outpatient Medications Ordered in Epic  ?Medication Sig Dispense Refill  ? albuterol 90 mcg/actuation inhaler TAKE 2 PUFFS EVERY 4 HOURS AS NEEDED FOR SHORTNESS OF BREATH 8.5 each 1  ? aspirin 81 MG EC tablet 1 tab by mouth daily 11  ? carvediloL (COREG) 6.25 MG tablet TAKE 1 TABLET (6.25 MG TOTAL) BY MOUTH 2 (TWO) TIMES DAILY. 180 tablet 3  ? diazePAM (VALIUM) 10 MG tablet TAKE 1 TABLET BY MOUTH NIGHTLY AS NEEDED FOR ANXIETY. 30 tablet 0  ? doxycycline (VIBRA-TABS) 100 MG tablet Take 1 tablet (100 mg total) by mouth 2 (two) times daily for 7 days 14 tablet 0  ? fluticasone propion-salmeteroL (ADVAIR HFA) 230-21 mcg/actuation inhaler Inhale 2 inhalations into the lungs every 12 (twelve) hours 8 g 12  ? FUROsemide (LASIX) 40 MG tablet TAKE 1 TABLET BY MOUTH TWICE A DAY 180 tablet 1  ? gabapentin (NEURONTIN) 300 MG capsule Take 1 capsule (300 mg total) by mouth nightly 90 capsule 3  ? KLOR-CON M10 10 mEq ER tablet TAKE 1 TABLET BY MOUTH 2 TIMES DAILY. 180 tablet 1  ? montelukast (SINGULAIR) 10 mg tablet TAKE 1 TABLET BY MOUTH EVERY DAY 90 tablet 0  ? nabumetone (RELAFEN) 500 MG tablet TAKE 1 TABLET BY MOUTH TWICE A DAY 60 tablet 5  ? SPIRIVA RESPIMAT 2.5  mcg/actuation inhalation spray INHALE 2 INHALATIONS (5 MCG TOTAL) INTO THE LUNGS ONCE DAILY 4 g 11  ? tadalafiL (CIALIS) 20 MG tablet Take 1 tablet (20 mg total) by mouth every third day as needed for Erectile Dysfunction for up to 30 days 30 tablet 5  ? ?No current Epic-ordered facility-administered medications on file.  ? ?Allergies: ?No Known Allergies  ? ?Body mass index is 32.96 kg/m?. ? ?Review of Systems: ?A comprehensive 14 point ROS was performed, reviewed, and the pertinent orthopaedic findings are documented  in the HPI. ? ?Vitals:  ?08/26/21 0942  ?BP: (!) 178/64  ? ? ?General Physical Examination:  ?General:  ?Well developed, well nourished, no apparent distress, normal affect, slow fairly slow antalgic gait with a cane. ? ?HEENT: ?Head normocephalic, atraumatic, PERRL.  ? ?Abdomen: ?Soft, non tender, non distended, Bowel sounds present. ? ?Heart: ?Examination of the heart reveals regular, rate, and rhythm. There is no murmur noted on ascultation. There is a normal apical pulse. ? ?Lungs: ?Lungs are clear to auscultation. There is no wheeze, rhonchi, or crackles. There is normal expansion of bilateral chest walls.  ?Musculoskeletal Examination: ? ?Right hip: ?On exam, right leg is 0.5 inch shorter than the left. Right hip has 0 degrees internal rotation and 10 degrees external. Slight right hip flexion contracture. No edema in the right lower extremity ? ?Radiographs: ?AP pelvis and lateral x-rays of the right hip were viewed by me today. These show some collapse of the femoral head with appearance of some avascular necrosis, as well as severe osteoarthritis, extensive osteophytes. Lateral view shows collapse of head with essentially a flattened femoral head with some loss of superior acetabulum, and on the AP pelvis, it appears there definitely is some bone loss to the acetabulum. ? 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 ? ?Assessment: ?ICD-10-CM  ?1. Primary osteoarthritis of right hip M16.11  ?2. Possible exposure to STD Z20.2  ? ?Plan: ?52. 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 He has complete loss of joint space with deformity of the femoral head and acetabulum. Pain severely limiting his ability to perform normal activities of daily living. Pain is severe. Risks, benefits, complications of a right total hip arthroplasty have been discussed with the patient patient has agreed and consented procedure with Dr. Kennedy Bucker on 09/01/2021. ?2. Patient with exposure to chlamydia several weeks ago. We will go ahead and treat with doxycycline but will also order a gonorrhea chlamydia test to check for gonorrhea. If positive for gonorrhea will need ceftriaxone IM ? ?Surgical Risks: ? ?The nature of the condition and the proposed procedure has been reviewed in detail with the patient. Surgical versus non-surgical options and prognosis for recovery have been reviewed and the inherent risks and benefits of each have been discussed including the risks of infection, bleeding, injury to nerves/blood vessels/tendons, incomplete relief of symptoms, persisting pain and/or stiffness, loss of function, complex regional pain syndrome, failure of the procedure, as appropriate. ? ?Electronically signed by Patience Musca, PA at 08/26/2021 12:08 PM EDT ? ?Reviewed  H+P. ?No changes noted. ? ? ?

## 2021-09-01 NOTE — Op Note (Signed)
09/01/2021 ? ?12:06 PM ? ?PATIENT:  Miguel Howell  66 y.o. male ? ?PRE-OPERATIVE DIAGNOSIS:  Primary osteoarthritis of right hip  M16.11 ? ?POST-OPERATIVE DIAGNOSIS:  Primary osteoarthritis of right hip  M16.11 ? ?PROCEDURE:  Procedure(s): ?TOTAL HIP ARTHROPLASTY ANTERIOR APPROACH (Right) ? ?SURGEON: Leitha Schuller, MD ? ?ASSISTANTS: None ? ?ANESTHESIA:   spinal ? ?EBL:  Total I/O ?In: 800 [I.V.:600; IV Piggyback:200] ?Out: 500 [Urine:250; Blood:250] ? ?BLOOD ADMINISTERED:none ? ?DRAINS:  Incisional wound VAC   ? ?LOCAL MEDICATIONS USED:  MARCAINE    and OTHER Exparel ? ?SPECIMEN:  Source of Specimen:    Right femoral head ? ?DISPOSITION OF SPECIMEN:  PATHOLOGY ? ?COUNTS:  YES ? ?TOURNIQUET:  * No tourniquets in log * ? ?IMPLANTS: Medacta Masterloc 8 LAT stem with metal L 28 mm head, 62 mm Mpact TM cup and liner ? ?DICTATION: .Dragon Dictation   The patient was brought to the operating room and after spinal anesthesia was obtained patient was placed on the operative table with the ipsilateral foot into the Medacta attachment, contralateral leg on a well-padded table. C-arm was brought in and preop template x-ray taken. After prepping and draping in usual sterile fashion appropriate patient identification and timeout procedures were completed. Anterior approach to the hip was obtained and centered over the greater trochanter and TFL muscle. The subcutaneous tissue was incised hemostasis being achieved by electrocautery. TFL fascia was incised and the muscle retracted laterally deep retractor placed. The lateral femoral circumflex vessels were identified and ligated. The anterior capsule was exposed and a capsulotomy performed. The neck was identified and a femoral neck cut carried out with a saw. The head was removed without difficulty and showed sclerotic femoral head and acetabulum. Reaming was carried out to 60 mm and a 62 mm cup trial gave appropriate tightness to the acetabular component a 62 DM cup was impacted  into position. The leg was then externally rotated and ischiofemoral and pubofemoral releases carried out. The femur was sequentially broached to a size 8, size 8 LAT with S head trials were placed and the final components chosen. The 8 LAT stem was inserted along with a metal L 28 mm head and 62 mm liner. The hip was reduced and was stable the wound was thoroughly irrigated with fibrillar placed along the posterior capsule and medial neck. The deep fascia ws closed using a heavy Quill after infiltration of 30 cc of quarter percent Sensorcaine with epinephrine. diluted with Exparel throughout the case, 3-0 V-loc to close the skin with skin staples.  Incisional wound VAC applied and patient was sent to recovery in stable condition.  ? ?PLAN OF CARE: Admit for overnight observation ? ?

## 2021-09-01 NOTE — Transfer of Care (Signed)
Immediate Anesthesia Transfer of Care Note ? ?Patient: Miguel Howell ? ?Procedure(s) Performed: TOTAL HIP ARTHROPLASTY ANTERIOR APPROACH (Right: Hip) ? ?Patient Location: PACU ? ?Anesthesia Type:Spinal ? ?Level of Consciousness: awake and drowsy ? ?Airway & Oxygen Therapy: Patient Spontanous Breathing and Patient connected to face mask oxygen ? ?Post-op Assessment: Report given to RN and Post -op Vital signs reviewed and stable ? ?Post vital signs: Reviewed and stable ? ?Last Vitals:  ?Vitals Value Taken Time  ?BP 105/46 09/01/21 1210  ?Temp 36.4 ?C 09/01/21 1210  ?Pulse 59 09/01/21 1214  ?Resp 25 09/01/21 1214  ?SpO2 100 % 09/01/21 1214  ? ? ?Last Pain:  ?Vitals:  ? 09/01/21 0822  ?TempSrc: Oral  ?PainSc: 7   ?   ? ?  ? ?Complications: No notable events documented. ?

## 2021-09-01 NOTE — Anesthesia Preprocedure Evaluation (Signed)
Anesthesia Evaluation  ?Patient identified by MRN, date of birth, ID band ?Patient awake ? ? ? ?Reviewed: ?Allergy & Precautions, H&P , NPO status , Patient's Chart, lab work & pertinent test results, reviewed documented beta blocker date and time  ? ?Airway ?Mallampati: II ? ? ?Neck ROM: full ? ? ? Dental ? ?(+) Poor Dentition ?  ?Pulmonary ?shortness of breath and with exertion, asthma , pneumonia, COPD,  COPD inhaler, Current Smoker,  ?  ?Pulmonary exam normal ? ? ? ? ? ? ? Cardiovascular ?Exercise Tolerance: Poor ?hypertension, On Medications ?+CHF  ?Normal cardiovascular exam ?Rhythm:regular Rate:Normal ? ? ?  ?Neuro/Psych ?negative neurological ROS ? negative psych ROS  ? GI/Hepatic ?negative GI ROS, Neg liver ROS,   ?Endo/Other  ?negative endocrine ROS ? Renal/GU ?negative Renal ROS  ?negative genitourinary ?  ?Musculoskeletal ? ? Abdominal ?  ?Peds ? Hematology ?negative hematology ROS ?(+)   ?Anesthesia Other Findings ?Past Medical History: ?No date: Arthritis ?    Comment:  bilateral hips and back ?No date: Asthma ?No date: CHF (congestive heart failure) (HCC) ?No date: COPD (chronic obstructive pulmonary disease) (HCC) ?No date: Dyspnea ?No date: Hypertension ?Past Surgical History: ?No date: HERNIA REPAIR ?BMI   ? Body Mass Index: 32.96 kg/m?  ?  ? Reproductive/Obstetrics ?negative OB ROS ? ?  ? ? ? ? ? ? ? ? ? ? ? ? ? ?  ?  ? ? ? ? ? ? ? ? ?Anesthesia Physical ?Anesthesia Plan ? ?ASA: 3 ? ?Anesthesia Plan: Spinal  ? ?Post-op Pain Management:   ? ?Induction:  ? ?PONV Risk Score and Plan: 1 ? ?Airway Management Planned:  ? ?Additional Equipment:  ? ?Intra-op Plan:  ? ?Post-operative Plan:  ? ?Informed Consent: I have reviewed the patients History and Physical, chart, labs and discussed the procedure including the risks, benefits and alternatives for the proposed anesthesia with the patient or authorized representative who has indicated his/her understanding and  acceptance.  ? ? ? ?Dental Advisory Given ? ?Plan Discussed with: CRNA ? ?Anesthesia Plan Comments:   ? ? ? ? ? ? ?Anesthesia Quick Evaluation ? ?

## 2021-09-01 NOTE — Evaluation (Signed)
Physical Therapy Evaluation ?Patient Details ?Name: Miguel Howell ?MRN: [id number] ?DOB: 22-Jul-1955 ?Today's Date: 09/01/2021 ? ?History of Present Illness ? Pt admitted for R THR, POD 0 at time of eval.  ?Clinical Impression ? Pt is a pleasant 66 year old male who was admitted for R THR. Pt performs bed mobility with cga and transfers/ambulation with min assist and RW. Pt shows great effort and very pleasant. Slight pain with movement. Does have episode of knee buckle, able to self correct. Sensation not fully intact, still with numbness noted distal to knee. Pt demonstrates deficits with strength/mobility/pain. Would benefit from skilled PT to address above deficits and promote optimal return to PLOF. Recommend transition to HHPT upon discharge from acute hospitalization. ? ?   ? ?Recommendations for follow up therapy are one component of a multi-disciplinary discharge planning process, led by the attending physician.  Recommendations may be updated based on patient status, additional functional criteria and insurance authorization. ? ?Follow Up Recommendations Home health PT ? ?  ?Assistance Recommended at Discharge Intermittent Supervision/Assistance  ?Patient can return home with the following ? A little help with walking and/or transfers;A little help with bathing/dressing/bathroom;Assist for transportation;Help with stairs or ramp for entrance ? ?  ?Equipment Recommendations Rolling walker (2 wheels)  ?Recommendations for Other Services ?    ?  ?Functional Status Assessment Patient has had a recent decline in their functional status and demonstrates the ability to make significant improvements in function in a reasonable and predictable amount of time.  ? ?  ?Precautions / Restrictions Precautions ?Precautions: Fall;Anterior Hip ?Precaution Booklet Issued: No ?Restrictions ?Weight Bearing Restrictions: Yes ?RLE Weight Bearing: Weight bearing as tolerated  ? ?  ? ?Mobility ? Bed Mobility ?Overal bed mobility:  Needs Assistance ?Bed Mobility: Supine to Sit ?  ?  ?Supine to sit: Min guard ?  ?  ?General bed mobility comments: Cues for sequencing. Once seated at EOB, upright posture noted ?  ? ?Transfers ?Overall transfer level: Needs assistance ?Equipment used: Rolling walker (2 wheels) ?Transfers: Bed to chair/wheelchair/BSC ?  ?  ?Step pivot transfers: Min assist ?  ?  ?  ?General transfer comment: cues for hand placement prior to standing. ONce standing, able to transfer to recliner with RW ?  ? ?Ambulation/Gait ?Ambulation/Gait assistance: Min assist ?Gait Distance (Feet): 3 Feet ?Assistive device: Rolling walker (2 wheels) ?Gait Pattern/deviations: Step-to pattern ?  ?  ?  ?General Gait Details: slight buckling noted in surgical LE during stepping with patient able to use B UE for assistance ? ?Stairs ?  ?  ?  ?  ?  ? ?Wheelchair Mobility ?  ? ?Modified Rankin (Stroke Patients Only) ?  ? ?  ? ?Balance Overall balance assessment: Needs assistance ?Sitting-balance support: Feet supported ?Sitting balance-Leahy Scale: Good ?  ?  ?Standing balance support: Bilateral upper extremity supported ?Standing balance-Leahy Scale: Good ?  ?  ?  ?  ?  ?  ?  ?  ?  ?  ?  ?  ?   ? ? ? ?Pertinent Vitals/Pain Pain Assessment ?Pain Assessment: No/denies pain  ? ? ?Home Living Family/patient expects to be discharged to:: Private residence ?Living Arrangements: Alone ?Available Help at Discharge: Friend(s);Available PRN/intermittently (friend in room stated he could stay 24/7 for a few days) ?Type of Home: House ?Home Access: Stairs to enter ?Entrance Stairs-Rails: None ?Entrance Stairs-Number of Steps: 1 ?  ?Home Layout: One level ?Home Equipment: Gilmer Mor - single point;Toilet riser ?   ?  ?  Prior Function Prior Level of Function : Independent/Modified Independent ?  ?  ?  ?  ?  ?  ?Mobility Comments: was previously using SPC, reports no history of falls ?  ?  ? ? ?Hand Dominance  ?   ? ?  ?Extremity/Trunk Assessment  ? Upper Extremity  Assessment ?Upper Extremity Assessment: Overall WFL for tasks assessed ?  ? ?Lower Extremity Assessment ?Lower Extremity Assessment: Generalized weakness (R LE grossly 3+/5) ?  ? ?   ?Communication  ? Communication: No difficulties  ?Cognition Arousal/Alertness: Awake/alert ?Behavior During Therapy: [facility] for tasks assessed/performed ?Overall Cognitive Status: Within Functional Limits for tasks assessed ?  ?  ?  ?  ?  ?  ?  ?  ?  ?  ?  ?  ?  ?  ?  ?  ?  ?  ?  ? ?  ?General Comments   ? ?  ?Exercises Other Exercises ?Other Exercises: supine ther-ex performed on R LE including AP, quad sets, glut sets, SAQ, and hip abd/add. 10 reps with min assist  ? ?Assessment/Plan  ?  ?PT Assessment Patient needs continued PT services  ?PT Problem List Decreased strength;Decreased balance;Decreased mobility;Pain ? ?   ?  ?PT Treatment Interventions Gait training;Therapeutic exercise;Balance training;DME instruction   ? ?PT Goals (Current goals can be found in the Care Plan section)  ?Acute Rehab PT Goals ?Patient Stated Goal: to go home ?PT Goal Formulation: With patient ?Time For Goal Achievement: 09/15/21 ?Potential to Achieve Goals: Good ? ?  ?Frequency BID ?  ? ? ?Co-evaluation   ?  ?  ?  ?  ? ? ?  ?AM-PAC PT "6 Clicks" Mobility  ?Outcome Measure Help needed turning from your back to your side while in a flat bed without using bedrails?: A Little ?Help needed moving from lying on your back to sitting on the side of a flat bed without using bedrails?: A Little ?Help needed moving to and from a bed to a chair (including a wheelchair)?: A Little ?Help needed standing up from a chair using your arms (e.g., wheelchair or bedside chair)?: A Little ?Help needed to walk in hospital room?: A Lot ?Help needed climbing 3-5 steps with a railing? : A Lot ?6 Click Score: 16 ? ?  ?End of Session Equipment Utilized During Treatment: Gait belt ?Activity Tolerance: Patient tolerated treatment well ?Patient left: in chair (no chair alarm on unit,  CNA aware) ?Nurse Communication: Mobility status ?PT Visit Diagnosis: Muscle weakness (generalized) (M62.81);Difficulty in walking, not elsewhere classified (R26.2);Pain ?Pain - Right/Left: Right ?Pain - part of body: Hip ?  ? ?Time: 7106-2694 ?PT Time Calculation (min) (ACUTE ONLY): 28 min ? ? ?Charges:   PT Evaluation ?$PT Eval Low Complexity: 1 Low ?PT Treatments ?$Therapeutic Exercise: 8-22 mins ?  ?   ? ? ?Elizabeth Palau, PT, DPT, GCS ?[phone] ? ? ?Carrolyn Hilmes ?09/01/2021, 4:38 PM ? ?

## 2021-09-01 NOTE — TOC Progression Note (Signed)
Transition of Care (TOC) - Progression Note  ? ? ?Patient Details  ?Name: Miguel Howell ?MRN: [id number] ?Date of Birth: 1955-08-01 ? ?Transition of Care (TOC) CM/SW Contact  ?Marlowe Sax, RN ?Phone Number: ?09/01/2021, 4:29 PM ? ?Clinical Narrative:   Patient set up with [facility] for [facility] prior to [facility] office, TOC to follow and assist with DC planning and needs ? ? ? ?  ?  ? ?Expected Discharge Plan and Services ?  ?  ?  ?  ?  ?                ?  ?  ?  ?  ?  ?  ?  ?  ?  ?  ? ? ?Social Determinants of Health (SDOH) Interventions ?  ? ?Readmission Risk Interventions ?   ? View : No data to display.  ?  ?  ?  ? ? ?

## 2021-09-01 NOTE — Anesthesia Procedure Notes (Signed)
Spinal ? ?Patient location during procedure: OR ?Start time: 09/01/2021 10:36 AM ?End time: 09/01/2021 10:42 AM ?Reason for block: surgical anesthesia ?Staffing ?Performed: resident/CRNA  ?Anesthesiologist: Yevette Edwards, MD ?Resident/CRNA: Reece Agar, CRNA ?Preanesthetic Checklist ?Completed: patient identified, IV checked, site marked, risks and benefits discussed, surgical consent, monitors and equipment checked, pre-op evaluation and timeout performed ?Spinal Block ?Patient position: sitting ?Prep: ChloraPrep and DuraPrep ?Patient monitoring: heart rate, continuous pulse ox and blood pressure ?Approach: midline ?Location: L3-4 ?Injection technique: single-shot ?Needle ?Needle type: Pencan  ?Needle gauge: 24 G ?Needle length: 9 cm ?Assessment ?Sensory level: T4 ?Events: CSF return ? ? ? ?

## 2021-09-02 ENCOUNTER — Encounter: Payer: Self-pay | Admitting: Orthopedic Surgery

## 2021-09-02 DIAGNOSIS — M1611 Unilateral primary osteoarthritis, right hip: Secondary | ICD-10-CM | POA: Diagnosis not present

## 2021-09-02 LAB — BASIC METABOLIC PANEL
Anion gap: 5 (ref 5–15)
BUN: 15 mg/dL (ref 8–23)
CO2: 31 mmol/L (ref 22–32)
Calcium: 8.8 mg/dL — ABNORMAL LOW (ref 8.9–10.3)
Chloride: 97 mmol/L — ABNORMAL LOW (ref 98–111)
Creatinine, Ser: 1.03 mg/dL (ref 0.61–1.24)
GFR, Estimated: >60 mL/min (ref >60)
Glucose, Bld: 123 mg/dL — ABNORMAL HIGH (ref 70–99)
Potassium: 3.7 mmol/L (ref 3.5–5.1)
Sodium: 133 mmol/L — ABNORMAL LOW (ref 135–145)

## 2021-09-02 LAB — CBC
HCT: 41.6 % (ref 39.0–52.0)
Hemoglobin: 13.7 g/dL (ref 13.0–17.0)
MCH: 33.1 pg (ref 26.0–34.0)
MCHC: 32.9 g/dL (ref 30.0–36.0)
MCV: 100.5 fL — ABNORMAL HIGH (ref 80.0–100.0)
Platelets: 155 10*3/uL (ref 150–400)
RBC: 4.14 MIL/uL — ABNORMAL LOW (ref 4.22–5.81)
RDW: 12.5 % (ref 11.5–15.5)
WBC: 6.5 10*3/uL (ref 4.0–10.5)
nRBC: 0 % (ref 0.0–0.2)

## 2021-09-02 MED ORDER — OXYCODONE HCL 5 MG PO TABS: 5.0000 mg | ORAL_TABLET | ORAL | 0 refills | Status: DC | PRN

## 2021-09-02 MED ORDER — TRAMADOL HCL 50 MG PO TABS: 50.0000 mg | ORAL_TABLET | Freq: Four times a day (QID) | ORAL | 0 refills | Status: DC | PRN

## 2021-09-02 MED ORDER — METHOCARBAMOL 500 MG PO TABS: 500.0000 mg | ORAL_TABLET | Freq: Four times a day (QID) | ORAL | 0 refills | Status: DC | PRN

## 2021-09-02 MED ORDER — ENOXAPARIN SODIUM 40 MG/0.4ML IJ SOSY: 40.0000 mg | PREFILLED_SYRINGE | INTRAMUSCULAR | 0 refills | Status: DC

## 2021-09-02 MED ORDER — DOCUSATE SODIUM 100 MG PO CAPS
100.0000 mg | ORAL_CAPSULE | Freq: Two times a day (BID) | ORAL | 0 refills | Status: AC
Start: 2021-09-02 — End: ?

## 2021-09-02 NOTE — Progress Notes (Signed)
Physical Therapy Treatment ?Patient Details ?Name: Miguel Howell ?MRN: [id number] ?DOB: 1956-03-23 ?Today's Date: 09/02/2021 ? ? ?History of Present Illness Pt admitted for R THR, POD 0 at time of eval. ? ?  ?PT Comments  ? ? Physical Therapy session completed this date. Patient tolerated session well and was agreeable to treatment. Patient reported 7/10 pain at beginning of session that did not increase throughout session. Supine/EOB activities were completed to improve BLE strengthening and ROM. HEP packet reviewed. Patient demonstrated increased independence with bed mobility at supervision. Transfers and ambulation to recliner completed at CGA/SBA. Patient left in recliner with all needs met. Patient is progressing towards his goals and would continue to benefit from skilled physical therapy in order to optimize patient's PLOF. Continue to recommend HHPT upon discharge from acute hospitalization.  ?  ?Recommendations for follow up therapy are one component of a multi-disciplinary discharge planning process, led by the attending physician.  Recommendations may be updated based on patient status, additional functional criteria and insurance authorization. ? ?Follow Up Recommendations ? Home health PT ?  ?  ?Assistance Recommended at Discharge Intermittent Supervision/Assistance  ?Patient can return home with the following A little help with walking and/or transfers;A little help with bathing/dressing/bathroom;Assist for transportation;Help with stairs or ramp for entrance ?  ?Equipment Recommendations ? Rolling walker (2 wheels)  ?  ?Recommendations for Other Services   ? ? ?  ?Precautions / Restrictions Precautions ?Precautions: Fall;Anterior Hip ?Precaution Booklet Issued: Yes (comment) ?Restrictions ?Weight Bearing Restrictions: Yes ?RLE Weight Bearing: Weight bearing as tolerated  ?  ? ?Mobility ? Bed Mobility ?Overal bed mobility: Needs Assistance ?Bed Mobility: Supine to Sit ?  ?  ?Supine to sit: Supervision ?   ?  ?General bed mobility comments: Increased time and effort, no physical assistance needed, Increased use of bed rails ?Patient Response: Cooperative ? ?Transfers ?Overall transfer level: Needs assistance ?Equipment used: Rolling walker (2 wheels) ?Transfers: Sit to/from Stand ?Sit to Stand: Min guard ?  ?  ?  ?  ?  ?General transfer comment: cues for hand placement prior to standing. ONce standing, able to transfer to recliner with RW ?  ? ?Ambulation/Gait ?Ambulation/Gait assistance: Min guard (SBA) ?Gait Distance (Feet): 5 Feet ?Assistive device: Rolling walker (2 wheels) ?Gait Pattern/deviations: Step-through pattern, Decreased step length - right, Decreased step length - left, Decreased stride length, Narrow base of support ?Gait velocity: decreased ?  ?  ?  ? ? ?Stairs ?  ?  ?  ?  ?  ? ? ?Wheelchair Mobility ?  ? ?Modified Rankin (Stroke Patients Only) ?  ? ? ?  ?Balance Overall balance assessment: Needs assistance ?Sitting-balance support: Feet supported ?Sitting balance-Leahy Scale: Good ?  ?  ?Standing balance support: Bilateral upper extremity supported ?Standing balance-Leahy Scale: Good ?  ?  ?  ?  ?  ?  ?  ?  ?  ?  ?  ?  ?  ? ?  ?Cognition Arousal/Alertness: Awake/alert ?Behavior During Therapy: [facility] for tasks assessed/performed ?Overall Cognitive Status: Within Functional Limits for tasks assessed ?  ?  ?  ?  ?  ?  ?  ?  ?  ?  ?  ?  ?  ?  ?  ?  ?  ?  ?  ? ?  ?Exercises Total Joint Exercises ?Ankle Circles/Pumps: Supine, AROM, 20 reps, Both ?Quad Sets: Supine, 15 reps, AROM, Both, Strengthening ?Gluteal Sets: Supine, AROM, 15 reps, Both, Strengthening ?Short Arc Quad: Supine, 10 reps,  AROM, Both, Strengthening ?Heel Slides: Supine, AROM, 10 reps, Both ?Hip ABduction/ADduction: Supine, AROM, 10 reps, Both, Strengthening ?Straight Leg Raises: Supine, AAROM, 10 reps, Both, Strengthening ?Long Arc Quad: Seated, AROM, 10 reps, Both, Strengthening ? ?  ?General Comments   ?  ?  ? ?Pertinent Vitals/Pain Pain  Assessment ?Pain Assessment: 0-10 ?Pain Score: 7  ?Pain Location: R hip ?Pain Descriptors / Indicators: Aching, Discomfort, Dull ?Pain Intervention(s): Monitored during session, Repositioned  ? ? ?Home Living   ?  ?  ?  ?  ?  ?  ?  ?  ?  ?   ?  ?Prior Function    ?  ?  ?   ? ?PT Goals (current goals can now be found in the care plan section) Acute Rehab PT Goals ?Patient Stated Goal: to go home ?PT Goal Formulation: With patient ?Time For Goal Achievement: 09/15/21 ?Potential to Achieve Goals: Good ?Additional Goals ?Additional Goal #1: Pt will be able to perform bed mobility/transfers with mod I and safe technique in order to improve functional independence ?Progress towards PT goals: Progressing toward goals ? ?  ?Frequency ? ? ? BID ? ? ? ?  ?PT Plan Current plan remains appropriate  ? ? ?Co-evaluation   ?  ?  ?  ?  ? ?  ?AM-PAC PT "6 Clicks" Mobility   ?Outcome Measure ? Help needed turning from your back to your side while in a flat bed without using bedrails?: A Little ?Help needed moving from lying on your back to sitting on the side of a flat bed without using bedrails?: A Little ?Help needed moving to and from a bed to a chair (including a wheelchair)?: A Little ?Help needed standing up from a chair using your arms (e.g., wheelchair or bedside chair)?: A Little ?Help needed to walk in hospital room?: A Little ?Help needed climbing 3-5 steps with a railing? : A Lot ?6 Click Score: 17 ? ?  ?End of Session Equipment Utilized During Treatment: Gait belt ?Activity Tolerance: Patient tolerated treatment well ?Patient left: in chair;with call bell/phone within reach ?Nurse Communication: Mobility status ?PT Visit Diagnosis: Muscle weakness (generalized) (M62.81);Difficulty in walking, not elsewhere classified (R26.2);Pain ?Pain - Right/Left: Right ?Pain - part of body: Hip ?  ? ? ?Time: 7711-6579 ?PT Time Calculation (min) (ACUTE ONLY): 29 min ? ?Charges:  $Therapeutic Exercise: 23-37 mins          ?           ? ?Iva Boop, PT  ?09/02/21. 9:54 AM ? ? ?

## 2021-09-02 NOTE — TOC Progression Note (Signed)
Transition of Care (TOC) - Progression Note  ? ? ?Patient Details  ?Name: Miguel Howell ?MRN: [id number] ?Date of Birth: 03-12-1956 ? ?Transition of Care (TOC) CM/SW Contact  ?Margarito Liner, LCSW ?Phone Number: ?09/02/2021, 2:05 PM ? ?Clinical Narrative:  Ordered RW and shower stool. Patient is aware shower stool will be out of pocket.  ? ?Expected Discharge Plan and Services ?  ?  ?  ?  ?  ?                ?  ?  ?  ?  ?  ?  ?  ?  ?  ?  ? ? ?Social Determinants of Health (SDOH) Interventions ?  ? ?Readmission Risk Interventions ?   ? View : No data to display.  ?  ?  ?  ? ? ?

## 2021-09-02 NOTE — Discharge Instructions (Signed)
?ANTERIOR APPROACH TOTAL HIP REPLACEMENT POSTOPERATIVE DIRECTIONS ? ? ?Hip Rehabilitation, Guidelines Following Surgery  ?The results of a hip operation are greatly improved after range of motion and muscle strengthening exercises. Follow all safety measures which are given to protect your hip. If any of these exercises cause increased pain or swelling in your joint, decrease the amount until you are comfortable again. Then slowly increase the exercises. Call your caregiver if you have problems or questions.  ? ?HOME CARE INSTRUCTIONS  ?Remove items at home which could result in a fall. This includes throw rugs or furniture in walking pathways.  ?ICE to the affected hip every three hours for 30 minutes at a time and then as needed for pain and swelling.  Continue to use ice on the hip for pain and swelling from surgery. You may notice swelling that will progress down to the foot and ankle.  This is normal after surgery.  Elevate the leg when you are not up walking on it.   ?Continue to use the breathing machine which will help keep your temperature down.  It is common for your temperature to cycle up and down following surgery, especially at night when you are not up moving around and exerting yourself.  The breathing machine keeps your lungs expanded and your temperature down. ?Do not place pillow under knee, focus on keeping the knee straight while resting ? ?DIET ?You may resume your previous home diet once your are discharged from the hospital. ? ?DRESSING / WOUND CARE / SHOWERING ?Please remove provena negative pressure dressing on 09/10/2021 and apply honey comb dressing. Keep dressing clean and dry at all times.  ? ?ACTIVITY ?Walk with your walker as instructed. ?Use walker as long as suggested by your caregivers. ?Avoid periods of inactivity such as sitting longer than an hour when not asleep. This helps prevent blood clots.  ?You may resume a sexual relationship in one month or when given the OK by your  doctor.  ?You may return to work once you are cleared by your doctor.  ?Do not drive a car for 6 weeks or until released by you surgeon.  ?Do not drive while taking narcotics. ? ?WEIGHT BEARING ?Weight bearing as tolerated. Use walker/cane as needed for at least 4 weeks post op. ? ?POSTOPERATIVE CONSTIPATION PROTOCOL ?Constipation - defined medically as fewer than three stools per week and severe constipation as less than one stool per week. ? ?One of the most common issues patients have following surgery is constipation.  Even if you have a regular bowel pattern at home, your normal regimen is likely to be disrupted due to multiple reasons following surgery.  Combination of anesthesia, postoperative narcotics, change in appetite and fluid intake all can affect your bowels.  In order to avoid complications following surgery, here are some recommendations in order to help you during your recovery period. ? ?Colace (docusate) - Pick up an over-the-counter form of Colace or another stool softener and take twice a day as long as you are requiring postoperative pain medications.  Take with a full glass of water daily.  If you experience loose stools or diarrhea, hold the colace until you stool forms back up.  If your symptoms do not get better within 1 week or if they get worse, check with your doctor. ? ?Dulcolax (bisacodyl) - Pick up over-the-counter and take as directed by the product packaging as needed to assist with the movement of your bowels.  Take with a full glass of  water.  Use this product as needed if not relieved by Colace only.  ? ?MiraLax (polyethylene glycol) - Pick up over-the-counter to have on hand.  MiraLax is a solution that will increase the amount of water in your bowels to assist with bowel movements.  Take as directed and can mix with a glass of water, juice, soda, coffee, or tea.  Take if you go more than two days without a movement. ?Do not use MiraLax more than once per day. Call your doctor  if you are still constipated or irregular after using this medication for 7 days in a row. ? ?If you continue to have problems with postoperative constipation, please contact the office for further assistance and recommendations.  If you experience "the worst abdominal pain ever" or develop nausea or vomiting, please contact the office immediatly for further recommendations for treatment. ? ?ITCHING ? If you experience itching with your medications, try taking only a single pain pill, or even half a pain pill at a time.  You can also use Benadryl over the counter for itching or also to help with sleep.  ? ?TED HOSE STOCKINGS ?Wear the elastic stockings on both legs for six weeks following surgery during the day but you may remove then at night for sleeping. ? ?MEDICATIONS ?See your medication summary on the ?After Visit Summary? that the nursing staff will review with you prior to discharge.  You may have some home medications which will be placed on hold until you complete the course of blood thinner medication.  It is important for you to complete the blood thinner medication as prescribed by your surgeon.  Continue your approved medications as instructed at time of discharge. ? ?PRECAUTIONS ?If you experience chest pain or shortness of breath - call 911 immediately for transfer to the hospital emergency department.  ?If you develop a fever greater that 101 F, purulent drainage from wound, increased redness or drainage from wound, foul odor from the wound/dressing, or calf pain - CONTACT YOUR SURGEON.   ?                                                ?FOLLOW-UP APPOINTMENTS ?Make sure you keep all of your appointments after your operation with your surgeon and caregivers. You should call the office at the above phone number and make an appointment for approximately two weeks after the date of your surgery or on the date instructed by your surgeon outlined in the "After Visit Summary". ? ?RANGE OF MOTION AND  STRENGTHENING EXERCISES  ?These exercises are designed to help you keep full movement of your hip joint. Follow your caregiver's or physical therapist's instructions. Perform all exercises about fifteen times, three times per day or as directed. Exercise both hips, even if you have had only one joint replacement. These exercises can be done on a training (exercise) mat, on the floor, on a table or on a bed. Use whatever works the best and is most comfortable for you. Use music or television while you are exercising so that the exercises are a pleasant break in your day. This will make your life better with the exercises acting as a break in routine you can look forward to.  ?Lying on your back, slowly slide your foot toward your buttocks, raising your knee up off the floor. Then slowly slide your  foot back down until your leg is straight again.  ?Lying on your back spread your legs as far apart as you can without causing discomfort.  ?Lying on your side, raise your upper leg and foot straight up from the floor as far as is comfortable. Slowly lower the leg and repeat.  ?Lying on your back, tighten up the muscle in the front of your thigh (quadriceps muscles). You can do this by keeping your leg straight and trying to raise your heel off the floor. This helps strengthen the largest muscle supporting your knee.  ?Lying on your back, tighten up the muscles of your buttocks both with the legs straight and with the knee bent at a comfortable angle while keeping your heel on the floor.  ? ?IF YOU ARE TRANSFERRED TO A SKILLED REHAB FACILITY ?If the patient is transferred to a skilled rehab facility following release from the hospital, a list of the current medications will be sent to the facility for the patient to continue.  When discharged from the skilled rehab facility, please have the facility set up the patient's [place] prior to being released. Also, the skilled facility will be responsible for  providing the patient with their medications at time of release from the facility to include their pain medication, the muscle relaxants, and their blood thinner medication. If the patient is still at the [facility]

## 2021-09-02 NOTE — Progress Notes (Signed)
Physical Therapy Treatment ?Patient Details ?Name: Miguel Howell ?MRN: [id number] ?DOB: 06-Nov-1955 ?Today's Date: 09/02/2021 ? ? ?History of Present Illness Pt admitted for R THR on 09/01/21 ? ?  ?PT Comments  ? ? Patient tolerated PM session well, and reported 7-8/10 pain with functional activity, however did not limit participation. Patient was able to demonstrate 1 lap around the nurses station at SBA/SUP with RW with no LOB concerns. Patient was cued on proper stair sequencing and was able to ascend and descend 1 step with RW with a step to pattern to enter/exit his home. Patient is progressing towards his goals and would continue to benefit from skilled physical therapy to optimize patient's return to PLOF. Continue to recommend HHPT upon discharge from acute hospitalization.  ?   ?Recommendations for follow up therapy are one component of a multi-disciplinary discharge planning process, led by the attending physician.  Recommendations may be updated based on patient status, additional functional criteria and insurance authorization. ? ?Follow Up Recommendations ? Home health PT ?  ?  ?Assistance Recommended at Discharge Intermittent Supervision/Assistance  ?Patient can return home with the following A little help with walking and/or transfers;A little help with bathing/dressing/bathroom;Assist for transportation;Help with stairs or ramp for entrance ?  ?Equipment Recommendations ? Rolling walker (2 wheels)  ?  ?Recommendations for Other Services   ? ? ?  ?Precautions / Restrictions Precautions ?Precautions: Fall;Anterior Hip ?Precaution Booklet Issued: Yes (comment) ?Restrictions ?Weight Bearing Restrictions: Yes ?RLE Weight Bearing: Weight bearing as tolerated  ?  ? ?Mobility ? Bed Mobility ?Overal bed mobility: Needs Assistance ?Bed Mobility: Supine to Sit ?  ?  ?Supine to sit: Supervision ?  ?  ?General bed mobility comments: Increased time and effort, no physical assistance needed, Increased use of bed  rails ?Patient Response: Cooperative ? ?Transfers ?Overall transfer level: Needs assistance ?Equipment used: Rolling walker (2 wheels) ?Transfers: Sit to/from Stand ?Sit to Stand: Supervision ?  ?  ?  ?  ?  ?General transfer comment: good recall of hand placement ?  ? ?Ambulation/Gait ?Ambulation/Gait assistance: Supervision (SBA) ?Gait Distance (Feet): 250 Feet ?Assistive device: Rolling walker (2 wheels) ?Gait Pattern/deviations: Step-through pattern, Decreased step length - right, Decreased step length - left, Decreased stride length, Narrow base of support ?Gait velocity: decreased ?  ?  ?  ? ? ?Stairs ?Stairs: Yes ?Stairs assistance: Min guard (SBA) ?Stair Management: Step to pattern, No rails, With walker ?Number of Stairs: 1 ?General stair comments: cueing on proper stair sequencing, no LOB noted ? ? ?Wheelchair Mobility ?  ? ?Modified Rankin (Stroke Patients Only) ?  ? ? ?  ?Balance Overall balance assessment: Needs assistance ?Sitting-balance support: Feet supported ?Sitting balance-Leahy Scale: Good ?  ?  ?Standing balance support: During functional activity, Bilateral upper extremity supported, Reliant on assistive device for balance ?Standing balance-Leahy Scale: Good ?  ?  ?  ?  ?  ?  ?  ?  ?  ?  ?  ?  ?  ? ?  ?Cognition Arousal/Alertness: Awake/alert ?Behavior During Therapy: [facility] for tasks assessed/performed ?Overall Cognitive Status: Within Functional Limits for tasks assessed ?  ?  ?  ?  ?  ?  ?  ?  ?  ?  ?  ?  ?  ?  ?  ?  ?  ?  ?  ? ?  ?Exercises Total Joint Exercises ?Ankle Circles/Pumps: Supine, AROM, 20 reps, Both ?Quad Sets: Supine, 15 reps, AROM, Both, Strengthening ?Gluteal Sets: Supine, AROM,  15 reps, Both, Strengthening ?Short Arc Quad: Supine, 10 reps, AROM, Both, Strengthening ?Heel Slides: Supine, AROM, 10 reps, Both ?Hip ABduction/ADduction: Supine, AROM, 10 reps, Both, Strengthening ?Straight Leg Raises: Supine, AAROM, 10 reps, Both, Strengthening ?Long Arc Quad: Seated, AROM, 10 reps,  Both, Strengthening ? ?  ?General Comments   ?  ?  ? ?Pertinent Vitals/Pain Pain Assessment ?Pain Assessment: 0-10 ?Pain Score: 7  ?Pain Location: R hip ?Pain Descriptors / Indicators: Aching, Discomfort, Dull ?Pain Intervention(s): Monitored during session, Repositioned  ? ? ?Home Living Family/patient expects to be discharged to:: Private residence ?Living Arrangements: Alone ?Available Help at Discharge: Friend(s);Available PRN/intermittently (friend in room stated he could stay 24/7 for a few days) ?Type of Home: House ?Home Access: Stairs to enter ?Entrance Stairs-Rails: None ?Entrance Stairs-Number of Steps: 1 ?  ?Home Layout: One level ?Home Equipment: Gilmer Mor - single point;Toilet riser ?   ?  ?Prior Function    ?  ?  ?   ? ?PT Goals (current goals can now be found in the care plan section) Acute Rehab PT Goals ?Patient Stated Goal: to go home ?PT Goal Formulation: With patient ?Time For Goal Achievement: 09/15/21 ?Potential to Achieve Goals: Good ?Additional Goals ?Additional Goal #1: Pt will be able to perform bed mobility/transfers with mod I and safe technique in order to improve functional independence ?Progress towards PT goals: Progressing toward goals ? ?  ?Frequency ? ? ? BID ? ? ? ?  ?PT Plan Current plan remains appropriate  ? ? ?Co-evaluation   ?  ?  ?  ?  ? ?  ?AM-PAC PT "6 Clicks" Mobility   ?Outcome Measure ? Help needed turning from your back to your side while in a flat bed without using bedrails?: A Little ?Help needed moving from lying on your back to sitting on the side of a flat bed without using bedrails?: A Little ?Help needed moving to and from a bed to a chair (including a wheelchair)?: A Little ?Help needed standing up from a chair using your arms (e.g., wheelchair or bedside chair)?: A Little ?Help needed to walk in hospital room?: A Little ?Help needed climbing 3-5 steps with a railing? : A Little ?6 Click Score: 18 ? ?  ?End of Session Equipment Utilized During Treatment: Gait  belt ?Activity Tolerance: Patient tolerated treatment well ?Patient left: in bed;with call bell/phone within reach;with family/visitor present (sitting EOB) ?Nurse Communication: Mobility status ?PT Visit Diagnosis: Muscle weakness (generalized) (M62.81);Difficulty in walking, not elsewhere classified (R26.2);Pain ?Pain - Right/Left: Right ?Pain - part of body: Hip ?  ? ? ?Time: 1751-0258 ?PT Time Calculation (min) (ACUTE ONLY): 31 min ? ?Charges:  $Gait Training: 8-22 mins ?$Therapeutic Activity: 8-22 mins          ?          ? ?Angelica Ran, PT  ?09/02/21. 1:35 PM ? ? ?

## 2021-09-02 NOTE — Evaluation (Signed)
Occupational Therapy Evaluation ?Patient Details ?Name: Miguel Howell ?MRN: [id number] ?DOB: 05-11-1956 ?Today's Date: 09/02/2021 ? ? ?History of Present Illness Pt admitted for R THR on 09/01/21  ? ?Clinical Impression ?  ?Mr Docken was seen for OT evaluation this date. Prior to hospital admission, pt was MOD I for mobility and ADLs using SPC. Pt lives alone in home with 1 STE, plan for friend to assist PRN. Pt presents to acute OT demonstrating impaired ADL performance and functional mobility 2/2 decreased activity tolerance and functional ROM deficits. Pt currently requires MIN A don/doff B socks seated EOC - pt reports using AE at baseline. SBA + RW for ADL t/f. Simulated home bed setup for bed exit/entry with cues for hooking R leg, pt reports improved pain. All education complete, no skilled acute OT needs identified. Upon hospital discharge, recommend no OT follow up. ?   ? ?Recommendations for follow up therapy are one component of a multi-disciplinary discharge planning process, led by the attending physician.  Recommendations may be updated based on patient status, additional functional criteria and insurance authorization.  ? ?Follow Up Recommendations ? No OT follow up  ?  ?Assistance Recommended at Discharge Set up Supervision/Assistance  ?Patient can return home with the following A little help with walking and/or transfers;A little help with bathing/dressing/bathroom ? ?  ?Functional Status Assessment ? Patient has had a recent decline in their functional status and demonstrates the ability to make significant improvements in function in a reasonable and predictable amount of time.  ?Equipment Recommendations ? Tub/shower seat  ?  ?Recommendations for Other Services   ? ? ?  ?Precautions / Restrictions Precautions ?Precautions: Fall;Anterior Hip ?Precaution Booklet Issued: Yes (comment) ?Restrictions ?Weight Bearing Restrictions: Yes ?RLE Weight Bearing: Weight bearing as tolerated  ? ?  ? ?Mobility Bed  Mobility ?Overal bed mobility: Needs Assistance ?Bed Mobility: Supine to Sit, Sit to Supine ?  ?  ?Supine to sit: Supervision ?Sit to supine: Supervision ?  ?General bed mobility comments: cues for hooking L leg ?  ? ?Transfers ?Overall transfer level: Needs assistance ?Equipment used: Rolling walker (2 wheels) ?Transfers: Sit to/from Stand ?Sit to Stand: Supervision ?  ?  ?  ?  ?  ?General transfer comment: good recall of hand placement ?  ? ?  ?Balance Overall balance assessment: Needs assistance ?Sitting-balance support: Feet supported ?Sitting balance-Leahy Scale: Good ?  ?  ?Standing balance support: No upper extremity supported, During functional activity ?Standing balance-Leahy Scale: Good ?  ?  ?  ?  ?  ?  ?  ?  ?  ?  ?  ?  ?   ? ?ADL either performed or assessed with clinical judgement  ? ?ADL Overall ADL's : Needs assistance/impaired ?  ?  ?  ?  ?  ?  ?  ?  ?  ?  ?  ?  ?  ?  ?  ?  ?  ?  ?  ?General ADL Comments: MIN A don/doff B socks seated EOC - pt reports using AE at baseline. SBA + RW for ADL t/f and bed t/f.  ? ? ? ? ?Pertinent Vitals/Pain Pain Assessment ?Pain Assessment: 0-10 ?Pain Score: 7  ?Pain Location: R hip ?Pain Descriptors / Indicators: Aching, Discomfort, Dull ?Pain Intervention(s): Limited activity within patient's tolerance, Repositioned, RN gave pain meds during session  ? ? ? ?Hand Dominance   ?  ?Extremity/Trunk Assessment Upper Extremity Assessment ?Upper Extremity Assessment: Overall WFL for tasks assessed ?  ?  Lower Extremity Assessment ?Lower Extremity Assessment: Overall WFL for tasks assessed ?  ?  ?  ?Communication Communication ?Communication: No difficulties ?  ?Cognition Arousal/Alertness: Awake/alert ?Behavior During Therapy: [facility] for tasks assessed/performed ?Overall Cognitive Status: Within Functional Limits for tasks assessed ?  ?  ?  ?  ?  ?  ?  ?  ?  ?  ?  ?  ?  ?  ?  ?  ?  ?  ?  ? ?Home Living Family/patient expects to be discharged to:: Private residence ?Living  Arrangements: Alone ?Available Help at Discharge: Friend(s);Available PRN/intermittently (friend in room stated he could stay 24/7 for a few days) ?Type of Home: House ?Home Access: Stairs to enter ?Entrance Stairs-Number of Steps: 1 ?Entrance Stairs-Rails: None ?Home Layout: One level ?  ?  ?Bathroom Shower/Tub: Walk-in shower ?  ?Bathroom Toilet: Standard ?  ?  ?Home Equipment: Kasandra Knudsen - single point;Toilet riser ?  ?  ?  ? ?  ?Prior Functioning/Environment Prior Level of Function : Independent/Modified Independent ?  ?  ?  ?  ?  ?  ?Mobility Comments: was previously using SPC, reports no history of falls ?  ?  ? ?  ?  ?OT Problem List: Decreased range of motion;Decreased activity tolerance ?  ?   ?   ?OT Goals(Current goals can be found in the care plan section) Acute Rehab OT Goals ?Patient Stated Goal: to go home ?OT Goal Formulation: With patient ?Time For Goal Achievement: 09/16/21 ?Potential to Achieve Goals: Good  ? ?AM-PAC OT "6 Clicks" Daily Activity     ?Outcome Measure Help from another person eating meals?: None ?Help from another person taking care of personal grooming?: A Little ?Help from another person toileting, which includes using toliet, bedpan, or urinal?: A Little ?Help from another person bathing (including washing, rinsing, drying)?: A Little ?Help from another person to put on and taking off regular upper body clothing?: None ?Help from another person to put on and taking off regular lower body clothing?: A Little ?6 Click Score: 20 ?  ?End of Session Equipment Utilized During Treatment: Rolling walker (2 wheels) ?Nurse Communication: Mobility status ? ?Activity Tolerance: Patient tolerated treatment well ?Patient left: in bed;with call bell/phone within reach ? ?OT Visit Diagnosis: Unsteadiness on feet (R26.81)  ?              ?Time: [id number] ?OT Time Calculation (min): 25 min ?Charges:  OT General Charges ?$OT Visit: 1 Visit ?OT Evaluation ?$OT Eval Low Complexity: 1 Low ?OT  Treatments ?$Self Care/Home Management : 8-22 mins ? ?Dessie Coma, M.S. OTR/L  ?09/02/21, 10:24 AM  ?ascom [phone] ? ?

## 2021-09-02 NOTE — Discharge Summary (Signed)
?Physician Discharge Summary  ?Patient ID: ?Miguel Howell ?MRN: [id number] ?DOB/AGE: 64-Jun-1957 64 y.o. ? ?Admit date: 09/01/2021 ?Discharge date: 09/02/2021 ? ?Admission Diagnoses:  ?Status post total hip replacement, right [Z96.641] ? ? ?Discharge Diagnoses: ?Patient Active Problem List  ? Diagnosis Date Noted  ? 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 COPD with acute exacerbation (HCC) 05/07/2018  ? Acute on chronic diastolic CHF (congestive heart failure) (HCC) 06/12/2016  ? Pneumonia 06/12/2016  ? Hyperkalemia 06/12/2016  ? Encephalopathy, metabolic 06/12/2016  ? Wide-complex tachycardia 06/12/2016  ? Acute on chronic respiratory failure with hypoxia and hypercapnia (HCC) 06/08/2016  ? COPD exacerbation (HCC) 06/04/2016  ? Diarrhea 03/25/2016  ? Dehydration 03/25/2016  ? Hypokalemia 03/25/2016  ? HTN (hypertension) 03/25/2016  ? COPD (chronic obstructive pulmonary disease) (HCC) 03/25/2016  ? ? ?Past Medical History:  ?Diagnosis Date  ? 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 Asthma   ? CHF (congestive heart failure) (HCC)   ? COPD (chronic obstructive pulmonary disease) (HCC)   ? Dyspnea   ? Hypertension   ? ?  ?Transfusion: none ?  ?Consultants (if any):  ? ?Discharged Condition: Improved ? ?Hospital Course: Miguel Howell is an 66 y.o. male who was admitted 09/01/2021 with a diagnosis of Status post total hip replacement, right and went to the operating room on 09/01/2021 and underwent the above named procedures.  ?  ?Surgeries: Procedure(s): ?TOTAL HIP ARTHROPLASTY ANTERIOR APPROACH on 09/01/2021 ?Patient tolerated the surgery well. Taken to PACU where she was stabilized and then transferred to the orthopedic floor. ? ?Started on Lovenox 40 mg q 24 hrs. Foot pumps applied bilaterally at 80 mm. Heels elevated on bed with rolled towels. No evidence of DVT. Negative Homan. ?Physical therapy started on day #1 for gait training and transfer. OT started day #1 for ADL and assisted devices. ? ?Patient's  foley was d/c on day #1. Patient's IV  was d/c on day #1. ? ?On post op day #1 patient was stable and ready for discharge to home with HHPT. ? ? ? ?He was given perioperative antibiotics:  ?Anti-infectives (From admission, onward)  ? ? Start     Dose/Rate Route Frequency Ordered Stop  ? 09/01/21 1700  ceFAZolin (ANCEF) IVPB 2g/100 mL premix       ? 2 g ?200 mL/hr over 30 Minutes Intravenous Every 6 hours 09/01/21 1446 09/01/21 2327  ? 09/01/21 0829  ceFAZolin (ANCEF) 2-4 GM/100ML-% IVPB       ?Note to Pharmacy: Miguel Howell: cabinet override  ?    09/01/21 0829 09/01/21 2327  ? 09/01/21 0600  ceFAZolin (ANCEF) IVPB 2g/100 mL premix       ? 2 g ?200 mL/hr over 30 Minutes Intravenous On call to O.R. 08/31/21 2248 09/01/21 1044  ? ?  ?. ? ?He was given sequential compression devices, early ambulation, and Lovenox TEDs for DVT prophylaxis. ? ?He benefited maximally from the hospital stay and there were no complications.   ? ?Recent vital signs:  ?Vitals:  ? 09/02/21 0553 09/02/21 0734  ?BP: (!) 150/67 (!) 156/77  ?Pulse: 75 64  ?Resp: 17 18  ?Temp: 97.7 ?F (36.5 ?C) 98.2 ?F (36.8 ?C)  ?SpO2: 93% 90%  ? ? ?Recent laboratory studies:  ?Lab Results  ?Component Value Date  ? HGB 13.7 09/02/2021  ? HGB 13.3 09/01/2021  ? HGB 14.5 08/22/2021  ? ?Lab Results  ?Component Value Date  ? WBC 6.5 09/02/2021  ? PLT  155 09/02/2021  ? ?Lab Results  ?Component Value Date  ? INR 1.27 06/04/2016  ? ?Lab Results  ?Component Value Date  ? NA 133 (L) 09/02/2021  ? K 3.7 09/02/2021  ? CL 97 (L) 09/02/2021  ? CO2 31 09/02/2021  ? BUN 15 09/02/2021  ? CREATININE 1.03 09/02/2021  ? GLUCOSE 123 (H) 09/02/2021  ? ? ?Discharge Medications:   ?Allergies as of 09/02/2021   ?No Known Allergies ?  ? ?  ?Medication List  ?  ? ?STOP taking these medications   ? ?acetaminophen 500 MG tablet ?Commonly known as: TYLENOL ?  ? ?  ? ?TAKE these medications   ? ?Advair HFA 230-21 MCG/ACT inhaler ?Generic drug: fluticasone-salmeterol ?Inhale 2 puffs into  the lungs 2 (two) times daily. ?  ?albuterol 108 (90 Base) MCG/ACT inhaler ?Commonly known as: VENTOLIN HFA ?Inhale 2 puffs into the lungs every 4 (four) hours as needed for wheezing or shortness of breath. ?  ?ARGININE PO ?Take 1 capsule by mouth 2 (two) times daily. With citrulline ?  ?aspirin EC 81 MG tablet ?Take 81 mg by mouth daily. ?  ?calcium carbonate 500 MG chewable tablet ?Commonly known as: TUMS - dosed in mg elemental calcium ?Chew 1 tablet by mouth daily as needed for indigestion or heartburn. ?  ?carvedilol 6.25 MG tablet ?Commonly known as: COREG ?Take 3.125 mg by mouth 2 (two) times daily. ?  ?diazepam 10 MG tablet ?Commonly known as: VALIUM ?Take 10 mg by mouth at bedtime as needed for anxiety or sleep. ?  ?docusate sodium 100 MG capsule ?Commonly known as: COLACE ?Take 1 capsule (100 mg total) by mouth 2 (two) times daily. ?  ?enoxaparin 40 MG/0.4ML injection ?Commonly known as: LOVENOX ?Inject 0.4 mLs (40 mg total) into the skin daily for 14 days. ?Start taking on: September 03, 2021 ?  ?furosemide 40 MG tablet ?Commonly known as: LASIX ?Take 1 tablet (40 mg total) by mouth daily. ?  ?methocarbamol 500 MG tablet ?Commonly known as: ROBAXIN ?Take 1 tablet (500 mg total) by mouth every 6 (six) hours as needed for muscle spasms. ?  ?montelukast 10 MG tablet ?Commonly known as: SINGULAIR ?Take 10 mg by mouth daily. ?  ?nabumetone 500 MG tablet ?Commonly known as: RELAFEN ?Take 500 mg by mouth 2 (two) times daily. ?  ?oxyCODONE 5 MG immediate release tablet ?Commonly known as: Oxy IR/ROXICODONE ?Take 1-2 tablets (5-10 mg total) by mouth every 4 (four) hours as needed for moderate pain (pain score 4-6). ?  ?potassium chloride 10 MEQ tablet ?Commonly known as: KLOR-CON M ?Take 1 tablet (10 mEq total) by mouth 2 (two) times daily. ?  ?PSYLLIUM HUSK PO ?Take 2 capsules by mouth 2 (two) times daily. ?  ?Spiriva Respimat 2.5 MCG/ACT Aers ?Generic drug: Tiotropium Bromide Monohydrate ?Inhale 1 puff into the lungs  2 (two) times daily. ?  ?tadalafil 20 MG tablet ?Commonly known as: CIALIS ?Take 20 mg by mouth See admin instructions. Take 20 mg every 3 days as needed for ED ?  ?traMADol 50 MG tablet ?Commonly known as: ULTRAM ?Take 1 tablet (50 mg total) by mouth every 6 (six) hours as needed. ?  ? ?  ? ?  ?  ? ? ?  ?Durable Medical Equipment  ?(From admission, onward)  ?  ? ? ?  ? ?  Start     Ordered  ? 09/01/21 1218  DME Walker rolling  Once       ?Question Answer Comment  ?  Walker: With 5 Inch Wheels   ?Patient needs a walker to treat with the following condition Status post total hip replacement, right   ?  ? 09/01/21 1217  ? 09/01/21 1218  DME 3 n 1  Once       ? 09/01/21 1217  ? 09/01/21 1218  DME Bedside commode  Once       ?Question:  Patient needs a bedside commode to treat with the following condition  Answer:  Status post total hip replacement, right  ? 09/01/21 1217  ? ?  ?  ? ?  ? ? ?Diagnostic Studies: DG C-Arm 1-60 Min-No Report ? ?Result Date: 09/01/2021 ?Fluoroscopy was utilized by the requesting physician.  No radiographic interpretation.  ? ?DG HIP UNILAT WITH PELVIS 1V RIGHT ? ?Result Date: 09/01/2021 ?CLINICAL DATA:  Right total hip arthroplasty. EXAM: DG HIP (WITH OR WITHOUT PELVIS) 1V RIGHT COMPARISON:  None. FINDINGS: Single portable radiograph obtained in the operating room via C-arm radiography shows changes of a right total hip arthroplasty. The hardware components are in anatomic alignment. No immediate complications identified. IMPRESSION: Status post right total hip arthroplasty.  None Electronically Signed   By: Miguel Howell M.D.   On: 09/01/2021 12:12  ? ?DG HIP UNILAT W OR W/O PELVIS 2-3 VIEWS RIGHT ? ?Result Date: 09/01/2021 ?CLINICAL DATA:  Post total hip arthroplasty. EXAM: DG HIP (WITH OR WITHOUT PELVIS) 2-3V RIGHT COMPARISON:  Intraoperative imaging from September 01, 2021. FINDINGS: Post RIGHT total hip arthroplasty. Surgical drain in the soft tissues. Skin staples overlying the site. Small  amounts of gas in the soft tissues as well. No unexpected radiographic findings. IMPRESSION: Post RIGHT total hip arthroplasty. No unexpected radiographic findings. Electronically Signed   By: Juliene Pina

## 2021-09-02 NOTE — TOC Transition Note (Signed)
Transition of Care (TOC) - CM/SW Discharge Note ? ? ?Patient Details  ?Name: Miguel Howell ?MRN: [id number] ?Date of Birth: Feb 24, 1956 ? ?Transition of Care (TOC) CM/SW Contact:  ?Margarito Liner, LCSW ?Phone Number: ?09/02/2021, 3:28 PM ? ? ?Clinical Narrative:  Patient has orders to discharge home today. No further concerns. CSW signing off.  ? ?Final next level of care: Home w Home Health Services ?Barriers to Discharge: Barriers Resolved ? ? ?Patient Goals and CMS Choice ?  ?  ?Choice offered to / list presented to : Patient ? ?Discharge Placement ?  ?           ?  ?  ?  ?Patient and family notified of of transfer: 09/02/21 ? ?Discharge Plan and Services ?  ?  ?           ?DME Arranged: Walker rolling, Shower stool ?DME Agency: [employer] ?Date DME Agency Contacted: 09/02/21 ?  ?Representative spoke with at DME Agency: Bjorn Loser ?HH Arranged: PT ?HH Agency: [facility] ?Date HH Agency Contacted: 09/02/21 ?  ?Representative spoke with at [facility] Agency: Feliberto Gottron ? ?Social Determinants of Health (SDOH) Interventions ?  ? ? ?Readmission Risk Interventions ?   ? View : No data to display.  ?  ?  ?  ? ? ? ? ? ?

## 2021-09-02 NOTE — Progress Notes (Signed)
Blood pressure (!) 156/77, pulse 64, temperature 98.2 ?F (36.8 ?C), resp. rate 18, height 5' 8.5" (1.74 m), weight 99.8 kg, SpO2 90 %. ?IV removed site c/d/I. Pt d/c with teds and prevena and two [facility] drsgs scripts sent to RX. Pt d/c with all belongings via w/c down to private car.  ?

## 2021-09-02 NOTE — Progress Notes (Signed)
? ?  Subjective: ?1 Day Post-Op Procedure(s) (LRB): ?TOTAL HIP ARTHROPLASTY ANTERIOR APPROACH (Right) ?Patient reports pain as 6 on 0-10 scale.   ?Patient is well, and has had no acute complaints or problems ?Denies any CP, SOB, ABD pain. ?We will continue therapy today.  ?Plan is to go Home after hospital stay. ? ?Objective: ?Vital signs in last 24 hours: ?Temp:  [97 ?F (36.1 ?C)-98.2 ?F (36.8 ?C)] 98.2 ?F (36.8 ?C) (03/31 0734) ?Pulse Rate:  [40-80] 64 (03/31 0734) ?Resp:  [12-25] 18 (03/31 0734) ?BP: (104-177)/(46-77) 156/77 (03/31 0734) ?SpO2:  [90 %-100 %] 90 % (03/31 0734) ?Weight:  [99.8 kg] 99.8 kg (03/30 0822) ? ?Intake/Output from previous day: ?03/30 0701 - 03/31 0700 ?In: [id number] [P.O.:390; I.V.:800; IV Piggyback:300] ?Out: 1050 [Urine:800; Blood:250] ?Intake/Output this shift: ?No intake/output data recorded. ? ?Recent Labs  ?  09/01/21 ?1503 09/02/21 ?0542  ?HGB 13.3 13.7  ? ?Recent Labs  ?  09/01/21 ?1503 09/02/21 ?0542  ?WBC 6.9 6.5  ?RBC 4.07* 4.14*  ?HCT 41.1 41.6  ?PLT 161 155  ? ?Recent Labs  ?  09/01/21 ?1503 09/02/21 ?0542  ?NA  --  133*  ?K  --  3.7  ?CL  --  97*  ?CO2  --  31  ?BUN  --  15  ?CREATININE 0.92 1.03  ?GLUCOSE  --  123*  ?CALCIUM  --  8.8*  ? ?No results for input(s): LABPT, INR in the last 72 hours. ? ?EXAM ?General - Patient is Alert, Appropriate, and Oriented ?Extremity - Neurovascular intact ?Sensation intact distally ?Intact pulses distally ?Dorsiflexion/Plantar flexion intact ?Dressing - dressing C/D/I and no drainage, provena intact with out drainage ?Motor Function - intact, moving foot and toes well on exam.  ? ?Past Medical History:  ?Diagnosis Date  ? 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 Asthma   ? CHF (congestive heart failure) ([place])   ? COPD (chronic obstructive pulmonary disease) ([place])   ? Dyspnea   ? Hypertension   ? ? ?Assessment/Plan:   ?1 Day Post-Op Procedure(s) (LRB): ?TOTAL HIP ARTHROPLASTY ANTERIOR APPROACH (Right) ?Principal Problem: ?  Status post total hip  replacement, right ? ?Estimated body mass index is 32.96 kg/m? as calculated from the following: ?  Height as of this encounter: 5' 8.5" (1.74 m). ?  Weight as of this encounter: 99.8 kg. ?Advance diet ?Up with therapy ?Pain controlled ?VSS ?Labs stable ?CM to assist with discharge to home with HHPT ? ?DVT Prophylaxis - Lovenox, TED hose, and SCDs ?Weight-Bearing as tolerated to right leg ? ? ?T. Rachelle Hora, PA-C ?[place] ?09/02/2021, 8:02 AM ?  ?

## 2021-09-02 NOTE — Plan of Care (Signed)

## 2021-09-02 NOTE — Anesthesia Postprocedure Evaluation (Signed)
Anesthesia Post Note ? ?Patient: Miguel Howell ? ?Procedure(s) Performed: TOTAL HIP ARTHROPLASTY ANTERIOR APPROACH (Right: Hip) ? ?Patient location during evaluation: Nursing Unit ?Anesthesia Type: Spinal ?Level of consciousness: awake and alert and oriented ?Pain management: pain level controlled ?Vital Signs Assessment: post-procedure vital signs reviewed and stable ?Respiratory status: respiratory function stable ?Cardiovascular status: stable ?Postop Assessment: no headache, no backache, patient able to bend at knees, no apparent nausea or vomiting, adequate PO intake and able to ambulate ?Anesthetic complications: no ? ? ?No notable events documented. ? ? ?Last Vitals:  ?Vitals:  ? 09/02/21 0553 09/02/21 0734  ?BP: (!) 150/67 (!) 156/77  ?Pulse: 75 64  ?Resp: 17 18  ?Temp: 36.5 ?C 36.8 ?C  ?SpO2: 93% 90%  ?  ?Last Pain:  ?Vitals:  ? 09/02/21 0454  ?TempSrc:   ?PainSc: Asleep  ? ? ?  ?  ?  ?  ?  ?  ? ?Omer Jack F ? ? ? ? ?

## 2021-09-05 LAB — SURGICAL PATHOLOGY

## 2021-10-16 ENCOUNTER — Inpatient Hospital Stay
Admission: RE | Admit: 2021-10-16 | Discharge: 2021-10-19 | DRG: 177 | Disposition: A | Discharge status: Home / Self Care | Payer: 59 | Attending: Internal Medicine | Admitting: Internal Medicine

## 2021-10-16 ENCOUNTER — Encounter: Payer: Self-pay | Admitting: Emergency Medicine

## 2021-10-16 ENCOUNTER — Emergency Department: Payer: 59

## 2021-10-16 ENCOUNTER — Other Ambulatory Visit: Payer: Self-pay

## 2021-10-16 DIAGNOSIS — N179 Acute kidney failure, unspecified: Secondary | ICD-10-CM

## 2021-10-16 DIAGNOSIS — Z7951 Long term (current) use of inhaled steroids: Secondary | ICD-10-CM

## 2021-10-16 DIAGNOSIS — U071 COVID-19: Secondary | ICD-10-CM | POA: Diagnosis present

## 2021-10-16 DIAGNOSIS — J9622 Acute and chronic respiratory failure with hypercapnia: Secondary | ICD-10-CM | POA: Diagnosis present

## 2021-10-16 DIAGNOSIS — J441 Chronic obstructive pulmonary disease with (acute) exacerbation: Secondary | ICD-10-CM | POA: Diagnosis present

## 2021-10-16 DIAGNOSIS — I5032 Chronic diastolic (congestive) heart failure: Secondary | ICD-10-CM | POA: Diagnosis present

## 2021-10-16 DIAGNOSIS — Z79899 Other long term (current) drug therapy: Secondary | ICD-10-CM | POA: Diagnosis not present

## 2021-10-16 DIAGNOSIS — Z7982 Long term (current) use of aspirin: Secondary | ICD-10-CM

## 2021-10-16 DIAGNOSIS — Z96641 Presence of right artificial hip joint: Secondary | ICD-10-CM | POA: Diagnosis present

## 2021-10-16 DIAGNOSIS — Z825 Family history of asthma and other chronic lower respiratory diseases: Secondary | ICD-10-CM

## 2021-10-16 DIAGNOSIS — J9601 Acute respiratory failure with hypoxia: Secondary | ICD-10-CM | POA: Diagnosis present

## 2021-10-16 DIAGNOSIS — E669 Obesity, unspecified: Secondary | ICD-10-CM | POA: Diagnosis present

## 2021-10-16 DIAGNOSIS — F17210 Nicotine dependence, cigarettes, uncomplicated: Secondary | ICD-10-CM | POA: Diagnosis present

## 2021-10-16 DIAGNOSIS — I10 Essential (primary) hypertension: Secondary | ICD-10-CM | POA: Diagnosis present

## 2021-10-16 DIAGNOSIS — Z6831 Body mass index (BMI) 31.0-31.9, adult: Secondary | ICD-10-CM

## 2021-10-16 DIAGNOSIS — J9621 Acute and chronic respiratory failure with hypoxia: Secondary | ICD-10-CM | POA: Diagnosis present

## 2021-10-16 DIAGNOSIS — Z8249 Family history of ischemic heart disease and other diseases of the circulatory system: Secondary | ICD-10-CM

## 2021-10-16 DIAGNOSIS — I110 Hypertensive heart disease with heart failure: Secondary | ICD-10-CM | POA: Diagnosis present

## 2021-10-16 DIAGNOSIS — J9612 Chronic respiratory failure with hypercapnia: Secondary | ICD-10-CM

## 2021-10-16 DIAGNOSIS — E860 Dehydration: Secondary | ICD-10-CM | POA: Diagnosis present

## 2021-10-16 DIAGNOSIS — Z833 Family history of diabetes mellitus: Secondary | ICD-10-CM

## 2021-10-16 LAB — COMPREHENSIVE METABOLIC PANEL
ALT: 14 U/L (ref 0–44)
AST: 20 U/L (ref 15–41)
Albumin: 3.8 g/dL (ref 3.5–5.0)
Alkaline Phosphatase: 102 U/L (ref 38–126)
Anion gap: 5 (ref 5–15)
BUN: 16 mg/dL (ref 8–23)
CO2: 30 mmol/L (ref 22–32)
Calcium: 9.4 mg/dL (ref 8.9–10.3)
Chloride: 99 mmol/L (ref 98–111)
Creatinine, Ser: 1.33 mg/dL — ABNORMAL HIGH (ref 0.61–1.24)
GFR, Estimated: 60 mL/min — ABNORMAL LOW (ref >60)
Glucose, Bld: 93 mg/dL (ref 70–99)
Potassium: 4.2 mmol/L (ref 3.5–5.1)
Sodium: 134 mmol/L — ABNORMAL LOW (ref 135–145)
Total Bilirubin: 0.9 mg/dL (ref 0.3–1.2)
Total Protein: 7.2 g/dL (ref 6.5–8.1)

## 2021-10-16 LAB — CBC WITH DIFFERENTIAL/PLATELET
Abs Immature Granulocytes: 0.04 10*3/uL (ref 0.00–0.07)
Basophils Absolute: 0 10*3/uL (ref 0.0–0.1)
Basophils Relative: 1 %
Eosinophils Absolute: 0 10*3/uL (ref 0.0–0.5)
Eosinophils Relative: 0 %
HCT: 45.7 % (ref 39.0–52.0)
Hemoglobin: 14.4 g/dL (ref 13.0–17.0)
Immature Granulocytes: 1 %
Lymphocytes Relative: 22 %
Lymphs Abs: 0.9 10*3/uL (ref 0.7–4.0)
MCH: 32.4 pg (ref 26.0–34.0)
MCHC: 31.5 g/dL (ref 30.0–36.0)
MCV: 102.9 fL — ABNORMAL HIGH (ref 80.0–100.0)
Monocytes Absolute: 0.6 10*3/uL (ref 0.1–1.0)
Monocytes Relative: 13 %
Neutro Abs: 2.7 10*3/uL (ref 1.7–7.7)
Neutrophils Relative %: 63 %
Platelets: 154 10*3/uL (ref 150–400)
RBC: 4.44 MIL/uL (ref 4.22–5.81)
RDW: 13.2 % (ref 11.5–15.5)
WBC: 4.3 10*3/uL (ref 4.0–10.5)
nRBC: 0 % (ref 0.0–0.2)

## 2021-10-16 LAB — RESP PANEL BY RT-PCR (FLU A&B, COVID) ARPGX2
Influenza A by PCR: NEGATIVE
Influenza B by PCR: NEGATIVE
SARS Coronavirus 2 by RT PCR: POSITIVE — AB

## 2021-10-16 LAB — TROPONIN I (HIGH SENSITIVITY)
Troponin I (High Sensitivity): 10 ng/L (ref <18)
Troponin I (High Sensitivity): 8 ng/L (ref <18)

## 2021-10-16 LAB — PROCALCITONIN: Procalcitonin: <0.1 ng/mL

## 2021-10-16 LAB — HIV ANTIBODY (ROUTINE TESTING W REFLEX): HIV Screen 4th Generation wRfx: NONREACTIVE

## 2021-10-16 LAB — BRAIN NATRIURETIC PEPTIDE: B Natriuretic Peptide: 104.4 pg/mL — ABNORMAL HIGH (ref 0.0–100.0)

## 2021-10-16 MED ORDER — PREDNISONE 20 MG PO TABS
60.0000 mg | ORAL_TABLET | Freq: Once | ORAL | Status: AC
  Administered 2021-10-16: 60 mg via ORAL
  Filled 2021-10-16: qty 3

## 2021-10-16 MED ORDER — METHYLPREDNISOLONE SODIUM SUCC 125 MG IJ SOLR
125.0000 mg | Freq: Two times a day (BID) | INTRAMUSCULAR | Status: DC
  Administered 2021-10-16 – 2021-10-17 (×2): 125 mg via INTRAVENOUS
  Filled 2021-10-16 (×2): qty 2

## 2021-10-16 MED ORDER — DIAZEPAM 5 MG PO TABS: 10.0000 mg | ORAL_TABLET | Freq: Every evening | ORAL | Status: DC | PRN

## 2021-10-16 MED ORDER — IPRATROPIUM-ALBUTEROL 0.5-2.5 (3) MG/3ML IN SOLN
3.0000 mL | Freq: Once | RESPIRATORY_TRACT | Status: AC
  Administered 2021-10-16: 3 mL via RESPIRATORY_TRACT
  Filled 2021-10-16: qty 3

## 2021-10-16 MED ORDER — ALBUTEROL SULFATE (2.5 MG/3ML) 0.083% IN NEBU
5.0000 mg | INHALATION_SOLUTION | Freq: Once | RESPIRATORY_TRACT | Status: AC
  Administered 2021-10-16: 5 mg via RESPIRATORY_TRACT
  Filled 2021-10-16: qty 6

## 2021-10-16 MED ORDER — POTASSIUM CHLORIDE CRYS ER 10 MEQ PO TBCR
10.0000 meq | EXTENDED_RELEASE_TABLET | Freq: Two times a day (BID) | ORAL | Status: DC
  Administered 2021-10-16 – 2021-10-18 (×4): 10 meq via ORAL
  Filled 2021-10-16 (×4): qty 1

## 2021-10-16 MED ORDER — SODIUM CHLORIDE 0.9% FLUSH
3.0000 mL | Freq: Two times a day (BID) | INTRAVENOUS | Status: DC
  Administered 2021-10-16 – 2021-10-19 (×7): 3 mL via INTRAVENOUS

## 2021-10-16 MED ORDER — NABUMETONE 500 MG PO TABS
500.0000 mg | ORAL_TABLET | Freq: Two times a day (BID) | ORAL | Status: DC
  Administered 2021-10-16 – 2021-10-19 (×6): 500 mg via ORAL
  Filled 2021-10-16 (×6): qty 1

## 2021-10-16 MED ORDER — SODIUM CHLORIDE 0.9% FLUSH: 3.0000 mL | INTRAVENOUS | Status: DC | PRN

## 2021-10-16 MED ORDER — ASPIRIN EC 81 MG PO TBEC
81.0000 mg | DELAYED_RELEASE_TABLET | Freq: Every day | ORAL | Status: DC
  Administered 2021-10-17 – 2021-10-19 (×3): 81 mg via ORAL
  Filled 2021-10-16 (×3): qty 1

## 2021-10-16 MED ORDER — METHOCARBAMOL 500 MG PO TABS
500.0000 mg | ORAL_TABLET | Freq: Four times a day (QID) | ORAL | Status: DC | PRN
  Administered 2021-10-16 – 2021-10-19 (×4): 500 mg via ORAL
  Filled 2021-10-16 (×5): qty 1

## 2021-10-16 MED ORDER — TRAMADOL HCL 50 MG PO TABS
50.0000 mg | ORAL_TABLET | Freq: Four times a day (QID) | ORAL | Status: DC | PRN
  Administered 2021-10-16 – 2021-10-17 (×2): 50 mg via ORAL
  Filled 2021-10-16 (×2): qty 1

## 2021-10-16 MED ORDER — CARVEDILOL 3.125 MG PO TABS
3.1250 mg | ORAL_TABLET | Freq: Two times a day (BID) | ORAL | Status: DC
  Administered 2021-10-16 – 2021-10-19 (×6): 3.125 mg via ORAL
  Filled 2021-10-16 (×6): qty 1

## 2021-10-16 MED ORDER — DOCUSATE SODIUM 100 MG PO CAPS
100.0000 mg | ORAL_CAPSULE | Freq: Two times a day (BID) | ORAL | Status: DC
  Administered 2021-10-17 – 2021-10-19 (×5): 100 mg via ORAL
  Filled 2021-10-16 (×6): qty 1

## 2021-10-16 MED ORDER — SODIUM CHLORIDE 0.9 % IV SOLN: 250.0000 mL | INTRAVENOUS | Status: DC | PRN

## 2021-10-16 MED ORDER — ENOXAPARIN SODIUM 60 MG/0.6ML IJ SOSY
0.5000 mg/kg | PREFILLED_SYRINGE | INTRAMUSCULAR | Status: DC
  Administered 2021-10-16 – 2021-10-18 (×3): 50 mg via SUBCUTANEOUS
  Filled 2021-10-16 (×3): qty 0.6

## 2021-10-16 MED ORDER — MOMETASONE FURO-FORMOTEROL FUM 200-5 MCG/ACT IN AERO
2.0000 | INHALATION_SPRAY | Freq: Two times a day (BID) | RESPIRATORY_TRACT | Status: DC
  Administered 2021-10-16 – 2021-10-19 (×6): 2 via RESPIRATORY_TRACT
  Filled 2021-10-16: qty 8.8

## 2021-10-16 MED ORDER — TIOTROPIUM BROMIDE MONOHYDRATE 18 MCG IN CAPS
1.0000 | ORAL_CAPSULE | Freq: Two times a day (BID) | RESPIRATORY_TRACT | Status: DC
  Administered 2021-10-17 – 2021-10-19 (×5): 18 ug via RESPIRATORY_TRACT
  Filled 2021-10-16: qty 5

## 2021-10-16 MED ORDER — ALBUTEROL SULFATE (2.5 MG/3ML) 0.083% IN NEBU: 2.5000 mg | INHALATION_SOLUTION | Freq: Four times a day (QID) | RESPIRATORY_TRACT | Status: DC | PRN

## 2021-10-16 MED ORDER — OXYCODONE HCL 5 MG PO TABS: 5.0000 mg | ORAL_TABLET | ORAL | Status: DC | PRN

## 2021-10-16 MED ORDER — MONTELUKAST SODIUM 10 MG PO TABS
10.0000 mg | ORAL_TABLET | Freq: Every day | ORAL | Status: DC
  Administered 2021-10-17 – 2021-10-19 (×3): 10 mg via ORAL
  Filled 2021-10-16 (×3): qty 1

## 2021-10-16 MED ORDER — ALBUTEROL SULFATE HFA 108 (90 BASE) MCG/ACT IN AERS
2.0000 | INHALATION_SPRAY | Freq: Four times a day (QID) | RESPIRATORY_TRACT | Status: DC
  Administered 2021-10-16 – 2021-10-19 (×10): 2 via RESPIRATORY_TRACT
  Filled 2021-10-16: qty 6.7

## 2021-10-16 MED ORDER — ALBUTEROL SULFATE (2.5 MG/3ML) 0.083% IN NEBU: 2.5000 mg | INHALATION_SOLUTION | Freq: Four times a day (QID) | RESPIRATORY_TRACT | Status: DC

## 2021-10-16 MED ORDER — FUROSEMIDE 40 MG PO TABS
40.0000 mg | ORAL_TABLET | Freq: Every day | ORAL | Status: DC
Start: 2021-10-16 — End: 2021-10-17
  Administered 2021-10-17: 40 mg via ORAL
  Filled 2021-10-16: qty 1

## 2021-10-16 MED ORDER — ACETAMINOPHEN 500 MG PO TABS
1000.0000 mg | ORAL_TABLET | Freq: Once | ORAL | Status: AC
  Administered 2021-10-16: 1000 mg via ORAL
  Filled 2021-10-16: qty 2

## 2021-10-16 NOTE — Assessment & Plan Note (Addendum)
--  started on IV solumedrol with rapid improvement in respiratory symptoms, transitioned to prednisone.  Nebulizer treatments during hospitalization.  Continue home inhalers upon discharge ?

## 2021-10-16 NOTE — Assessment & Plan Note (Addendum)
Continue home coreg ?--hold home Lasix ?

## 2021-10-16 NOTE — ED Notes (Addendum)
Patient's clothing, wallet, glasses, home meds, cell phone with patient upon admission. ?

## 2021-10-16 NOTE — ED Provider Notes (Addendum)
? ?[facility] ?Provider Note ? ? ? Event Date/Time  ? First MD Initiated Contact with Patient 10/16/21 1110   ?  (approximate) ? ? ?History  ? ?Fever, Cough, Chills, and Headache ? ? ?HPI ? ?NICHOLLAS TESTER is a 66 y.o. male with pmh COPD, CHF, HTN, tobacco use who presents with cough shortness of breath headache body aches.  Patient was exposed to a friend who had [place] on Wednesday.  Was feeling okay initially had negative home test.  Started to feel bad last night.  Describes cough productive of yellow sputum shortness of breath chest tightness chills decreased appetite and headache.  Headache is improved today.  Chest tightness is constant nonexertional nonpleuritic denies pain just tight feeling.  Thought that his right leg was more swollen than the left had hip surgery 7 weeks ago.  No history of DVT/PE.  He had a positive COVID test at home today. ? ?  ? ?Past Medical History:  ?Diagnosis Date  ? 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 Asthma   ? CHF (congestive heart failure) ([place])   ? COPD (chronic obstructive pulmonary disease) ([place])   ? Dyspnea   ? Hypertension   ? ? ?Patient Active Problem List  ? Diagnosis Date Noted  ? 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 COPD with acute exacerbation ([place]) 05/07/2018  ? Acute on chronic diastolic CHF (congestive heart failure) ([place]) 06/12/2016  ? Pneumonia 06/12/2016  ? Hyperkalemia 06/12/2016  ? Encephalopathy, metabolic 0000000  ? Wide-complex tachycardia 06/12/2016  ? Acute on chronic respiratory failure with hypoxia and hypercapnia ([place]) 06/08/2016  ? COPD exacerbation ([place]) 06/04/2016  ? Diarrhea 03/25/2016  ? Dehydration 03/25/2016  ? Hypokalemia 03/25/2016  ? HTN (hypertension) 03/25/2016  ? COPD (chronic obstructive pulmonary disease) ([place]) 03/25/2016  ? ? ? ?Physical Exam  ?Triage Vital Signs: ?ED Triage Vitals  ?Enc Vitals Group  ?   BP 10/16/21 1108 126/63  ?   Pulse Rate 10/16/21 1108 84  ?   Resp 10/16/21 1108 20   ?   Temp 10/16/21 1108 98.3 ?F (36.8 ?C)  ?   Temp Source 10/16/21 1108 Oral  ?   SpO2 10/16/21 1108 97 %  ?   Weight 10/16/21 1106 220 lb (99.8 kg)  ?   Height 10/16/21 1106 5\' 9"  (1.753 m)  ?   Head Circumference --   ?   Peak Flow --   ?   Pain Score 10/16/21 1105 6  ?   Pain Loc --   ?   Pain Edu? --   ?   Excl. in [place]? --   ? ? ?Most recent vital signs: ?Vitals:  ? 10/16/21 1130 10/16/21 1200  ?BP: 135/64 (!) 155/70  ?Pulse: 80 83  ?Resp:  18  ?Temp:    ?SpO2: 100% 100%  ? ? ? ?General: Awake, no distress.  ?CV:  Good peripheral perfusion.  1+ edema bilaterally, legs are symmetric ?Resp:  Is tachypneic with increased work of breathing when transferring to the bed, he has biphasic wheezing decreased air movement ?Abd:  No distention.  ?Neuro:             Awake, Alert, Oriented x 3  ?Other:   ? ? ?ED Results / Procedures / Treatments  ?Labs ?(all labs ordered are listed, but only abnormal results are displayed) ?Labs Reviewed  ?RESP PANEL BY RT-PCR (FLU A&B, COVID) ARPGX2 - Abnormal; Notable for the following  components:  ?    Result Value  ? SARS Coronavirus 2 by RT PCR POSITIVE (*)   ? All other components within normal limits  ?COMPREHENSIVE METABOLIC PANEL - Abnormal; Notable for the following components:  ? Sodium 134 (*)   ? Creatinine, Ser 1.33 (*)   ? GFR, Estimated 60 (*)   ? All other components within normal limits  ?CBC WITH DIFFERENTIAL/PLATELET - Abnormal; Notable for the following components:  ? MCV 102.9 (*)   ? All other components within normal limits  ?BRAIN NATRIURETIC PEPTIDE - Abnormal; Notable for the following components:  ? B Natriuretic Peptide 104.4 (*)   ? All other components within normal limits  ?PROCALCITONIN  ?TROPONIN I (HIGH SENSITIVITY)  ?TROPONIN I (HIGH SENSITIVITY)  ? ? ? ?EKG ? ?EKG interpretation performed by myself: NSR, nml axis, nml intervals, no acute ischemic changes ? ? ? ?RADIOLOGY ?I reviewed the CXR which does not show any acute cardiopulmonary process; agree with  radiology report  ? ? ? ?PROCEDURES: ? ?Critical Care performed: No ? ?.Critical Care ?Performed by: Rada Hay, MD ?Authorized by: Rada Hay, MD  ? ?Critical care provider statement:  ?  Critical care time (minutes):  30 ?  Critical care was time spent personally by me on the following activities:  Development of treatment plan with patient or surrogate, discussions with consultants, evaluation of patient's response to treatment, examination of patient, ordering and review of laboratory studies, ordering and review of radiographic studies, ordering and performing treatments and interventions, pulse oximetry, re-evaluation of patient's condition and review of old charts ? ?The patient is on the cardiac monitor to evaluate for evidence of arrhythmia and/or significant heart rate changes. ? ? ?MEDICATIONS ORDERED IN ED: ?Medications  ?ipratropium-albuterol (DUONEB) 0.5-2.5 (3) MG/3ML nebulizer solution 3 mL (3 mLs Nebulization Given 10/16/21 1143)  ?ipratropium-albuterol (DUONEB) 0.5-2.5 (3) MG/3ML nebulizer solution 3 mL (3 mLs Nebulization Given 10/16/21 1143)  ?ipratropium-albuterol (DUONEB) 0.5-2.5 (3) MG/3ML nebulizer solution 3 mL (3 mLs Nebulization Given 10/16/21 1143)  ?predniSONE (DELTASONE) tablet 60 mg (60 mg Oral Given 10/16/21 1143)  ?acetaminophen (TYLENOL) tablet 1,000 mg (1,000 mg Oral Given 10/16/21 1207)  ?albuterol (PROVENTIL) (2.5 MG/3ML) 0.083% nebulizer solution 5 mg (5 mg Nebulization Given 10/16/21 1256)  ? ? ? ?IMPRESSION / MDM / ASSESSMENT AND PLAN / ED COURSE  ?I reviewed the triage vital signs and the nursing notes. ?             ?               ? ?Differential diagnosis includes, but is not limited to, COVID-pneumonia, COPD exacerbation, CHF, bacterial pneumonia, pulmonary embolism ? ?The patient is a 66 year old male with COPD/asthma and CHF with preserved EF who presents with chills cough shortness of breath chest tightness headache.  This is in the setting of a COVID  exposure and a positive home COVID test this morning.  His cough is productive of yellow sputum and he is dyspneic on exertion with sats at home in the 80s that he says improved with incentive spirometer use.  Patient is initial sat in triage is 97% however when he is transferred back to the room after walking his initial sat is 84% comes up to 90 when he is sitting at rest placed on 2 L nasal cannula back up to 98%.  He is somewhat dyspneic but not in overt respiratory distress.  He has wheezing biphasic with somewhat decreased air movement.  Does  have 1+ edema bilaterally.  Suspect COVID induced COPD exacerbation versus COVID-pneumonia.  Will check labs chest x-ray and treat for COPD with DuoNebs and steroids.  If sat remains low will need admission. ? ?Patient's chest x-ray does not show any evidence of pneumonia or edema or infiltrate.  Labs are overall reassuring, no leukocytosis, fattening 1.33 baseline around 1 troponin is negative BNP just mildly elevated at 104.  After 3 DuoNebs and steroids patient says he feels improved.  I took him off oxygen and he desatted down to 83% so placed back on 2 L with improvement in sats.  He is continuing to wheeze significantly although air movement is somewhat improved.  We will treat with addition albuterol.  His COVID test is also positive here.  At this point with his ongoing hypoxia and wheezing I think he needs admission.  Discussed with the hospitalist for admission.   ? ?Clinical Course as of 10/16/21 1322  ?Sun Oct 16, 2021  ?1307 SARS Coronavirus 2 by RT PCR(!): POSITIVE [KM]  ?  ?Clinical Course User Index ?[KM] Rada Hay, MD  ? ? ? ?FINAL CLINICAL IMPRESSION(S) / ED DIAGNOSES  ? ?Final diagnoses:  ?COPD exacerbation ([place])  ?COVID-19  ? ? ? ?Rx / DC Orders  ? ?ED Discharge Orders   ? ? None  ? ?  ? ? ? ?Note:  This document was prepared using Dragon voice recognition software and may include unintentional dictation errors. ?  ?Rada Hay,  MD ?10/16/21 1309 ? ?  ?Rada Hay, MD ?10/16/21 1322 ? ?

## 2021-10-16 NOTE — Progress Notes (Signed)
PHARMACIST - PHYSICIAN COMMUNICATION ? ?CONCERNING:  Enoxaparin (Lovenox) for DVT Prophylaxis  ? ? ?RECOMMENDATION: ?Patient was prescribed enoxaparin 40mg  q24 hours for VTE prophylaxis.  ? ?Filed Weights  ? 10/16/21 1106  ?Weight: 99.8 kg (220 lb)  ? ? ?Body mass index is 32.49 kg/m?. ? ?Estimated Creatinine Clearance: 65.3 mL/min (A) (by C-G formula based on SCr of 1.33 mg/dL (H)). ? ? ?Based on [facility] policy patient is candidate for enoxaparin 0.5mg /kg TBW SQ every 24 hours based on BMI being >30. ? ?DESCRIPTION: ?Pharmacy has adjusted enoxaparin dose per [facility] policy. ? ?Patient is now receiving enoxaparin 50 mg every 24 hours  ? ?[facility] ?10/16/2021 ?1:46 PM  ?

## 2021-10-16 NOTE — ED Triage Notes (Signed)
Pt reports was exposed to COVID earlier this week and started with sx's yesterday. Pt reports fever, chills, productive cough, headache and overall not feeling well. Pt reports sputum is greenish yellow in color. Pt concerned because he has asthma ?

## 2021-10-16 NOTE — Subjective & Objective (Signed)
Miguel Howell is a 66 y.o. male with pmh COPD, CHF, HTN, tobacco use who presents with cough shortness of breath headache body aches.  Patient was exposed to a friend who had COVID on Wednesday.  Was feeling okay initially had negative home test.  Started to feel bad last night.  Describes cough productive of yellow sputum shortness of breath chest tightness chills decreased appetite and headache.  Headache is improved today.  He had a positive COVID test at home today. C/o Chest tightness that is nonexertional nonpleuritic Thought that his right leg was more swollen than the left.He had hip surgery 7 weeks ago.  No history of DVT/PE.  Marland Kitchen ?

## 2021-10-16 NOTE — ED Notes (Signed)
Report given to Alric Ran RN ?

## 2021-10-16 NOTE — ED Notes (Signed)
Pt reports taking a home covid test and being positive. Pt states his oxygen level was low last night and he started to feel bad. ?

## 2021-10-16 NOTE — H&P (Signed)
?History and Physical  ? ? ?Miguel MorrowDaniel W Daily [id number] DOB: 08/05/1955 DOA: 10/16/2021 ? ?DOS: the patient was seen and examined on 10/16/2021 ? ?PCP: Marguarite ArbourSparks, Jeffrey D, MD  ? ?Patient coming from: Home ? ?I have personally briefly reviewed patient's old medical records in [facility] ? ?Miguel Howell is a 66 y.o. male with pmh COPD, CHF, HTN, tobacco use who presents with cough shortness of breath headache body aches.  Patient was exposed to a friend who had COVID on Wednesday.  Was feeling okay initially had negative home test.  Started to feel bad last night.  Describes cough productive of yellow sputum shortness of breath chest tightness chills decreased appetite and headache.  Headache is improved today.  He had a positive COVID test at home today. C/o Chest tightness that is nonexertional nonpleuritic Thought that his right leg was more swollen than the left.He had hip surgery 7 weeks ago.  No history of DVT/PE.  . ?  ? ?ED Course: Afebrile, vitals stable. Per EDP exam patient with wheezing. Off O2 oxygen sat dropped to 85%. ABG on oxygen 7.34/79/109, Na 134, Cr 1.33 (baseline 0.9-1.0), BNP 104, CBCD nl, Covid POSITIVE.  CXR w/o acute findings - no PNA. TRH called to admit for management of COPD exacerbation and Covid 19 infection  ? ?Review of Systems:  ?Review of Systems  ?Constitutional:  Positive for chills, fever and malaise/fatigue. Negative for weight loss.  ?HENT: Negative.    ?Eyes: Negative.   ?Respiratory:  Positive for cough, sputum production, shortness of breath and wheezing.   ?Cardiovascular: Negative.   ?Gastrointestinal: Negative.   ?Genitourinary: Negative.   ?Musculoskeletal: Negative.   ?Skin: Negative.   ?Neurological: Negative.   ?Psychiatric/Behavioral: Negative.    ? ?Past Medical History:  ?Diagnosis Date  ? 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 Asthma   ? CHF (congestive heart failure) ([facility])   ? COPD (chronic obstructive pulmonary disease) ([facility])   ? Dyspnea   ? Hypertension    ? ? ?Past Surgical History:  ?Procedure Laterality Date  ? HERNIA REPAIR    ? TOTAL HIP ARTHROPLASTY Right 09/01/2021  ? Procedure: TOTAL HIP ARTHROPLASTY ANTERIOR APPROACH;  Surgeon: Kennedy BuckerMenz, Harrol Novello, MD;  Location: [facility] ORS;  Service: Orthopedics;  Laterality: Right;  ? 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 ? ? reports that he has been smoking cigarettes. He has a 60.00 pack-year smoking history. He has quit using smokeless tobacco. He reports that he does not currently use alcohol. He reports that he does not use drugs. ? ?No Known Allergies ? ?Family History  ?Problem Relation Age of Onset  ? Diabetes Mother   ? Diabetes Father   ? COPD Father   ? Heart attack Father   ? ? ?Prior to Admission medications   ?Medication Sig Start Date End Date Taking? Authorizing Provider  ?ADVAIR HFA 230-21 MCG/ACT inhaler Inhale 2 puffs into the lungs 2 (two) times daily. 07/30/21   [provider]  ?albuterol (PROVENTIL HFA;VENTOLIN HFA) 108 (90 Base) MCG/ACT inhaler Inhale 2 puffs into the lungs every 4 (four) hours as needed for wheezing or shortness of breath.    [provider]  ?ARGININE PO Take 1 capsule by mouth 2 (two) times daily. With citrulline    [provider]  ?aspirin EC 81 MG tablet Take 81 mg by mouth daily.    [provider]  ?calcium carbonate (TUMS - DOSED IN MG ELEMENTAL CALCIUM) 500 MG  chewable tablet Chew 1 tablet by mouth daily as needed for indigestion or heartburn.    [provider]  ?carvedilol (COREG) 6.25 MG tablet Take 3.125 mg by mouth 2 (two) times daily.     [provider]  ?diazepam (VALIUM) 10 MG tablet Take 10 mg by mouth at bedtime as needed for anxiety or sleep.     [provider]  ?docusate sodium (COLACE) 100 MG capsule Take 1 capsule (100 mg total) by mouth 2 (two) times daily. 09/02/21   Evon Slack, PA-C  ?enoxaparin (LOVENOX) 40 MG/0.4ML injection Inject 0.4 mLs (40 mg total) into the skin daily  for 14 days. 09/03/21 09/17/21  Evon Slack, PA-C  ?furosemide (LASIX) 40 MG tablet Take 1 tablet (40 mg total) by mouth daily. 06/12/16   [name], MD  ?methocarbamol (ROBAXIN) 500 MG tablet Take 1 tablet (500 mg total) by mouth every 6 (six) hours as needed for muscle spasms. 09/02/21   Evon Slack, PA-C  ?montelukast (SINGULAIR) 10 MG tablet Take 10 mg by mouth daily.    [provider]  ?nabumetone (RELAFEN) 500 MG tablet Take 500 mg by mouth 2 (two) times daily. 07/28/21   [provider]  ?oxyCODONE (OXY IR/ROXICODONE) 5 MG immediate release tablet Take 1-2 tablets (5-10 mg total) by mouth every 4 (four) hours as needed for moderate pain (pain score 4-6). 09/02/21   Evon Slack, PA-C  ?potassium chloride (K-DUR,KLOR-CON) 10 MEQ tablet Take 1 tablet (10 mEq total) by mouth 2 (two) times daily. 06/12/16   [name], MD  ?PSYLLIUM HUSK PO Take 2 capsules by mouth 2 (two) times daily.    [provider]  ?tadalafil (CIALIS) 20 MG tablet Take 20 mg by mouth See admin instructions. Take 20 mg every 3 days as needed for ED 07/28/21   [provider]  ?Tiotropium Bromide Monohydrate (SPIRIVA RESPIMAT) 2.5 MCG/ACT AERS Inhale 1 puff into the lungs 2 (two) times daily.    [provider]  ?traMADol (ULTRAM) 50 MG tablet Take 1 tablet (50 mg total) by mouth every 6 (six) hours as needed. 09/02/21   Evon Slack, PA-C  ? ? ?Physical Exam: ?Vitals:  ? 10/16/21 1200 10/16/21 1330 10/16/21 1340 10/16/21 1400  ?BP: (!) 155/70 (!) 145/65  (!) 166/79  ?Pulse: 83 92 86 90  ?Resp: 18     ?Temp:      ?TempSrc:      ?SpO2: 100% (!) 86% 100% 100%  ?Weight:      ?Height:      ? ? ?Physical Exam ?Vitals and nursing note reviewed.  ?Constitutional:   ?   Appearance: He is well-developed. He is obese.  ?HENT:  ?   Head: Normocephalic and atraumatic.  ?Eyes:  ?   General: No scleral icterus. ?   Extraocular Movements: Extraocular movements intact.  ?   Pupils: Pupils are  equal, round, and reactive to light.  ?Cardiovascular:  ?   Rate and Rhythm: Normal rate and regular rhythm.  ?Pulmonary:  ?   Breath sounds: Wheezing present.  ?   Comments: Prolonged expiratory phase. Expiratory wheezing. No Rhonchi, no rales. No accessory muscle engagement. ?Abdominal:  ?   General: Bowel sounds are normal.  ?   Palpations: Abdomen is soft.  ?Musculoskeletal:  ?   Cervical back: Normal range of motion and neck supple.  ?   Comments: S/p Right THR 7 weeks ago - good movement.   ?Skin: ?  General: Skin is warm and dry.  ?   Comments: Well healed anterior right THR scar.  ?Neurological:  ?   Mental Status: He is alert and oriented to person, place, and time.  ?Psychiatric:     ?   Mood and Affect: Mood normal.     ?   Speech: Speech normal.     ?   Behavior: Behavior normal.  ?  ? ?Labs on Admission: I have personally reviewed following labs and imaging studies ? ?CBC: ?Recent Labs  ?Lab 10/16/21 ?1129  ?WBC 4.3  ?NEUTROABS 2.7  ?HGB 14.4  ?HCT 45.7  ?MCV 102.9*  ?PLT 154  ? ?Basic Metabolic Panel: ?Recent Labs  ?Lab 10/16/21 ?1129  ?NA 134*  ?K 4.2  ?CL 99  ?CO2 30  ?GLUCOSE 93  ?BUN 16  ?CREATININE 1.33*  ?CALCIUM 9.4  ? ?GFR: ?Estimated Creatinine Clearance: 65.3 mL/min (A) (by C-G formula based on SCr of 1.33 mg/dL (H)). ?Liver Function Tests: ?Recent Labs  ?Lab 10/16/21 ?1129  ?AST 20  ?ALT 14  ?ALKPHOS 102  ?BILITOT 0.9  ?PROT 7.2  ?ALBUMIN 3.8  ? ?No results for input(s): LIPASE, AMYLASE in the last 168 hours. ?No results for input(s): AMMONIA in the last 168 hours. ?Coagulation Profile: ?No results for input(s): INR, PROTIME in the last 168 hours. ?Cardiac Enzymes: ?No results for input(s): CKTOTAL, CKMB, CKMBINDEX, TROPONINI in the last 168 hours. ?BNP (last 3 results) ?No results for input(s): PROBNP in the last 8760 hours. ?HbA1C: ?No results for input(s): HGBA1C in the last 72 hours. ?CBG: ?No results for input(s): GLUCAP in the last 168 hours. ?Lipid Profile: ?No results for input(s):  CHOL, HDL, LDLCALC, TRIG, CHOLHDL, LDLDIRECT in the last 72 hours. ?Thyroid Function Tests: ?No results for input(s): TSH, T4TOTAL, FREET4, T3FREE, THYROIDAB in the last 72 hours. ?Anemia Panel: ?No result

## 2021-10-16 NOTE — Assessment & Plan Note (Addendum)
No evidence of PNA on CXR.  Started on Paxlovid.  Be discharged on his remaining doses, 2-1/2 more days.  Advised to stay in isolation for 10 days after his positive test on admission. ?

## 2021-10-16 NOTE — Assessment & Plan Note (Addendum)
--  Desaturates to <85% on room air, needed 2L O2. ?Over the next few days, patient was slowly getting better.  Oxygen saturations at 89 to 91% with ambulating, but at rest, 93% on room air.  Patient eager to leave by 5/17 and did not want to stay longer.  Would not qualify for home oxygen. ?

## 2021-10-17 DIAGNOSIS — J441 Chronic obstructive pulmonary disease with (acute) exacerbation: Secondary | ICD-10-CM | POA: Diagnosis not present

## 2021-10-17 DIAGNOSIS — J9612 Chronic respiratory failure with hypercapnia: Secondary | ICD-10-CM | POA: Insufficient documentation

## 2021-10-17 LAB — EXPECTORATED SPUTUM ASSESSMENT W GRAM STAIN, RFLX TO RESP C: Special Requests: NORMAL

## 2021-10-17 MED ORDER — PREDNISONE 20 MG PO TABS
40.0000 mg | ORAL_TABLET | Freq: Every day | ORAL | Status: DC
  Administered 2021-10-17 – 2021-10-19 (×3): 40 mg via ORAL
  Filled 2021-10-17 (×3): qty 2

## 2021-10-17 MED ORDER — NIRMATRELVIR/RITONAVIR (PAXLOVID) TABLET (RENAL DOSING)
2.0000 | ORAL_TABLET | Freq: Two times a day (BID) | ORAL | Status: DC
  Administered 2021-10-17 – 2021-10-19 (×5): 2 via ORAL
  Filled 2021-10-17: qty 20

## 2021-10-17 NOTE — TOC Progression Note (Signed)
Transition of Care (TOC) - Progression Note  ? ? ?Patient Details  ?Name: Miguel Howell ?MRN: [id number] ?Date of Birth: 1956-01-09 ? ?Transition of Care (TOC) CM/SW Contact  ?Caryn Section, RN ?Phone Number: ?10/17/2021, 4:53 PM ? ?Clinical Narrative:   Patient was 80% sat without O2.  Team will evaluate the need for home O2 tomorrow. ? ? ? ?  ?  ? ?Expected Discharge Plan and Services ?  ?  ?  ?  ?  ?                ?  ?  ?  ?  ?  ?  ?  ?  ?  ?  ? ? ?Social Determinants of Health (SDOH) Interventions ?  ? ?Readmission Risk Interventions ?   ? View : No data to display.  ?  ?  ?  ? ? ?

## 2021-10-17 NOTE — Progress Notes (Signed)
?  Progress Note ? ? ?Patient: Miguel Howell [id number] DOB: [birth year] DOA: 10/16/2021     1 ?DOS: the patient was seen and examined on 10/17/2021 ?  ?Brief hospital course: ?No notes on file ? ?Assessment and Plan: ?* COPD with acute exacerbation (HCC) ?--started on IV solumedrol with rapid improvement in respiratory symptoms ?Plan: ?--transition to prednisone ?--cont scheduled albuterol inhaler ?--cont home daily broncodilators ? ? ?COVID-19 virus infection ?No evidence of PNA on CXR. ?Plan: ?--start paxlovid ? ?Acute hypoxemic respiratory failure (HCC) ?--Desaturates to <85% on room air, needed 2L O2. ?--2/2 COVID infection and COPD exacerbation ?Plan: ?--treat both ?--Continue supplemental O2 to keep sats >=90%, wean as tolerated ? ?HTN (hypertension) ?Continue home coreg ? ? ? ? ?  ? ?Subjective:  ?Pt reported feeling much better today.  Appetite improved.  Still needing 2L O2. ? ? ?Physical Exam: ? ?Constitutional: NAD, AAOx3 ?HEENT: conjunctivae and lids normal, EOMI ?CV: No cyanosis.   ?RESP: mild wheezes, on 2L O2 ?Extremities: No effusions, edema in BLE ?SKIN: warm, dry ?Neuro: II - XII grossly intact.   ?Psych: Normal mood and affect.  Appropriate judgement and reason ? ? ?Data Reviewed: ? ?Family Communication:  ? ?Disposition: ?Status is: Inpatient ? ? Planned Discharge Destination: Home ? ? ? ?Time spent: 50 minutes ? ?Author: ?Darlin Priestly, MD ?10/17/2021 6:21 PM ? ?For on call review [email or web address]  ?

## 2021-10-18 DIAGNOSIS — N179 Acute kidney failure, unspecified: Secondary | ICD-10-CM

## 2021-10-18 DIAGNOSIS — J441 Chronic obstructive pulmonary disease with (acute) exacerbation: Secondary | ICD-10-CM | POA: Diagnosis not present

## 2021-10-18 DIAGNOSIS — I5032 Chronic diastolic (congestive) heart failure: Secondary | ICD-10-CM | POA: Diagnosis present

## 2021-10-18 LAB — CBC
HCT: 45.4 % (ref 39.0–52.0)
Hemoglobin: 14.1 g/dL (ref 13.0–17.0)
MCH: 31.6 pg (ref 26.0–34.0)
MCHC: 31.1 g/dL (ref 30.0–36.0)
MCV: 101.8 fL — ABNORMAL HIGH (ref 80.0–100.0)
Platelets: 164 10*3/uL (ref 150–400)
RBC: 4.46 MIL/uL (ref 4.22–5.81)
RDW: 12.8 % (ref 11.5–15.5)
WBC: 6 10*3/uL (ref 4.0–10.5)
nRBC: 0 % (ref 0.0–0.2)

## 2021-10-18 LAB — BASIC METABOLIC PANEL
Anion gap: 6 (ref 5–15)
BUN: 19 mg/dL (ref 8–23)
CO2: 31 mmol/L (ref 22–32)
Calcium: 9 mg/dL (ref 8.9–10.3)
Chloride: 97 mmol/L — ABNORMAL LOW (ref 98–111)
Creatinine, Ser: 0.98 mg/dL (ref 0.61–1.24)
GFR, Estimated: >60 mL/min (ref >60)
Glucose, Bld: 143 mg/dL — ABNORMAL HIGH (ref 70–99)
Potassium: 4.7 mmol/L (ref 3.5–5.1)
Sodium: 134 mmol/L — ABNORMAL LOW (ref 135–145)

## 2021-10-18 LAB — MAGNESIUM: Magnesium: 2.3 mg/dL (ref 1.7–2.4)

## 2021-10-18 NOTE — Hospital Course (Addendum)
Miguel Howell is a 66 y.o. male with pmh COPD, dCHF, HTN, tobacco use who presented to emergency room on 5/14 with cough, shortness of breath, headache and body aches.  Patient had been recently exposed to COVID.  Patient found to be COVID-positive which was felt to have set off a mild COPD exacerbation.  Also found to be mildly hypoxic requiring 2 L nasal cannula oxygen. ? ?Patient started on Paxlovid plus prednisone.  Also found to have mild acute kidney injury on admission and started on some gentle IV fluids. ?

## 2021-10-18 NOTE — Assessment & Plan Note (Addendum)
--  Cr 1.33 on presentation.  Secondary to dehydration.  Improved to 0.98 with IV fluids. ?--hold home Lasix ?

## 2021-10-18 NOTE — Progress Notes (Addendum)
?  Progress Note ? ? ?Patient: Miguel Howell DOB: 1956-03-26 DOA: 10/16/2021     2 ?DOS: the patient was seen and examined on 10/18/2021 ?  ?Brief hospital course: ?Miguel Howell is a 66 y.o. male with pmh COPD, dCHF, HTN, tobacco use who presented with cough shortness of breath headache body aches.  Patient was exposed to a friend who had COVID.   ? ?Pt was hypoxic requiring 2L O2.  Found to be COVID positive and also started tx for COPD exacerbation. ? ?Assessment and Plan: ?* COPD with acute exacerbation (HCC) ?--started on IV solumedrol with rapid improvement in respiratory symptoms, transitioned to prednisone ?Plan: ?--cont prednisone 40 mg daily ?--cont scheduled albuterol inhaler ?--cont home daily broncodilators ? ? ?COVID-19 virus infection ?No evidence of PNA on CXR. ?Plan: ?--cont paxlovid ? ?Acute hypoxemic respiratory failure (HCC) ?--Desaturates to <85% on room air, needed 2L O2. ?--2/2 COVID infection and COPD exacerbation--as of today, room air at rest remained 91. ambulating room air and dropped to 84 ?Plan: ?--treat both ?--Continue supplemental O2 to keep sats >=90%, wean as tolerated ? ?HTN (hypertension) ?Continue home coreg ?--hold home Lasix ? ?Chronic diastolic CHF (congestive heart failure) (HCC) ?--stable. ?--hold home lasix 2/2 AKI ? ?AKI (acute kidney injury) (HCC) ?--Cr 1.33 on presentation.  Improved to 0.98. ?--hold home Lasix ? ? ? ? ?  ? ?Subjective:  ?Pt reported breathing improved, however, room air at rest remained 91. ambulating room air and dropped to 84. ? ? ?Physical Exam: ? ?Constitutional: NAD, AAOx3 ?HEENT: conjunctivae and lids normal, EOMI ?CV: No cyanosis.   ?RESP: normal respiratory effort, on 2L ?Extremities: No effusions, edema in BLE ?SKIN: warm, dry ?Neuro: II - XII grossly intact.   ?Psych: Normal mood and affect.  Appropriate judgement and reason ? ? ? ?Data Reviewed: ? ?Family Communication:  ? ?Disposition: ?Status is: Inpatient ? ? Planned Discharge  Destination: Home ? ? ? ?Time spent: 50 minutes ? ?Author: ?Darlin Priestly, MD ?10/18/2021 7:43 PM ? ?For on call review [email or web address]  ?

## 2021-10-18 NOTE — Assessment & Plan Note (Signed)
--  stable. ?--hold home lasix 2/2 AKI ?

## 2021-10-19 DIAGNOSIS — N179 Acute kidney failure, unspecified: Secondary | ICD-10-CM

## 2021-10-19 DIAGNOSIS — U071 COVID-19: Secondary | ICD-10-CM | POA: Diagnosis not present

## 2021-10-19 DIAGNOSIS — J9601 Acute respiratory failure with hypoxia: Secondary | ICD-10-CM

## 2021-10-19 DIAGNOSIS — E669 Obesity, unspecified: Secondary | ICD-10-CM | POA: Diagnosis present

## 2021-10-19 DIAGNOSIS — J441 Chronic obstructive pulmonary disease with (acute) exacerbation: Secondary | ICD-10-CM | POA: Diagnosis not present

## 2021-10-19 LAB — BASIC METABOLIC PANEL
Anion gap: 8 (ref 5–15)
BUN: 16 mg/dL (ref 8–23)
CO2: 33 mmol/L — ABNORMAL HIGH (ref 22–32)
Calcium: 9.2 mg/dL (ref 8.9–10.3)
Chloride: 96 mmol/L — ABNORMAL LOW (ref 98–111)
Creatinine, Ser: 0.97 mg/dL (ref 0.61–1.24)
GFR, Estimated: >60 mL/min (ref >60)
Glucose, Bld: 144 mg/dL — ABNORMAL HIGH (ref 70–99)
Potassium: 4.3 mmol/L (ref 3.5–5.1)
Sodium: 137 mmol/L (ref 135–145)

## 2021-10-19 LAB — CBC
HCT: 44.5 % (ref 39.0–52.0)
Hemoglobin: 13.9 g/dL (ref 13.0–17.0)
MCH: 32.3 pg (ref 26.0–34.0)
MCHC: 31.2 g/dL (ref 30.0–36.0)
MCV: 103.2 fL — ABNORMAL HIGH (ref 80.0–100.0)
Platelets: 144 10*3/uL — ABNORMAL LOW (ref 150–400)
RBC: 4.31 MIL/uL (ref 4.22–5.81)
RDW: 12.5 % (ref 11.5–15.5)
WBC: 5.3 10*3/uL (ref 4.0–10.5)
nRBC: 0 % (ref 0.0–0.2)

## 2021-10-19 LAB — MAGNESIUM: Magnesium: 2.3 mg/dL (ref 1.7–2.4)

## 2021-10-19 MED ORDER — PREDNISONE 20 MG PO TABS
40.0000 mg | ORAL_TABLET | Freq: Every day | ORAL | 0 refills | Status: AC
Start: 2021-10-20 — End: 2021-10-24

## 2021-10-19 MED ORDER — NIRMATRELVIR/RITONAVIR (PAXLOVID) TABLET (RENAL DOSING): 2.0000 | ORAL_TABLET | Freq: Two times a day (BID) | ORAL | 0 refills | Status: AC

## 2021-10-19 NOTE — Assessment & Plan Note (Signed)
Meets criteria with BMI greater than 30 ?

## 2021-10-19 NOTE — Discharge Summary (Signed)
?Physician Discharge Summary ?  ?Patient: Miguel Howell MRN: [id number] DOB: [birth year]  ?Admit date:     10/16/2021  ?Discharge date: 10/19/21  ?Discharge Physician: Hollice Espy  ? ?PCP: Marguarite Arbour, MD  ? ?Recommendations at discharge:  ? ?New medication: Paxlovid twice daily for 5 more doses ?New medication: Prednisone 40 mg p.o. daily x5 days ?Patient will follow-up with his PCP in 2 weeks, ?Patient advised to stay in respiratory isolation for 10 days after his positive test ? ?Discharge Diagnoses: ?Principal Problem: ?  COPD with acute exacerbation (HCC) ?Active Problems: ?  COVID-19 virus infection ?  HTN (hypertension) ?  Chronic diastolic CHF (congestive heart failure) (HCC) ?  Obesity (BMI 30-39.9) ? ?Resolved Problems: ?  Acute hypoxemic respiratory failure (HCC) ?  AKI (acute kidney injury) (HCC) ?  COPD exacerbation (HCC) ? ?Hospital Course: ?Miguel Howell is a 66 y.o. male with pmh COPD, dCHF, HTN, tobacco use who presented to emergency room on 5/14 with cough, shortness of breath, headache and body aches.  Patient had been recently exposed to COVID.  Patient found to be COVID-positive which was felt to have set off a mild COPD exacerbation.  Also found to be mildly hypoxic requiring 2 L nasal cannula oxygen. ? ?Patient started on Paxlovid plus prednisone.  Also found to have mild acute kidney injury on admission and started on some gentle IV fluids. ? ?Assessment and Plan: ?* COPD with acute exacerbation (HCC) ?--started on IV solumedrol with rapid improvement in respiratory symptoms, transitioned to prednisone.  Nebulizer treatments during hospitalization.  Continue home inhalers upon discharge ? ?COVID-19 virus infection ?No evidence of PNA on CXR.  Started on Paxlovid.  Be discharged on his remaining doses, 2-1/2 more days.  Advised to stay in isolation for 10 days after his positive test on admission. ? ?Acute hypoxemic respiratory failure (HCC)-resolved as of 10/19/2021 ?--Desaturates  to <85% on room air, needed 2L O2. ?Over the next few days, patient was slowly getting better.  Oxygen saturations at 89 to 91% with ambulating, but at rest, 93% on room air.  Patient eager to leave by 5/17 and did not want to stay longer.  Would not qualify for home oxygen. ? ?HTN (hypertension) ?Continue home coreg ?--hold home Lasix ? ?AKI (acute kidney injury) (HCC)-resolved as of 10/19/2021 ?--Cr 1.33 on presentation.  Secondary to dehydration.  Improved to 0.98 with IV fluids. ?--hold home Lasix ? ?Chronic diastolic CHF (congestive heart failure) (HCC) ?--stable. ?--hold home lasix 2/2 AKI ? ?Obesity (BMI 30-39.9) ?Meets criteria with BMI greater than 30 ? ? ? ? ?  ? ? ?Consultants: None ?Procedures performed: None ?Disposition: Home ?Diet recommendation:  ?Discharge Diet Orders (From admission, onward)  ? ?  Start     Ordered  ? 10/19/21 0000  Diet - low sodium heart healthy       ? 10/19/21 1248  ? ?  ?  ? ?  ? ?Cardiac diet ?DISCHARGE MEDICATION: ?Allergies as of 10/19/2021   ?No Known Allergies ?  ? ?  ?Medication List  ?  ? ?STOP taking these medications   ? ?enoxaparin 40 MG/0.4ML injection ?Commonly known as: LOVENOX ?  ?methocarbamol 500 MG tablet ?Commonly known as: ROBAXIN ?  ?oxyCODONE 5 MG immediate release tablet ?Commonly known as: Oxy IR/ROXICODONE ?  ?traMADol 50 MG tablet ?Commonly known as: ULTRAM ?  ? ?  ? ?TAKE these medications   ? ?acetaminophen 500 MG tablet ?Commonly known as: TYLENOL ?Take 500  mg by mouth every 6 (six) hours as needed for fever, headache, moderate pain or mild pain. ?  ?Advair HFA 230-21 MCG/ACT inhaler ?Generic drug: fluticasone-salmeterol ?Inhale 2 puffs into the lungs 2 (two) times daily. ?  ?albuterol 108 (90 Base) MCG/ACT inhaler ?Commonly known as: VENTOLIN HFA ?Inhale 2 puffs into the lungs every 4 (four) hours as needed for wheezing or shortness of breath. ?  ?ARGININE PO ?Take 1 capsule by mouth 2 (two) times daily. With citrulline ?  ?aspirin EC 81 MG  tablet ?Take 81 mg by mouth daily. ?  ?calcium carbonate 500 MG chewable tablet ?Commonly known as: TUMS - dosed in mg elemental calcium ?Chew 1 tablet by mouth daily as needed for indigestion or heartburn. ?  ?carvedilol 6.25 MG tablet ?Commonly known as: COREG ?Take 3.125 mg by mouth 2 (two) times daily. ?  ?diazepam 10 MG tablet ?Commonly known as: VALIUM ?Take 10 mg by mouth at bedtime as needed for anxiety or sleep. ?  ?docusate sodium 100 MG capsule ?Commonly known as: COLACE ?Take 1 capsule (100 mg total) by mouth 2 (two) times daily. ?  ?furosemide 40 MG tablet ?Commonly known as: LASIX ?Take 1 tablet (40 mg total) by mouth daily. ?  ?montelukast 10 MG tablet ?Commonly known as: SINGULAIR ?Take 10 mg by mouth daily. ?  ?nabumetone 500 MG tablet ?Commonly known as: RELAFEN ?Take 500 mg by mouth 2 (two) times daily. ?  ?nirmatrelvir/ritonavir EUA (renal dosing) 10 x 150 MG & 10 x 100MG  Tabs ?Commonly known as: PAXLOVID ?Take 2 tablets by mouth 2 (two) times daily for 3 days. Patient GFR is >60 Take nirmatrelvir (150 mg) one tablet twice daily for 5 days and ritonavir (100 mg) one tablet twice daily for 3 more days. ?  ?potassium chloride 10 MEQ tablet ?Commonly known as: KLOR-CON M ?Take 1 tablet (10 mEq total) by mouth 2 (two) times daily. ?  ?predniSONE 20 MG tablet ?Commonly known as: DELTASONE ?Take 2 tablets (40 mg total) by mouth daily with breakfast for 4 days. ?Start taking on: Oct 20, 2021 ?  ?PSYLLIUM HUSK PO ?Take 2 capsules by mouth 2 (two) times daily. ?  ?Spiriva Respimat 2.5 MCG/ACT Aers ?Generic drug: Tiotropium Bromide Monohydrate ?Inhale 1 puff into the lungs 2 (two) times daily. ?  ?tadalafil 20 MG tablet ?Commonly known as: CIALIS ?Take 20 mg by mouth See admin instructions. Take 20 mg every 3 days as needed for ED ?  ? ?  ? ? Follow-up Information   ? ? Oct 22, 2021, MD Follow up in 2 week(s).   ?Specialty: Internal Medicine ?Contact information: ?[address] ?[facility]  [place] ?[place] ?[phone] ? ? ?  ?  ? ?  ?  ? ?  ? ?Discharge Exam: ?Filed Weights  ? 10/16/21 1106 10/16/21 1600  ?Weight: 99.8 kg 97.5 kg  ? ?General: Alert and oriented x3 ?Lungs: Clear to auscultation bilaterally ? ?Condition at discharge: improving ? ?The results of significant diagnostics from this hospitalization (including imaging, microbiology, ancillary and laboratory) are listed below for reference.  ? ?Imaging Studies: ?DG Chest Port 1 View ? ?Result Date: 10/16/2021 ?CLINICAL DATA:  Fever chills and productive cough. EXAM: PORTABLE CHEST 1 VIEW COMPARISON:  05/07/2018 FINDINGS: The cardiac silhouette, mediastinal and hilar contours are within normal limits and stable. The lungs are clear of an acute process. No infiltrates or effusions. No pulmonary lesions or pneumothorax. The bony thorax is intact. IMPRESSION: No acute cardiopulmonary  findings. Electronically Signed   By: Rudie MeyerP.  Gallerani M.D.   On: 10/16/2021 11:30   ? ?Microbiology: ?Results for orders placed or performed during the hospital encounter of 10/16/21  ?Resp Panel by RT-PCR (Flu A&B, Covid) Nasopharyngeal Swab     Status: Abnormal  ? Collection Time: 10/16/21 11:29 AM  ? Specimen: Nasopharyngeal Swab; Nasopharyngeal(NP) swabs in vial transport medium  ?Result Value Ref Range Status  ? SARS Coronavirus 2 by RT PCR POSITIVE (A) NEGATIVE Final  ?  Comment: (NOTE) ?SARS-CoV-2 target nucleic acids are DETECTED. ? ?The SARS-CoV-2 RNA is generally detectable in upper respiratory ?specimens during the acute phase of infection. Positive results are ?indicative of the presence of the identified virus, but do not rule ?out bacterial infection or co-infection with other pathogens not ?detected by the test. Clinical correlation with patient history and ?other diagnostic information is necessary to determine patient ?infection status. The expected result is Negative. ? ?Fact Sheet for  Patients: ?BloggerCourse.comhttps://www.fda.gov/media/152166/download ? ?Fact Sheet for Healthcare Providers: ?SeriousBroker.ithttps://www.fda.gov/media/152162/download ? ?This test is not yet approved or cleared by the [place] FDA and  ?has been authorized for detection and/or diagnosis of S

## 2021-10-19 NOTE — Discharge Instructions (Signed)
Stay in isolation for 10 days. ?

## 2021-10-20 LAB — CULTURE, RESPIRATORY W GRAM STAIN

## 2022-04-25 DIAGNOSIS — Z79899 Other long term (current) drug therapy: Secondary | ICD-10-CM | POA: Diagnosis not present

## 2022-04-25 DIAGNOSIS — I10 Essential (primary) hypertension: Secondary | ICD-10-CM | POA: Diagnosis not present

## 2022-04-25 DIAGNOSIS — E782 Mixed hyperlipidemia: Secondary | ICD-10-CM | POA: Diagnosis not present

## 2022-04-25 DIAGNOSIS — E118 Type 2 diabetes mellitus with unspecified complications: Secondary | ICD-10-CM | POA: Diagnosis not present

## 2022-05-02 DIAGNOSIS — J431 Panlobular emphysema: Secondary | ICD-10-CM | POA: Diagnosis not present

## 2022-05-02 DIAGNOSIS — I420 Dilated cardiomyopathy: Secondary | ICD-10-CM | POA: Diagnosis not present

## 2022-05-02 DIAGNOSIS — J449 Chronic obstructive pulmonary disease, unspecified: Secondary | ICD-10-CM | POA: Diagnosis not present

## 2022-05-02 DIAGNOSIS — E118 Type 2 diabetes mellitus with unspecified complications: Secondary | ICD-10-CM | POA: Diagnosis not present

## 2022-05-02 DIAGNOSIS — Z1211 Encounter for screening for malignant neoplasm of colon: Secondary | ICD-10-CM | POA: Diagnosis not present

## 2022-05-02 DIAGNOSIS — Z79899 Other long term (current) drug therapy: Secondary | ICD-10-CM | POA: Diagnosis not present

## 2022-05-02 DIAGNOSIS — Z720 Tobacco use: Secondary | ICD-10-CM | POA: Diagnosis not present

## 2022-05-02 DIAGNOSIS — E782 Mixed hyperlipidemia: Secondary | ICD-10-CM | POA: Diagnosis not present

## 2022-05-02 DIAGNOSIS — Z125 Encounter for screening for malignant neoplasm of prostate: Secondary | ICD-10-CM | POA: Diagnosis not present

## 2022-05-02 DIAGNOSIS — I10 Essential (primary) hypertension: Secondary | ICD-10-CM | POA: Diagnosis not present

## 2022-05-02 DIAGNOSIS — R0602 Shortness of breath: Secondary | ICD-10-CM | POA: Diagnosis not present

## 2022-05-02 DIAGNOSIS — E538 Deficiency of other specified B group vitamins: Secondary | ICD-10-CM | POA: Diagnosis not present

## 2022-05-02 DIAGNOSIS — Z0000 Encounter for general adult medical examination without abnormal findings: Secondary | ICD-10-CM | POA: Diagnosis not present

## 2022-05-04 ENCOUNTER — Other Ambulatory Visit: Payer: Self-pay | Admitting: Internal Medicine

## 2022-05-04 DIAGNOSIS — M7989 Other specified soft tissue disorders: Secondary | ICD-10-CM

## 2022-05-04 DIAGNOSIS — Z0000 Encounter for general adult medical examination without abnormal findings: Secondary | ICD-10-CM

## 2022-05-16 DIAGNOSIS — Z1211 Encounter for screening for malignant neoplasm of colon: Secondary | ICD-10-CM | POA: Diagnosis not present

## 2022-06-08 DIAGNOSIS — R0602 Shortness of breath: Secondary | ICD-10-CM | POA: Diagnosis not present

## 2022-06-12 ENCOUNTER — Ambulatory Visit: Payer: 59 | Attending: Internal Medicine

## 2022-06-22 ENCOUNTER — Ambulatory Visit: Payer: 59 | Attending: Internal Medicine

## 2022-10-25 DIAGNOSIS — E118 Type 2 diabetes mellitus with unspecified complications: Secondary | ICD-10-CM | POA: Diagnosis not present

## 2022-10-25 DIAGNOSIS — Z79899 Other long term (current) drug therapy: Secondary | ICD-10-CM | POA: Diagnosis not present

## 2022-10-25 DIAGNOSIS — E782 Mixed hyperlipidemia: Secondary | ICD-10-CM | POA: Diagnosis not present

## 2022-10-25 DIAGNOSIS — I10 Essential (primary) hypertension: Secondary | ICD-10-CM | POA: Diagnosis not present

## 2022-10-25 DIAGNOSIS — Z125 Encounter for screening for malignant neoplasm of prostate: Secondary | ICD-10-CM | POA: Diagnosis not present

## 2022-11-01 DIAGNOSIS — E118 Type 2 diabetes mellitus with unspecified complications: Secondary | ICD-10-CM | POA: Diagnosis not present

## 2022-11-01 DIAGNOSIS — Z79899 Other long term (current) drug therapy: Secondary | ICD-10-CM | POA: Diagnosis not present

## 2022-11-01 DIAGNOSIS — I10 Essential (primary) hypertension: Secondary | ICD-10-CM | POA: Diagnosis not present

## 2022-11-01 DIAGNOSIS — J449 Chronic obstructive pulmonary disease, unspecified: Secondary | ICD-10-CM | POA: Diagnosis not present

## 2022-11-01 DIAGNOSIS — E782 Mixed hyperlipidemia: Secondary | ICD-10-CM | POA: Diagnosis not present

## 2022-11-01 DIAGNOSIS — J431 Panlobular emphysema: Secondary | ICD-10-CM | POA: Diagnosis not present

## 2022-11-01 DIAGNOSIS — Z87891 Personal history of nicotine dependence: Secondary | ICD-10-CM | POA: Diagnosis not present

## 2023-01-18 DIAGNOSIS — R319 Hematuria, unspecified: Secondary | ICD-10-CM | POA: Diagnosis not present

## 2023-01-31 DIAGNOSIS — R319 Hematuria, unspecified: Secondary | ICD-10-CM | POA: Diagnosis not present

## 2023-02-06 DIAGNOSIS — R310 Gross hematuria: Secondary | ICD-10-CM | POA: Diagnosis not present

## 2023-04-30 DIAGNOSIS — E118 Type 2 diabetes mellitus with unspecified complications: Secondary | ICD-10-CM | POA: Diagnosis not present

## 2023-04-30 DIAGNOSIS — E782 Mixed hyperlipidemia: Secondary | ICD-10-CM | POA: Diagnosis not present

## 2023-04-30 DIAGNOSIS — I10 Essential (primary) hypertension: Secondary | ICD-10-CM | POA: Diagnosis not present

## 2023-04-30 DIAGNOSIS — Z79899 Other long term (current) drug therapy: Secondary | ICD-10-CM | POA: Diagnosis not present

## 2023-05-07 DIAGNOSIS — Z1211 Encounter for screening for malignant neoplasm of colon: Secondary | ICD-10-CM | POA: Diagnosis not present

## 2023-05-07 DIAGNOSIS — Z0000 Encounter for general adult medical examination without abnormal findings: Secondary | ICD-10-CM | POA: Diagnosis not present

## 2023-05-07 DIAGNOSIS — E782 Mixed hyperlipidemia: Secondary | ICD-10-CM | POA: Diagnosis not present

## 2023-05-07 DIAGNOSIS — I10 Essential (primary) hypertension: Secondary | ICD-10-CM | POA: Diagnosis not present

## 2023-05-07 DIAGNOSIS — E118 Type 2 diabetes mellitus with unspecified complications: Secondary | ICD-10-CM | POA: Diagnosis not present

## 2023-05-07 DIAGNOSIS — Z6839 Body mass index (BMI) 39.0-39.9, adult: Secondary | ICD-10-CM | POA: Diagnosis not present

## 2023-05-07 DIAGNOSIS — I420 Dilated cardiomyopathy: Secondary | ICD-10-CM | POA: Diagnosis not present

## 2023-05-07 DIAGNOSIS — E66812 Obesity, class 2: Secondary | ICD-10-CM | POA: Diagnosis not present

## 2023-05-07 DIAGNOSIS — J431 Panlobular emphysema: Secondary | ICD-10-CM | POA: Diagnosis not present

## 2023-05-16 IMAGING — XA DG HIP (WITH OR WITHOUT PELVIS) 1V*R*
1 series · 1 of 1 positions shown · non-contrast
Comparison: None.

CLINICAL DATA: Right total hip arthroplasty.

EXAM:
DG HIP (WITH OR WITHOUT PELVIS) 1V RIGHT

[Series 1: dg no report - auto finalize · 0.20mm/px · 1 of 1 slices shown]
[im 1/1]
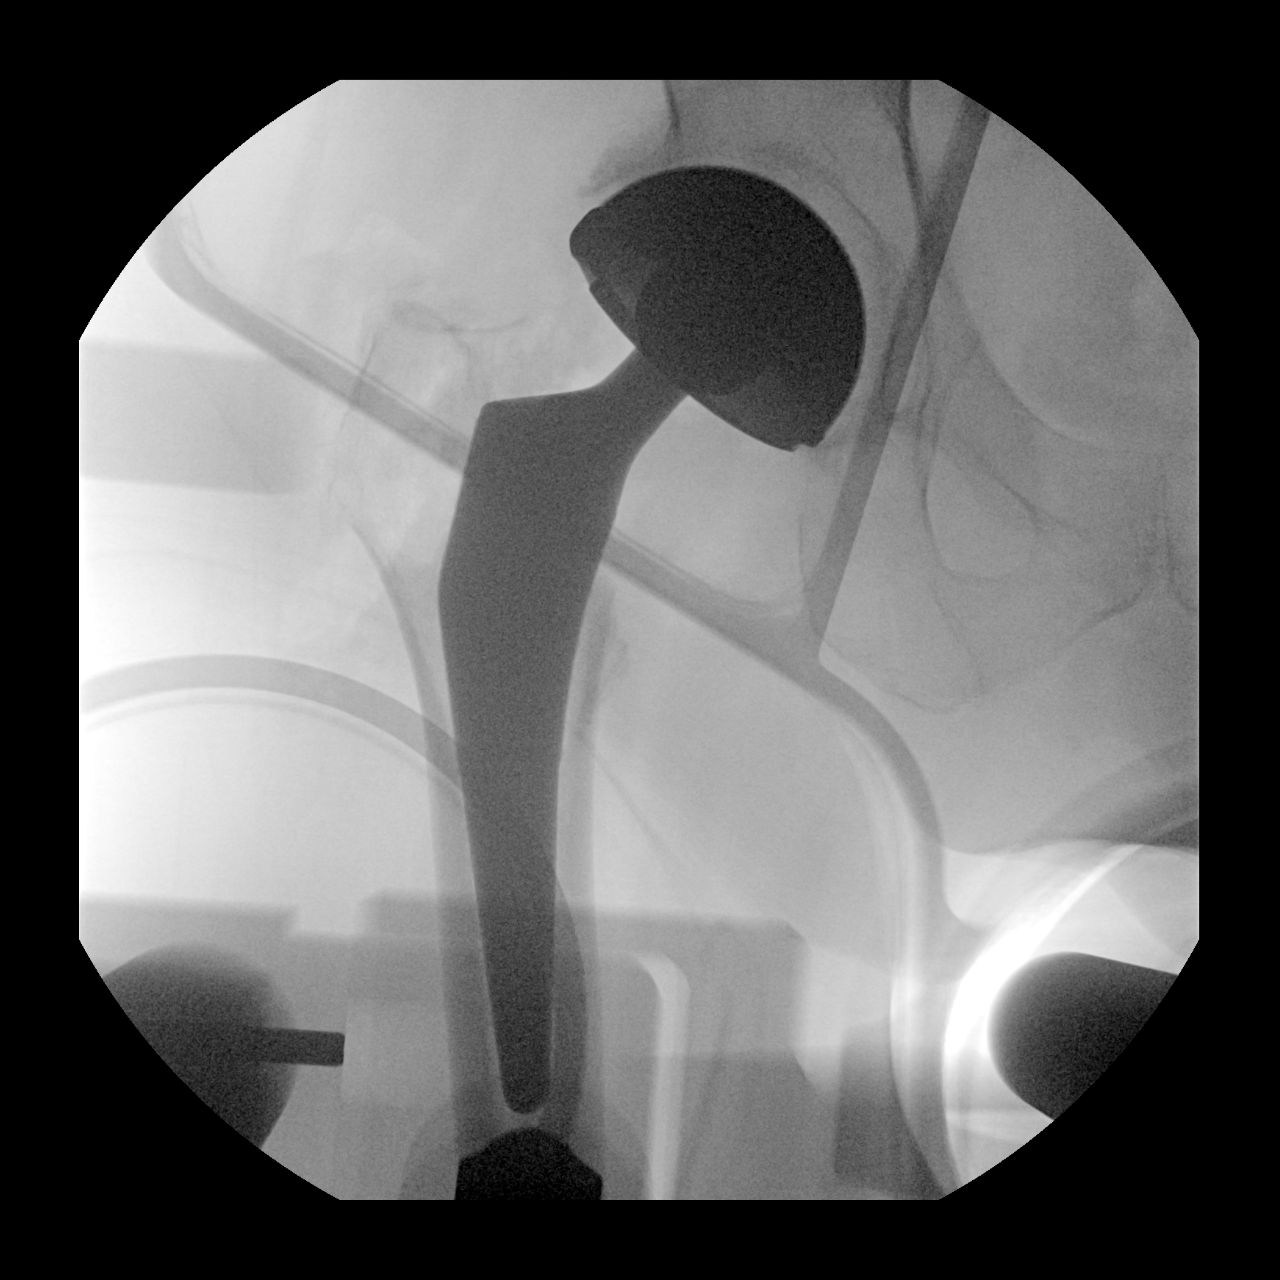

[1 of 1 positions shown; findings below may reference images not displayed]

FINDINGS: Single portable radiograph obtained in the operating room via C-arm
radiography shows changes of a right total hip arthroplasty. The
hardware components are in anatomic alignment. No immediate
complications identified.
IMPRESSION: Status post right total hip arthroplasty.  None

## 2023-05-16 IMAGING — DX DG HIP (WITH OR WITHOUT PELVIS) 2-3V*R*
2 series · 2 of 2 positions shown · non-contrast
Comparison: Intraoperative imaging from September 01, 2021.

CLINICAL DATA: Post total hip arthroplasty.

EXAM:
DG HIP (WITH OR WITHOUT PELVIS) 2-3V RIGHT

[hip ap]
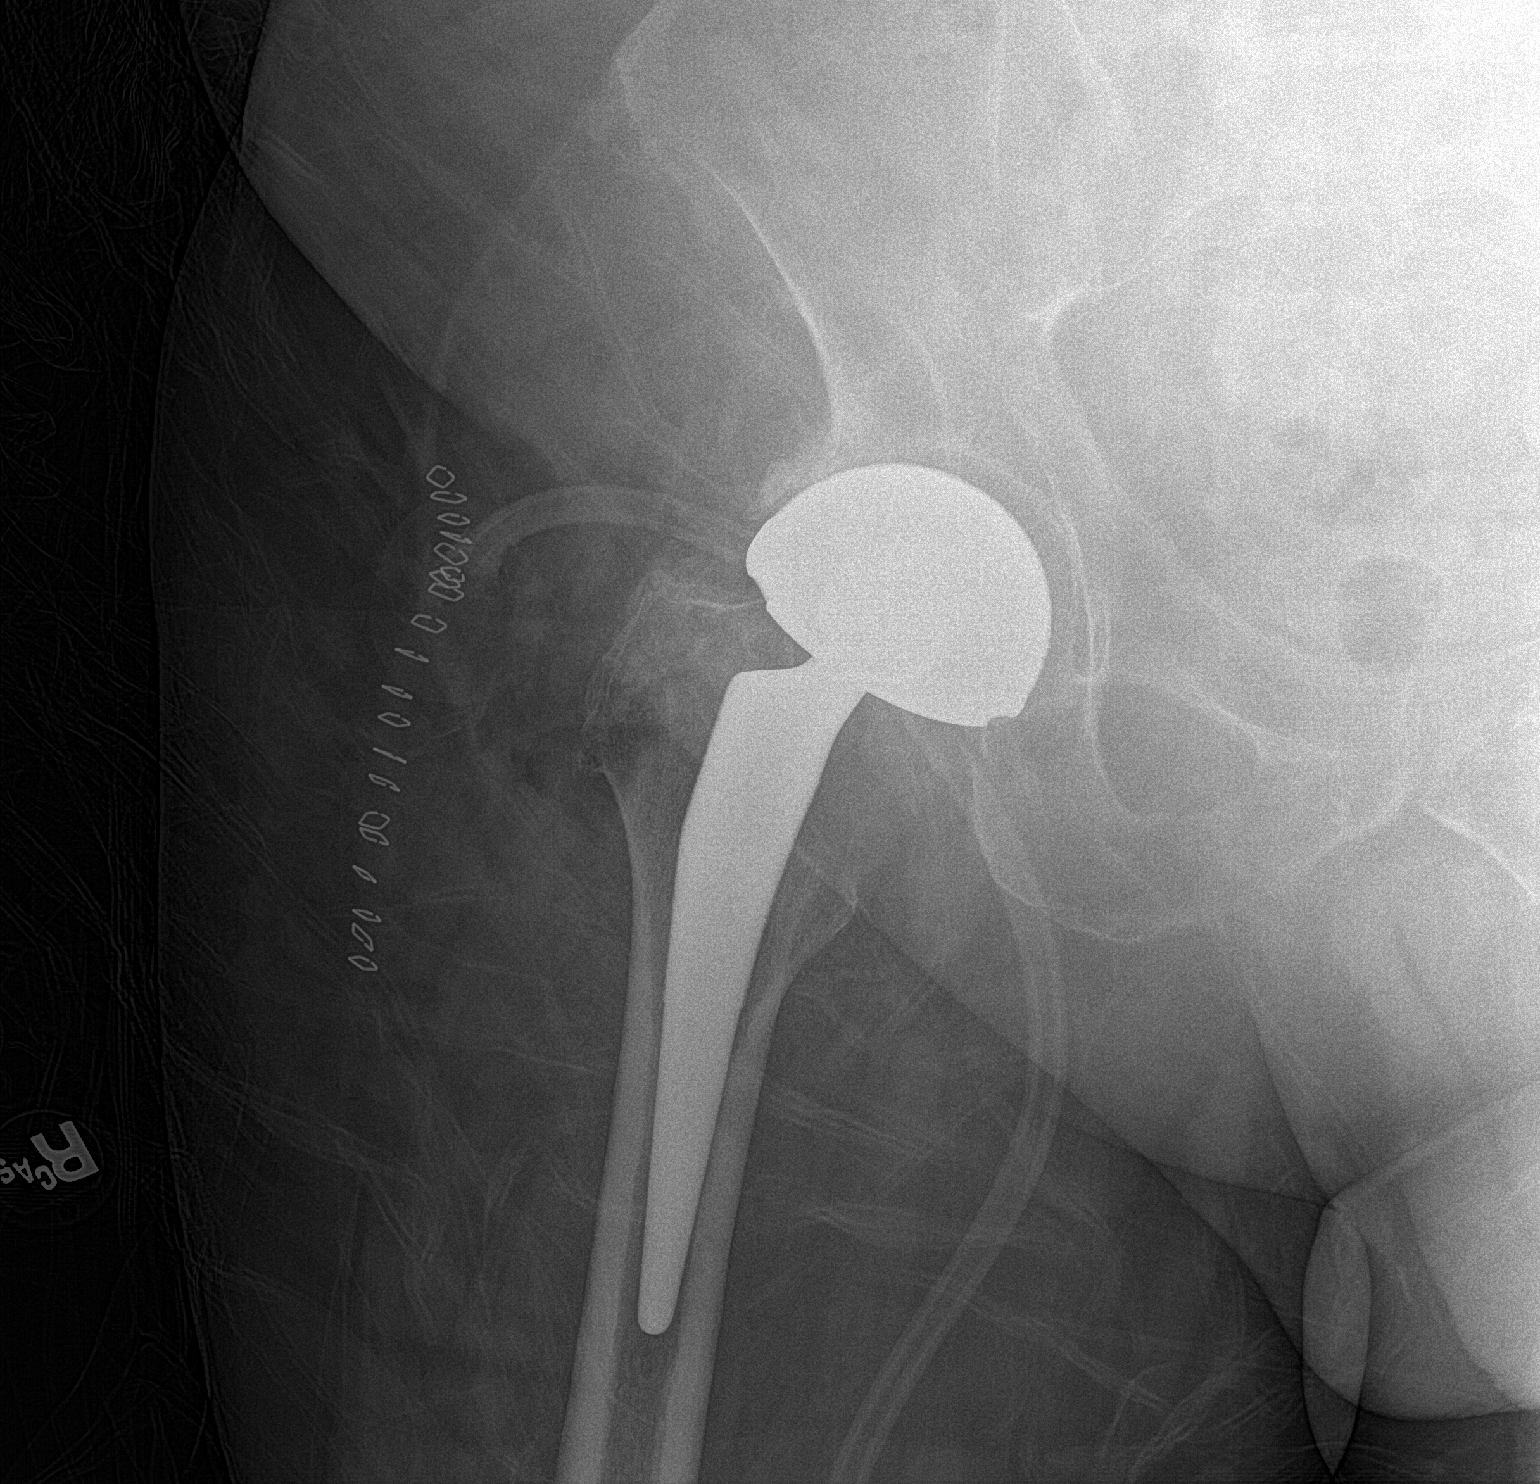

[hip lat]
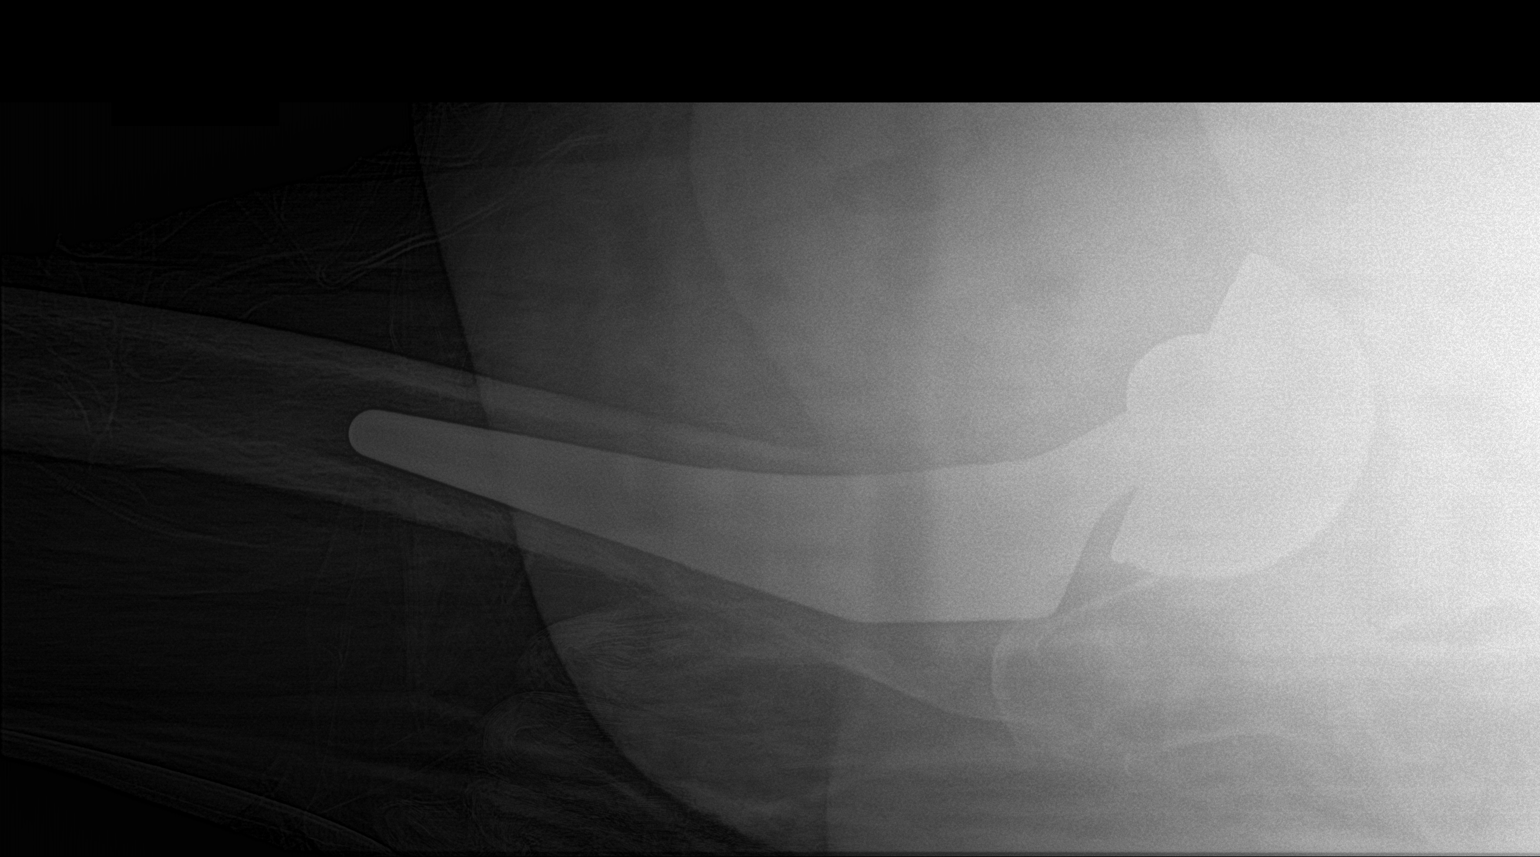

[2 of 2 positions shown; findings below may reference images not displayed]

FINDINGS: Post RIGHT total hip arthroplasty. Surgical drain in the soft
tissues. Skin staples overlying the site. Small amounts of gas in
the soft tissues as well. No unexpected radiographic findings.
IMPRESSION: Post RIGHT total hip arthroplasty. No unexpected radiographic
findings.

## 2023-09-19 DIAGNOSIS — E118 Type 2 diabetes mellitus with unspecified complications: Secondary | ICD-10-CM | POA: Diagnosis not present

## 2023-09-19 DIAGNOSIS — Z125 Encounter for screening for malignant neoplasm of prostate: Secondary | ICD-10-CM | POA: Diagnosis not present

## 2023-09-19 DIAGNOSIS — Z79899 Other long term (current) drug therapy: Secondary | ICD-10-CM | POA: Diagnosis not present

## 2023-09-19 DIAGNOSIS — E782 Mixed hyperlipidemia: Secondary | ICD-10-CM | POA: Diagnosis not present

## 2023-09-25 DIAGNOSIS — E66812 Obesity, class 2: Secondary | ICD-10-CM | POA: Diagnosis not present

## 2023-09-25 DIAGNOSIS — Z1211 Encounter for screening for malignant neoplasm of colon: Secondary | ICD-10-CM | POA: Diagnosis not present

## 2023-09-25 DIAGNOSIS — J431 Panlobular emphysema: Secondary | ICD-10-CM | POA: Diagnosis not present

## 2023-09-25 DIAGNOSIS — Z6839 Body mass index (BMI) 39.0-39.9, adult: Secondary | ICD-10-CM | POA: Diagnosis not present

## 2023-09-25 DIAGNOSIS — I420 Dilated cardiomyopathy: Secondary | ICD-10-CM | POA: Diagnosis not present

## 2023-09-25 DIAGNOSIS — E118 Type 2 diabetes mellitus with unspecified complications: Secondary | ICD-10-CM | POA: Diagnosis not present

## 2023-09-25 DIAGNOSIS — E538 Deficiency of other specified B group vitamins: Secondary | ICD-10-CM | POA: Diagnosis not present

## 2023-09-25 DIAGNOSIS — E782 Mixed hyperlipidemia: Secondary | ICD-10-CM | POA: Diagnosis not present

## 2023-09-25 DIAGNOSIS — I10 Essential (primary) hypertension: Secondary | ICD-10-CM | POA: Diagnosis not present

## 2024-01-02 DIAGNOSIS — I5032 Chronic diastolic (congestive) heart failure: Secondary | ICD-10-CM | POA: Diagnosis not present

## 2024-01-02 DIAGNOSIS — E66812 Obesity, class 2: Secondary | ICD-10-CM | POA: Diagnosis not present

## 2024-01-02 DIAGNOSIS — Z1211 Encounter for screening for malignant neoplasm of colon: Secondary | ICD-10-CM | POA: Diagnosis not present

## 2024-01-02 DIAGNOSIS — E118 Type 2 diabetes mellitus with unspecified complications: Secondary | ICD-10-CM | POA: Diagnosis not present

## 2024-01-02 DIAGNOSIS — Z0000 Encounter for general adult medical examination without abnormal findings: Secondary | ICD-10-CM | POA: Diagnosis not present

## 2024-01-02 DIAGNOSIS — J431 Panlobular emphysema: Secondary | ICD-10-CM | POA: Diagnosis not present

## 2024-01-02 DIAGNOSIS — E782 Mixed hyperlipidemia: Secondary | ICD-10-CM | POA: Diagnosis not present

## 2024-01-02 DIAGNOSIS — E538 Deficiency of other specified B group vitamins: Secondary | ICD-10-CM | POA: Diagnosis not present

## 2024-01-02 DIAGNOSIS — Z720 Tobacco use: Secondary | ICD-10-CM | POA: Diagnosis not present

## 2024-01-02 DIAGNOSIS — Z125 Encounter for screening for malignant neoplasm of prostate: Secondary | ICD-10-CM | POA: Diagnosis not present

## 2024-01-02 DIAGNOSIS — I10 Essential (primary) hypertension: Secondary | ICD-10-CM | POA: Diagnosis not present

## 2024-01-02 DIAGNOSIS — Z79899 Other long term (current) drug therapy: Secondary | ICD-10-CM | POA: Diagnosis not present

## 2024-02-07 DIAGNOSIS — Z1211 Encounter for screening for malignant neoplasm of colon: Secondary | ICD-10-CM | POA: Diagnosis not present

## 2024-02-27 DIAGNOSIS — H6993 Unspecified Eustachian tube disorder, bilateral: Secondary | ICD-10-CM | POA: Diagnosis not present

## 2024-02-27 DIAGNOSIS — M7918 Myalgia, other site: Secondary | ICD-10-CM | POA: Diagnosis not present

## 2024-03-11 DIAGNOSIS — J431 Panlobular emphysema: Secondary | ICD-10-CM | POA: Diagnosis not present

## 2024-03-11 DIAGNOSIS — E118 Type 2 diabetes mellitus with unspecified complications: Secondary | ICD-10-CM | POA: Diagnosis not present

## 2024-03-11 DIAGNOSIS — R600 Localized edema: Secondary | ICD-10-CM | POA: Diagnosis not present

## 2024-03-17 DIAGNOSIS — E118 Type 2 diabetes mellitus with unspecified complications: Secondary | ICD-10-CM | POA: Diagnosis not present

## 2024-03-17 DIAGNOSIS — I10 Essential (primary) hypertension: Secondary | ICD-10-CM | POA: Diagnosis not present

## 2024-03-17 DIAGNOSIS — J431 Panlobular emphysema: Secondary | ICD-10-CM | POA: Diagnosis not present

## 2024-03-17 DIAGNOSIS — R600 Localized edema: Secondary | ICD-10-CM | POA: Diagnosis not present

## 2024-03-25 ENCOUNTER — Inpatient Hospital Stay
Admission: EM | Admit: 2024-03-25 | Discharge: 2024-04-08 | DRG: 291 | Disposition: A | Discharge status: Skilled Nursing Facility | Attending: Emergency Medicine | Admitting: Emergency Medicine

## 2024-03-25 ENCOUNTER — Emergency Department

## 2024-03-25 ENCOUNTER — Telehealth: Payer: Self-pay | Admitting: Emergency Medicine

## 2024-03-25 ENCOUNTER — Inpatient Hospital Stay

## 2024-03-25 ENCOUNTER — Other Ambulatory Visit: Payer: Self-pay

## 2024-03-25 DIAGNOSIS — M479 Spondylosis, unspecified: Secondary | ICD-10-CM | POA: Diagnosis present

## 2024-03-25 DIAGNOSIS — E785 Hyperlipidemia, unspecified: Secondary | ICD-10-CM | POA: Diagnosis not present

## 2024-03-25 DIAGNOSIS — J439 Emphysema, unspecified: Secondary | ICD-10-CM | POA: Diagnosis not present

## 2024-03-25 DIAGNOSIS — J181 Lobar pneumonia, unspecified organism: Secondary | ICD-10-CM | POA: Diagnosis not present

## 2024-03-25 DIAGNOSIS — I420 Dilated cardiomyopathy: Secondary | ICD-10-CM | POA: Diagnosis not present

## 2024-03-25 DIAGNOSIS — E6609 Other obesity due to excess calories: Secondary | ICD-10-CM | POA: Diagnosis not present

## 2024-03-25 DIAGNOSIS — I110 Hypertensive heart disease with heart failure: Secondary | ICD-10-CM | POA: Diagnosis not present

## 2024-03-25 DIAGNOSIS — I10 Essential (primary) hypertension: Secondary | ICD-10-CM | POA: Diagnosis present

## 2024-03-25 DIAGNOSIS — E66811 Obesity, class 1: Secondary | ICD-10-CM | POA: Diagnosis present

## 2024-03-25 DIAGNOSIS — Z7982 Long term (current) use of aspirin: Secondary | ICD-10-CM

## 2024-03-25 DIAGNOSIS — J441 Chronic obstructive pulmonary disease with (acute) exacerbation: Principal | ICD-10-CM | POA: Diagnosis present

## 2024-03-25 DIAGNOSIS — N179 Acute kidney failure, unspecified: Secondary | ICD-10-CM | POA: Diagnosis present

## 2024-03-25 DIAGNOSIS — E869 Volume depletion, unspecified: Secondary | ICD-10-CM | POA: Diagnosis not present

## 2024-03-25 DIAGNOSIS — Z6839 Body mass index (BMI) 39.0-39.9, adult: Secondary | ICD-10-CM | POA: Diagnosis not present

## 2024-03-25 DIAGNOSIS — D751 Secondary polycythemia: Secondary | ICD-10-CM | POA: Diagnosis present

## 2024-03-25 DIAGNOSIS — F17210 Nicotine dependence, cigarettes, uncomplicated: Secondary | ICD-10-CM | POA: Diagnosis present

## 2024-03-25 DIAGNOSIS — J9621 Acute and chronic respiratory failure with hypoxia: Secondary | ICD-10-CM | POA: Diagnosis present

## 2024-03-25 DIAGNOSIS — J9601 Acute respiratory failure with hypoxia: Secondary | ICD-10-CM | POA: Diagnosis not present

## 2024-03-25 DIAGNOSIS — R339 Retention of urine, unspecified: Secondary | ICD-10-CM | POA: Diagnosis not present

## 2024-03-25 DIAGNOSIS — F418 Other specified anxiety disorders: Secondary | ICD-10-CM | POA: Diagnosis not present

## 2024-03-25 DIAGNOSIS — E669 Obesity, unspecified: Secondary | ICD-10-CM | POA: Diagnosis not present

## 2024-03-25 DIAGNOSIS — R0902 Hypoxemia: Secondary | ICD-10-CM | POA: Diagnosis not present

## 2024-03-25 DIAGNOSIS — D696 Thrombocytopenia, unspecified: Secondary | ICD-10-CM | POA: Diagnosis not present

## 2024-03-25 DIAGNOSIS — Z8249 Family history of ischemic heart disease and other diseases of the circulatory system: Secondary | ICD-10-CM

## 2024-03-25 DIAGNOSIS — N1831 Chronic kidney disease, stage 3a: Secondary | ICD-10-CM | POA: Diagnosis present

## 2024-03-25 DIAGNOSIS — R0602 Shortness of breath: Secondary | ICD-10-CM | POA: Diagnosis not present

## 2024-03-25 DIAGNOSIS — J440 Chronic obstructive pulmonary disease with acute lower respiratory infection: Secondary | ICD-10-CM | POA: Diagnosis present

## 2024-03-25 DIAGNOSIS — R490 Dysphonia: Secondary | ICD-10-CM | POA: Diagnosis not present

## 2024-03-25 DIAGNOSIS — I130 Hypertensive heart and chronic kidney disease with heart failure and stage 1 through stage 4 chronic kidney disease, or unspecified chronic kidney disease: Principal | ICD-10-CM | POA: Diagnosis present

## 2024-03-25 DIAGNOSIS — J9602 Acute respiratory failure with hypercapnia: Secondary | ICD-10-CM | POA: Diagnosis present

## 2024-03-25 DIAGNOSIS — Z825 Family history of asthma and other chronic lower respiratory diseases: Secondary | ICD-10-CM

## 2024-03-25 DIAGNOSIS — M1612 Unilateral primary osteoarthritis, left hip: Secondary | ICD-10-CM | POA: Diagnosis not present

## 2024-03-25 DIAGNOSIS — I5031 Acute diastolic (congestive) heart failure: Secondary | ICD-10-CM | POA: Diagnosis not present

## 2024-03-25 DIAGNOSIS — J9622 Acute and chronic respiratory failure with hypercapnia: Secondary | ICD-10-CM | POA: Diagnosis present

## 2024-03-25 DIAGNOSIS — D6959 Other secondary thrombocytopenia: Secondary | ICD-10-CM | POA: Diagnosis not present

## 2024-03-25 DIAGNOSIS — J432 Centrilobular emphysema: Secondary | ICD-10-CM | POA: Diagnosis not present

## 2024-03-25 DIAGNOSIS — I5033 Acute on chronic diastolic (congestive) heart failure: Secondary | ICD-10-CM | POA: Diagnosis present

## 2024-03-25 DIAGNOSIS — Z9989 Dependence on other enabling machines and devices: Secondary | ICD-10-CM | POA: Diagnosis not present

## 2024-03-25 DIAGNOSIS — J4540 Moderate persistent asthma, uncomplicated: Secondary | ICD-10-CM | POA: Diagnosis not present

## 2024-03-25 DIAGNOSIS — J9612 Chronic respiratory failure with hypercapnia: Secondary | ICD-10-CM | POA: Diagnosis present

## 2024-03-25 DIAGNOSIS — Z7951 Long term (current) use of inhaled steroids: Secondary | ICD-10-CM

## 2024-03-25 DIAGNOSIS — R0603 Acute respiratory distress: Secondary | ICD-10-CM | POA: Diagnosis present

## 2024-03-25 DIAGNOSIS — F32A Depression, unspecified: Secondary | ICD-10-CM | POA: Diagnosis not present

## 2024-03-25 DIAGNOSIS — I509 Heart failure, unspecified: Secondary | ICD-10-CM | POA: Diagnosis not present

## 2024-03-25 DIAGNOSIS — R41841 Cognitive communication deficit: Secondary | ICD-10-CM | POA: Diagnosis not present

## 2024-03-25 DIAGNOSIS — E871 Hypo-osmolality and hyponatremia: Secondary | ICD-10-CM | POA: Diagnosis not present

## 2024-03-25 DIAGNOSIS — R600 Localized edema: Secondary | ICD-10-CM | POA: Diagnosis not present

## 2024-03-25 DIAGNOSIS — G4733 Obstructive sleep apnea (adult) (pediatric): Secondary | ICD-10-CM | POA: Diagnosis not present

## 2024-03-25 DIAGNOSIS — M7989 Other specified soft tissue disorders: Secondary | ICD-10-CM | POA: Diagnosis not present

## 2024-03-25 DIAGNOSIS — Z96641 Presence of right artificial hip joint: Secondary | ICD-10-CM | POA: Diagnosis present

## 2024-03-25 DIAGNOSIS — M351 Other overlap syndromes: Secondary | ICD-10-CM | POA: Diagnosis not present

## 2024-03-25 DIAGNOSIS — E876 Hypokalemia: Secondary | ICD-10-CM | POA: Diagnosis not present

## 2024-03-25 DIAGNOSIS — Z66 Do not resuscitate: Secondary | ICD-10-CM | POA: Diagnosis not present

## 2024-03-25 DIAGNOSIS — R338 Other retention of urine: Secondary | ICD-10-CM | POA: Insufficient documentation

## 2024-03-25 DIAGNOSIS — Z833 Family history of diabetes mellitus: Secondary | ICD-10-CM

## 2024-03-25 DIAGNOSIS — R059 Cough, unspecified: Secondary | ICD-10-CM | POA: Diagnosis not present

## 2024-03-25 DIAGNOSIS — K5909 Other constipation: Secondary | ICD-10-CM | POA: Diagnosis not present

## 2024-03-25 DIAGNOSIS — M1389 Other specified arthritis, multiple sites: Secondary | ICD-10-CM | POA: Diagnosis not present

## 2024-03-25 DIAGNOSIS — R2689 Other abnormalities of gait and mobility: Secondary | ICD-10-CM | POA: Diagnosis not present

## 2024-03-25 DIAGNOSIS — R52 Pain, unspecified: Secondary | ICD-10-CM | POA: Diagnosis not present

## 2024-03-25 DIAGNOSIS — Z79899 Other long term (current) drug therapy: Secondary | ICD-10-CM

## 2024-03-25 DIAGNOSIS — N189 Chronic kidney disease, unspecified: Secondary | ICD-10-CM | POA: Diagnosis not present

## 2024-03-25 DIAGNOSIS — Z6836 Body mass index (BMI) 36.0-36.9, adult: Secondary | ICD-10-CM | POA: Diagnosis not present

## 2024-03-25 DIAGNOSIS — R918 Other nonspecific abnormal finding of lung field: Secondary | ICD-10-CM | POA: Diagnosis not present

## 2024-03-25 DIAGNOSIS — R12 Heartburn: Secondary | ICD-10-CM | POA: Diagnosis not present

## 2024-03-25 DIAGNOSIS — D7589 Other specified diseases of blood and blood-forming organs: Secondary | ICD-10-CM | POA: Diagnosis present

## 2024-03-25 DIAGNOSIS — R2681 Unsteadiness on feet: Secondary | ICD-10-CM | POA: Diagnosis not present

## 2024-03-25 DIAGNOSIS — B370 Candidal stomatitis: Secondary | ICD-10-CM | POA: Diagnosis not present

## 2024-03-25 DIAGNOSIS — E66812 Obesity, class 2: Secondary | ICD-10-CM | POA: Diagnosis not present

## 2024-03-25 DIAGNOSIS — Z7189 Other specified counseling: Secondary | ICD-10-CM | POA: Diagnosis not present

## 2024-03-25 DIAGNOSIS — Z9981 Dependence on supplemental oxygen: Secondary | ICD-10-CM | POA: Diagnosis not present

## 2024-03-25 DIAGNOSIS — M6281 Muscle weakness (generalized): Secondary | ICD-10-CM | POA: Diagnosis not present

## 2024-03-25 DIAGNOSIS — Z515 Encounter for palliative care: Secondary | ICD-10-CM | POA: Diagnosis not present

## 2024-03-25 DIAGNOSIS — I517 Cardiomegaly: Secondary | ICD-10-CM | POA: Diagnosis not present

## 2024-03-25 LAB — OSMOLALITY, URINE: Osmolality, Ur: 249 mosm/kg — ABNORMAL LOW (ref 300–900)

## 2024-03-25 LAB — BLOOD GAS, VENOUS
Acid-Base Excess: 33.2 mmol/L — ABNORMAL HIGH (ref 0.0–2.0)
Bicarbonate: 65 mmol/L — ABNORMAL HIGH (ref 20.0–28.0)
O2 Saturation: 35.9 %
Patient temperature: 37
pCO2, Ven: 98 mmHg (ref 44–60)
pH, Ven: 7.43 (ref 7.25–7.43)
pO2, Ven: <31 mmHg — CL (ref 32–45)

## 2024-03-25 LAB — URINALYSIS, ROUTINE W REFLEX MICROSCOPIC
Bacteria, UA: NONE SEEN
Bilirubin Urine: NEGATIVE
Glucose, UA: NEGATIVE mg/dL
Ketones, ur: NEGATIVE mg/dL
Leukocytes,Ua: NEGATIVE
Nitrite: NEGATIVE
Protein, ur: 30 mg/dL — AB
Specific Gravity, Urine: 1.005 (ref 1.005–1.030)
pH: 7 (ref 5.0–8.0)

## 2024-03-25 LAB — LIPID PANEL
Cholesterol: 206 mg/dL — ABNORMAL HIGH (ref 0–200)
HDL: 72 mg/dL (ref >40)
LDL Cholesterol: 109 mg/dL — ABNORMAL HIGH (ref 0–99)
Total CHOL/HDL Ratio: 2.9 ratio
Triglycerides: 123 mg/dL (ref <150)
VLDL: 25 mg/dL (ref 0–40)

## 2024-03-25 LAB — HIV ANTIBODY (ROUTINE TESTING W REFLEX): HIV Screen 4th Generation wRfx: NONREACTIVE

## 2024-03-25 LAB — BASIC METABOLIC PANEL WITH GFR
Anion gap: 21 — ABNORMAL HIGH (ref 5–15)
BUN: 36 mg/dL — ABNORMAL HIGH (ref 8–23)
CO2: 42 mmol/L — ABNORMAL HIGH (ref 22–32)
Calcium: 10.2 mg/dL (ref 8.9–10.3)
Chloride: 69 mmol/L — ABNORMAL LOW (ref 98–111)
Creatinine, Ser: 1.31 mg/dL — ABNORMAL HIGH (ref 0.61–1.24)
GFR, Estimated: >60 mL/min (ref >60)
Glucose, Bld: 137 mg/dL — ABNORMAL HIGH (ref 70–99)
Potassium: 4.1 mmol/L (ref 3.5–5.1)
Sodium: 132 mmol/L — ABNORMAL LOW (ref 135–145)

## 2024-03-25 LAB — CBC
HCT: 58.1 % — ABNORMAL HIGH (ref 39.0–52.0)
Hemoglobin: 18.7 g/dL — ABNORMAL HIGH (ref 13.0–17.0)
MCH: 34.6 pg — ABNORMAL HIGH (ref 26.0–34.0)
MCHC: 32.2 g/dL (ref 30.0–36.0)
MCV: 107.6 fL — ABNORMAL HIGH (ref 80.0–100.0)
Platelets: 143 K/uL — ABNORMAL LOW (ref 150–400)
RBC: 5.4 MIL/uL (ref 4.22–5.81)
RDW: 14.6 % (ref 11.5–15.5)
WBC: 5.8 K/uL (ref 4.0–10.5)
nRBC: 0.3 % — ABNORMAL HIGH (ref 0.0–0.2)

## 2024-03-25 LAB — MAGNESIUM: Magnesium: 2.5 mg/dL — ABNORMAL HIGH (ref 1.7–2.4)

## 2024-03-25 LAB — BRAIN NATRIURETIC PEPTIDE: B Natriuretic Peptide: 1280.7 pg/mL — ABNORMAL HIGH (ref 0.0–100.0)

## 2024-03-25 LAB — SODIUM, URINE, RANDOM: Sodium, Ur: 78 mmol/L

## 2024-03-25 LAB — CHLORIDE, URINE, RANDOM: Chloride Urine: 66 mmol/L

## 2024-03-25 LAB — TROPONIN I (HIGH SENSITIVITY)
Troponin I (High Sensitivity): 79 ng/L — ABNORMAL HIGH (ref <18)
Troponin I (High Sensitivity): 78 ng/L — ABNORMAL HIGH (ref <18)

## 2024-03-25 MED ORDER — BUDESONIDE 0.5 MG/2ML IN SUSP
0.5000 mg | Freq: Two times a day (BID) | RESPIRATORY_TRACT | Status: DC
  Administered 2024-03-25 – 2024-04-08 (×28): 0.5 mg via RESPIRATORY_TRACT
  Filled 2024-03-25 (×28): qty 2

## 2024-03-25 MED ORDER — ACETAMINOPHEN 325 MG PO TABS: 650.0000 mg | ORAL_TABLET | Freq: Four times a day (QID) | ORAL | Status: DC | PRN

## 2024-03-25 MED ORDER — METHYLPREDNISOLONE SODIUM SUCC 125 MG IJ SOLR
125.0000 mg | Freq: Once | INTRAMUSCULAR | Status: AC
  Administered 2024-03-25: 125 mg via INTRAVENOUS
  Filled 2024-03-25: qty 2

## 2024-03-25 MED ORDER — FUROSEMIDE 10 MG/ML IJ SOLN
60.0000 mg | Freq: Once | INTRAMUSCULAR | Status: AC
Start: 2024-03-25 — End: 2024-03-25
  Administered 2024-03-25: 60 mg via INTRAVENOUS
  Filled 2024-03-25: qty 8

## 2024-03-25 MED ORDER — SENNOSIDES-DOCUSATE SODIUM 8.6-50 MG PO TABS: 1.0000 | ORAL_TABLET | Freq: Every evening | ORAL | Status: DC | PRN

## 2024-03-25 MED ORDER — ENOXAPARIN SODIUM 40 MG/0.4ML IJ SOSY: 40.0000 mg | PREFILLED_SYRINGE | INTRAMUSCULAR | Status: DC

## 2024-03-25 MED ORDER — IPRATROPIUM-ALBUTEROL 0.5-2.5 (3) MG/3ML IN SOLN
6.0000 mL | Freq: Once | RESPIRATORY_TRACT | Status: AC
Start: 2024-03-25 — End: 2024-03-25
  Administered 2024-03-25: 6 mL via RESPIRATORY_TRACT
  Filled 2024-03-25: qty 6

## 2024-03-25 MED ORDER — ENOXAPARIN SODIUM 60 MG/0.6ML IJ SOSY
55.0000 mg | PREFILLED_SYRINGE | INTRAMUSCULAR | Status: DC
  Administered 2024-03-25 – 2024-03-31 (×7): 55 mg via SUBCUTANEOUS
  Filled 2024-03-25 (×7): qty 0.6

## 2024-03-25 MED ORDER — ACETAMINOPHEN 650 MG RE SUPP: 650.0000 mg | Freq: Four times a day (QID) | RECTAL | Status: DC | PRN

## 2024-03-25 MED ORDER — IPRATROPIUM-ALBUTEROL 0.5-2.5 (3) MG/3ML IN SOLN
3.0000 mL | Freq: Four times a day (QID) | RESPIRATORY_TRACT | Status: DC
  Administered 2024-03-26 – 2024-03-27 (×6): 3 mL via RESPIRATORY_TRACT
  Filled 2024-03-25 (×7): qty 3

## 2024-03-25 MED ORDER — NICOTINE 14 MG/24HR TD PT24
14.0000 mg | MEDICATED_PATCH | Freq: Every day | TRANSDERMAL | Status: DC
  Administered 2024-03-25 – 2024-04-08 (×12): 14 mg via TRANSDERMAL
  Filled 2024-03-25 (×15): qty 1

## 2024-03-25 NOTE — Telephone Encounter (Signed)
 Dr. Auston sending from clinic. Acute resp failure, sat 84% on RA. Concern for heart failure as well. In last 2 weeks taking abx, steroids, lasix  without improvement and has hypoxia with weeping lower extremity edema. Needs admission. Dr. Auston [phone])

## 2024-03-25 NOTE — Progress Notes (Signed)
 PHARMACIST - PHYSICIAN COMMUNICATION  CONCERNING:  Enoxaparin  (Lovenox ) for DVT Prophylaxis    RECOMMENDATION: Patient was prescribed enoxaprin 40mg  q24 hours for VTE prophylaxis.   Filed Weights   03/25/24 1151  Weight: 113.4 kg (250 lb)    Body mass index is 36.92 kg/m.  Estimated Creatinine Clearance: 68.9 mL/min (A) (by C-G formula based on SCr of 1.31 mg/dL (H)).   Based on [facility] policy patient is candidate for enoxaparin  0.5mg /kg TBW SQ every 24 hours based on BMI being >30.   DESCRIPTION: Pharmacy has adjusted enoxaparin  dose per [facility] policy.  Patient is now receiving enoxaparin  55 mg every 24 hours   Miguel Howell CHRISTELLA Lutes, PharmD, BCPS Clinical Pharmacist 03/25/2024 7:38 PM

## 2024-03-25 NOTE — ED Triage Notes (Signed)
 First nurse note: pt arrived from [facility] accompanied by RN with low O2 saturation. Pt was being seen for follow up visit with no complaints of chest pain or SOB. Upon arrival to his appt, pt O2 was 81% on room air. Improved to 97% on 2L via nasal canula. Initial BP 118/70 and HR 81 per RN.

## 2024-03-25 NOTE — ED Triage Notes (Signed)
 Pt to ED from [facility] for low O2 saturation. Pt was being seen for follow up for respiratory infection. Pt O2 81% on room air, improved to 97% on 2L.

## 2024-03-25 NOTE — ED Provider Notes (Signed)
 Miguel Howell Provider Note    Event Date/Time   First MD Initiated Contact with Patient 03/25/24 1548     (approximate)   History   Respiratory Distress   HPI  Miguel Howell is a 68 y.o. male who presents to the ED for evaluation of Respiratory Distress   Reviewed PCP visit from earlier today.  Obese patient with history of COPD and continued smoking, HTN and known dilated cardiomyopathy.  Hypoxic in the clinic today and sent to us .  Patient presents with progressive shortness of breath, cough and orthopnea over the past few weeks.   Physical Exam   Triage Vital Signs: ED Triage Vitals  Encounter Vitals Group     BP 03/25/24 1145 (!) 165/87     Girls Systolic BP Percentile --      Girls Diastolic BP Percentile --      Boys Systolic BP Percentile --      Boys Diastolic BP Percentile --      Pulse Rate 03/25/24 1145 78     Resp 03/25/24 1145 16     Temp 03/25/24 1145 97.7 F (36.5 C)     Temp Source 03/25/24 1145 Oral     SpO2 03/25/24 1145 100 %     Weight 03/25/24 1151 250 lb (113.4 kg)     Height 03/25/24 1151 5' 9 (1.753 m)     Head Circumference --      Peak Flow --      Pain Score 03/25/24 1146 0     Pain Loc --      Pain Education --      Exclude from Growth Chart --     Most recent vital signs: Vitals:   03/25/24 1145 03/25/24 1656  BP: (!) 165/87 (!) 167/89  Pulse: 78 74  Resp: 16 18  Temp: 97.7 F (36.5 C) 97.9 F (36.6 C)  SpO2: 100% 99%    General: Awake, no distress.  CV:  Good peripheral perfusion.  Resp:  Decreased airflow, wheezing throughout Abd:  No distention.  MSK:  No deformity noted.  Pitting edema to bilateral lower extremities Neuro:  No focal deficits appreciated. Other:     ED Results / Procedures / Treatments   Labs (all labs ordered are listed, but only abnormal results are displayed) Labs Reviewed  BASIC METABOLIC PANEL WITH GFR - Abnormal; Notable for the following components:      Result  Value   Sodium 132 (*)    Chloride 69 (*)    CO2 42 (*)    Glucose, Bld 137 (*)    BUN 36 (*)    Creatinine, Ser 1.31 (*)    Anion gap 21 (*)    All other components within normal limits  CBC - Abnormal; Notable for the following components:   Hemoglobin 18.7 (*)    HCT 58.1 (*)    MCV 107.6 (*)    MCH 34.6 (*)    Platelets 143 (*)    nRBC 0.3 (*)    All other components within normal limits  BRAIN NATRIURETIC PEPTIDE - Abnormal; Notable for the following components:   B Natriuretic Peptide 1,280.7 (*)    All other components within normal limits  TROPONIN I (HIGH SENSITIVITY)    EKG Sinus rhythm with a rate of 75 bpm.  Normal axis, right bundle, no STEMI  RADIOLOGY CXR interpreted by me without evidence of acute cardiopulmonary pathology.  Official radiology report(s): DG Chest 2 View Result  Date: 03/25/2024 CLINICAL DATA:  Shortness of breath and cough 2-3 days. EXAM: CHEST - 2 VIEW COMPARISON:  10/16/2021 FINDINGS: Lungs are adequately inflated without focal airspace consolidation or effusion. Cardiomediastinal silhouette and remainder of the exam is unchanged. IMPRESSION: No active cardiopulmonary disease. Electronically Signed   By: Toribio Agreste M.D.   On: 03/25/2024 14:41    PROCEDURES and INTERVENTIONS:  .Critical Care  Performed by: Claudene Rover, MD Authorized by: Claudene Rover, MD   Critical care provider statement:    Critical care time (minutes):  30   Critical care time was exclusive of:  Separately billable procedures and treating other patients   Critical care was necessary to treat or prevent imminent or life-threatening deterioration of the following conditions:  Respiratory failure   Critical care was time spent personally by me on the following activities:  Development of treatment plan with patient or surrogate, discussions with consultants, evaluation of patient's response to treatment, examination of patient, ordering and review of laboratory studies,  ordering and review of radiographic studies, ordering and performing treatments and interventions, pulse oximetry, re-evaluation of patient's condition and review of old charts .1-3 Lead EKG Interpretation  Performed by: Claudene Rover, MD Authorized by: Claudene Rover, MD     Interpretation: normal     ECG rate:  76   ECG rate assessment: normal     Rhythm: sinus rhythm     Ectopy: none     Conduction: normal     Medications  budesonide  (PULMICORT ) nebulizer solution 0.5 mg (has no administration in time range)  ipratropium-albuterol  (DUONEB) 0.5-2.5 (3) MG/3ML nebulizer solution 6 mL (6 mLs Nebulization Given 03/25/24 1729)  furosemide  (LASIX ) injection 60 mg (60 mg Intravenous Given 03/25/24 1728)  methylPREDNISolone  sodium succinate (SOLU-MEDROL ) 125 mg/2 mL injection 125 mg (125 mg Intravenous Given 03/25/24 1726)     IMPRESSION / MDM / ASSESSMENT AND PLAN / ED COURSE  I reviewed the triage vital signs and the nursing notes.  Differential diagnosis includes, but is not limited to, ACS, PTX, PNA, muscle strain/spasm, PE, dissection, anxiety, pleural effusion  {Patient presents with symptoms of an acute illness or injury that is potentially life-threatening.  Patient presents with evidence of a CHF and COPD exacerbation with acute hypoxia requiring medical admission.  Appears volume overloaded and wheezing with signs of both CHF and COPD exacerbation.  Elevated BNP, no leukocytosis or infiltrate on x-ray.  Mild renal dysfunction possibly a an element of cardiorenal syndrome.  Initiate diuresis, breathing treatments and steroids.  Consult with medicine for admission      FINAL CLINICAL IMPRESSION(S) / ED DIAGNOSES   Final diagnoses:  COPD exacerbation (HCC)  Acute on chronic congestive heart failure, unspecified heart failure type (HCC)  Hypoxia  Shortness of breath     Rx / DC Orders   ED Discharge Orders     None        Note:  This document was prepared using  Dragon voice recognition software and may include unintentional dictation errors.   Claudene Rover, MD 03/25/24 [phone]

## 2024-03-25 NOTE — ED Notes (Signed)
 Pt called this RN to bedside. Pt states he can not tolerate the BiPap and wanted to take it off. This RN notified RT and provider.

## 2024-03-25 NOTE — ED Notes (Signed)
Called for treatment room, no answer  

## 2024-03-25 NOTE — ED Notes (Signed)
 Fernand MD made aware vbg has resulted

## 2024-03-25 NOTE — H&P (Signed)
 History and Physical    Miguel Howell FMW:[id number] DOB: Jun 19, 1955 DOA: 03/25/2024  DOS: the patient was seen and examined on 03/25/2024  PCP: Auston Reyes BIRCH, MD   Patient coming from: Home  I have personally briefly reviewed patient's old medical records in [facility] and CareEverywhere  HPI:   Miguel Howell is a 68 y.o. year old male with past medical history of HTN, HLD, HFpEF, COPD presenting to the ED with worsening shortness of breath.    Pt reports he has been feeling unwell for 3-4 weeks. He has been getting more short of breath but this improves and then gets worse. Over the last few days it has been getting worse.  Patient also has worsening sputum production.   ED Course: On arrival to the ED patient was noted to be HDS stable. CBC without leukocytosis, but macrocytosis and erythrocytosis. BMP with hyponatremia, AKI and elevated bicarb. CXR without any acute findings.    TRH contacted for admission.  Review of Systems: As mentioned in the history of present illness. All other systems reviewed and are negative.   Past Medical History:  Diagnosis Date   Arthritis    bilateral hips and back   Asthma    CHF (congestive heart failure) (HCC)    COPD (chronic obstructive pulmonary disease) (HCC)    Dyspnea    Hypertension     Past Surgical History:  Procedure Laterality Date   HERNIA REPAIR     TOTAL HIP ARTHROPLASTY Right 09/01/2021   Procedure: TOTAL HIP ARTHROPLASTY ANTERIOR APPROACH;  Surgeon: Kathlynn Sharper, MD;  Location: [facility] ORS;  Service: Orthopedics;  Laterality: Right;     No Known Allergies  Family History  Problem Relation Age of Onset   Diabetes Mother    Diabetes Father    COPD Father    Heart attack Father     Prior to Admission medications   Medication Sig Start Date End Date Taking? Authorizing Provider  acetaminophen  (TYLENOL ) 500 MG tablet Take 500 mg by mouth every 6 (six) hours as needed for fever, headache, moderate  pain or mild pain.    [provider]  ADVAIR HFA 230-21 MCG/ACT inhaler Inhale 2 puffs into the lungs 2 (two) times daily. 07/30/21   [provider]  albuterol  (PROVENTIL  HFA;VENTOLIN  HFA) 108 (90 Base) MCG/ACT inhaler Inhale 2 puffs into the lungs every 4 (four) hours as needed for wheezing or shortness of breath.    [provider]  ARGININE PO Take 1 capsule by mouth 2 (two) times daily. With citrulline    [provider]  aspirin  EC 81 MG tablet Take 81 mg by mouth daily.    [provider]  calcium  carbonate (TUMS - DOSED IN MG ELEMENTAL CALCIUM ) 500 MG chewable tablet Chew 1 tablet by mouth daily as needed for indigestion or heartburn.    [provider]  carvedilol  (COREG ) 6.25 MG tablet Take 3.125 mg by mouth 2 (two) times daily.     [provider]  diazepam  (VALIUM ) 10 MG tablet Take 10 mg by mouth at bedtime as needed for anxiety or sleep.     [provider]  docusate sodium  (COLACE) 100 MG capsule Take 1 capsule (100 mg total) by mouth 2 (two) times daily. 09/02/21   Charlene Debby BROCKS, PA-C  furosemide  (LASIX ) 40 MG tablet Take 1 tablet (40 mg total) by mouth daily. 06/12/16   Vaickute, Rima, MD  montelukast  (SINGULAIR ) 10 MG tablet Take 10 mg  by mouth daily.    [provider]  nabumetone  (RELAFEN ) 500 MG tablet Take 500 mg by mouth 2 (two) times daily. 07/28/21   [provider]  potassium chloride  (K-DUR,KLOR-CON ) 10 MEQ tablet Take 1 tablet (10 mEq total) by mouth 2 (two) times daily. 06/12/16   Vaickute, Rima, MD  PSYLLIUM HUSK PO Take 2 capsules by mouth 2 (two) times daily.    [provider]  tadalafil (CIALIS) 20 MG tablet Take 20 mg by mouth See admin instructions. Take 20 mg every 3 days as needed for ED 07/28/21   [provider]  Tiotropium Bromide  Monohydrate (SPIRIVA  RESPIMAT) 2.5 MCG/ACT AERS Inhale 1 puff into the lungs 2 (two) times daily.    [provider]       reports that he has been smoking cigarettes. He started smoking about 48 years ago. He has a 60 pack-year smoking history. He has quit using smokeless tobacco. He reports that he does not currently use alcohol. He reports that he does not use drugs. Lives by self.  Tobacco- Previously 2 ppd but now 2 packs per week.  EtOH- occasional alcohol use.   Illicit drug use- denies use.  IADLs/ADLs- can person independently at baseline    Physical Exam: Vitals:   03/25/24 1151 03/25/24 1656 03/25/24 1800 03/25/24 2143  BP:  (!) 167/89 (!) 158/88   Pulse:  74 78   Resp:  18 20   Temp:  97.9 F (36.6 C)  97.8 F (36.6 C)  TempSrc:  Oral  Oral  SpO2:  99% 99%   Weight: 113.4 kg     Height: 5' 9 (1.753 m)      Gen: NAD HENT: [place] in place CV: RRR, good radial pulse, 2 + LE edema Resp: CTAB, decrased breath sounds at bases Abd: No TTP, bowel sounds present MSK: Left > right lower extremity edema Skin: slight redness of left lower extremity Neuro: alert and oriented x4 Psych: normal mood   Labs on Admission: I have personally reviewed following labs and imaging studies  CBC: Recent Labs  Lab 03/25/24 1149  WBC 5.8  HGB 18.7*  HCT 58.1*  MCV 107.6*  PLT 143*   Basic Metabolic Panel: Recent Labs  Lab 03/25/24 1149 03/25/24 1905  NA 132*  --   K 4.1  --   CL 69*  --   CO2 42*  --   GLUCOSE 137*  --   BUN 36*  --   CREATININE 1.31*  --   CALCIUM  10.2  --   MG  --  2.5*   GFR: Estimated Creatinine Clearance: 68.9 mL/min (A) (by C-G formula based on SCr of 1.31 mg/dL (H)). Liver Function Tests: No results for input(s): AST, ALT, ALKPHOS, BILITOT, PROT, ALBUMIN in the last 168 hours. No results for input(s): LIPASE, AMYLASE in the last 168 hours. No results for input(s): AMMONIA in the last 168 hours. Coagulation Profile: No results for input(s): INR, PROTIME in the last 168 hours. Cardiac Enzymes: Recent Labs  Lab 03/25/24 1721  03/25/24 1905  TROPONINIHS 79* 78*   BNP (last 3 results) Recent Labs    03/25/24 1149  BNP 1,280.7*   HbA1C: No results for input(s): HGBA1C in the last 72 hours. CBG: No results for input(s): GLUCAP in the last 168 hours. Lipid Profile: No results for input(s): CHOL, HDL, LDLCALC, TRIG, CHOLHDL, LDLDIRECT in the last 72 hours. Thyroid  Function Tests: No results for input(s): TSH, T4TOTAL, FREET4, T3FREE, THYROIDAB in  the last 72 hours. Anemia Panel: No results for input(s): VITAMINB12, FOLATE, FERRITIN, TIBC, IRON, RETICCTPCT in the last 72 hours. Urine analysis:    Component Value Date/Time   COLORURINE STRAW (A) 03/25/2024 1940   APPEARANCEUR CLEAR (A) 03/25/2024 1940   LABSPEC 1.005 03/25/2024 1940   PHURINE 7.0 03/25/2024 1940   GLUCOSEU NEGATIVE 03/25/2024 1940   HGBUR SMALL (A) 03/25/2024 1940   BILIRUBINUR NEGATIVE 03/25/2024 1940   KETONESUR NEGATIVE 03/25/2024 1940   PROTEINUR 30 (A) 03/25/2024 1940   NITRITE NEGATIVE 03/25/2024 1940   LEUKOCYTESUR NEGATIVE 03/25/2024 1940    Radiological Exams on Admission: I have personally reviewed images DG Chest 2 View Result Date: 03/25/2024 CLINICAL DATA:  Shortness of breath and cough 2-3 days. EXAM: CHEST - 2 VIEW COMPARISON:  10/16/2021 FINDINGS: Lungs are adequately inflated without focal airspace consolidation or effusion. Cardiomediastinal silhouette and remainder of the exam is unchanged. IMPRESSION: No active cardiopulmonary disease. Electronically Signed   By: Toribio Agreste M.D.   On: 03/25/2024 14:41    EKG: My personal interpretation of EKG shows: NSR without any acute ST changes but with biatrial enlargement.     Assessment/Plan Principal Problem:   Acute hypoxic respiratory failure (HCC) Active Problems:   COPD with acute exacerbation (HCC)   HTN (hypertension)   Obesity (BMI 30-39.9)   Chronic hypercapnic respiratory failure (HCC) Pt with AHRF secondary to COPD  and CHF exacerbation. Pt is status post nebulizers, solumedrol, and lasix . Will continue treatment for both. Continue IV lasix  BID, monitor daily weights, and monitor strict I/O. Wean oxygen as able.   Hypercarbia: VBG obtained that showed hypercarbia. Started bipap as it will treat CHF exacerbation as well. pH is normal making me think this is chronic in nature likely secondary to OHS. May need to involve pulmonology tomorrow. Pt does not wear CPAP nightly.   Chronic problems: Hypertension: Continue home medications Hyperlipidemia: Continue home medications   VTE prophylaxis:  Lovenox   Diet: Regular Code Status:  Full Code Telemetry:  Admission status: Inpatient, Telemetry Patient is from: Home  Anticipated d/c is to: Home  Anticipated d/c is in: 2 days   Family Communication: Updated at bedside   Consults called: None    Severity of Illness: The appropriate patient status for this patient is INPATIENT. Inpatient status is judged to be reasonable and necessary in order to provide the required intensity of service to ensure the patient's safety. The patient's presenting symptoms, physical exam findings, and initial radiographic and laboratory data in the context of their chronic comorbidities is felt to place them at high risk for further clinical deterioration. Furthermore, it is not anticipated that the patient will be medically stable for discharge from the hospital within 2 midnights of admission.   * I certify that at the point of admission it is my clinical judgment that the patient will require inpatient hospital care spanning beyond 2 midnights from the point of admission due to high intensity of service, high risk for further deterioration and high frequency of surveillance required.DEWAINE Morene Bathe, MD [facility]

## 2024-03-26 ENCOUNTER — Inpatient Hospital Stay
Admit: 2024-03-26 | Discharge: 2024-03-26 | Disposition: A | Discharge status: Home / Self Care | Attending: Internal Medicine | Admitting: Internal Medicine

## 2024-03-26 DIAGNOSIS — E876 Hypokalemia: Secondary | ICD-10-CM | POA: Diagnosis not present

## 2024-03-26 DIAGNOSIS — I5033 Acute on chronic diastolic (congestive) heart failure: Secondary | ICD-10-CM | POA: Diagnosis not present

## 2024-03-26 DIAGNOSIS — E669 Obesity, unspecified: Secondary | ICD-10-CM

## 2024-03-26 DIAGNOSIS — J441 Chronic obstructive pulmonary disease with (acute) exacerbation: Secondary | ICD-10-CM | POA: Diagnosis not present

## 2024-03-26 DIAGNOSIS — J9601 Acute respiratory failure with hypoxia: Secondary | ICD-10-CM | POA: Diagnosis not present

## 2024-03-26 DIAGNOSIS — J9602 Acute respiratory failure with hypercapnia: Secondary | ICD-10-CM

## 2024-03-26 DIAGNOSIS — I10 Essential (primary) hypertension: Secondary | ICD-10-CM

## 2024-03-26 DIAGNOSIS — N1831 Chronic kidney disease, stage 3a: Secondary | ICD-10-CM | POA: Insufficient documentation

## 2024-03-26 LAB — BLOOD GAS, VENOUS
Acid-Base Excess: 34.8 mmol/L — ABNORMAL HIGH (ref 0.0–2.0)
Bicarbonate: 66.1 mmol/L — ABNORMAL HIGH (ref 20.0–28.0)
O2 Saturation: 73 %
Patient temperature: 37
pCO2, Ven: 93 mmHg (ref 44–60)
pH, Ven: 7.46 — ABNORMAL HIGH (ref 7.25–7.43)
pO2, Ven: 46 mmHg — ABNORMAL HIGH (ref 32–45)

## 2024-03-26 LAB — CBC
HCT: 55.5 % — ABNORMAL HIGH (ref 39.0–52.0)
Hemoglobin: 18 g/dL — ABNORMAL HIGH (ref 13.0–17.0)
MCH: 34.9 pg — ABNORMAL HIGH (ref 26.0–34.0)
MCHC: 32.4 g/dL (ref 30.0–36.0)
MCV: 107.6 fL — ABNORMAL HIGH (ref 80.0–100.0)
Platelets: 126 K/uL — ABNORMAL LOW (ref 150–400)
RBC: 5.16 MIL/uL (ref 4.22–5.81)
RDW: 14.3 % (ref 11.5–15.5)
WBC: 4.1 K/uL (ref 4.0–10.5)
nRBC: 0 % (ref 0.0–0.2)

## 2024-03-26 LAB — BLOOD GAS, ARTERIAL
Acid-Base Excess: 33.6 mmol/L — ABNORMAL HIGH (ref 0.0–2.0)
Bicarbonate: 64.7 mmol/L — ABNORMAL HIGH (ref 20.0–28.0)
O2 Content: 2 L/min
O2 Saturation: 97.9 %
Patient temperature: 37
pCO2 arterial: 91 mmHg (ref 32–48)
pH, Arterial: 7.46 — ABNORMAL HIGH (ref 7.35–7.45)
pO2, Arterial: 76 mmHg — ABNORMAL LOW (ref 83–108)

## 2024-03-26 LAB — BASIC METABOLIC PANEL WITH GFR
Anion gap: 20 — ABNORMAL HIGH (ref 5–15)
BUN: 34 mg/dL — ABNORMAL HIGH (ref 8–23)
CO2: 45 mmol/L — ABNORMAL HIGH (ref 22–32)
Calcium: 9.5 mg/dL (ref 8.9–10.3)
Chloride: 68 mmol/L — ABNORMAL LOW (ref 98–111)
Creatinine, Ser: 1.37 mg/dL — ABNORMAL HIGH (ref 0.61–1.24)
GFR, Estimated: 57 mL/min — ABNORMAL LOW (ref >60)
Glucose, Bld: 151 mg/dL — ABNORMAL HIGH (ref 70–99)
Potassium: 2.4 mmol/L — CL (ref 3.5–5.1)
Sodium: 133 mmol/L — ABNORMAL LOW (ref 135–145)

## 2024-03-26 LAB — MAGNESIUM: Magnesium: 2.1 mg/dL (ref 1.7–2.4)

## 2024-03-26 LAB — ECHOCARDIOGRAM COMPLETE
Height: 69 in
S' Lateral: 2.9 cm
Weight: 4000 [oz_av]

## 2024-03-26 MED ORDER — ASPIRIN 81 MG PO TBEC
81.0000 mg | DELAYED_RELEASE_TABLET | Freq: Every day | ORAL | Status: DC
  Administered 2024-03-26 – 2024-04-08 (×14): 81 mg via ORAL
  Filled 2024-03-26 (×14): qty 1

## 2024-03-26 MED ORDER — DOCUSATE SODIUM 100 MG PO CAPS
100.0000 mg | ORAL_CAPSULE | Freq: Two times a day (BID) | ORAL | Status: DC
  Administered 2024-03-26 – 2024-04-08 (×16): 100 mg via ORAL
  Filled 2024-03-26 (×25): qty 1

## 2024-03-26 MED ORDER — POTASSIUM CHLORIDE 10 MEQ/100ML IV SOLN
10.0000 meq | INTRAVENOUS | Status: AC
  Administered 2024-03-26 (×4): 10 meq via INTRAVENOUS
  Filled 2024-03-26 (×4): qty 100

## 2024-03-26 MED ORDER — DIAZEPAM 5 MG PO TABS
10.0000 mg | ORAL_TABLET | Freq: Every evening | ORAL | Status: DC | PRN
  Administered 2024-03-28 – 2024-04-01 (×2): 10 mg via ORAL
  Filled 2024-03-26 (×2): qty 2

## 2024-03-26 MED ORDER — FUROSEMIDE 10 MG/ML IJ SOLN
40.0000 mg | Freq: Two times a day (BID) | INTRAMUSCULAR | Status: DC
Start: 2024-03-26 — End: 2024-03-27
  Administered 2024-03-26 (×2): 40 mg via INTRAVENOUS
  Filled 2024-03-26 (×3): qty 4

## 2024-03-26 MED ORDER — POTASSIUM CHLORIDE CRYS ER 20 MEQ PO TBCR
40.0000 meq | EXTENDED_RELEASE_TABLET | Freq: Two times a day (BID) | ORAL | Status: DC
  Administered 2024-03-26 – 2024-03-30 (×8): 40 meq via ORAL
  Filled 2024-03-26 (×9): qty 2

## 2024-03-26 MED ORDER — POTASSIUM CHLORIDE 20 MEQ PO PACK
40.0000 meq | PACK | Freq: Once | ORAL | Status: AC
  Administered 2024-03-26: 40 meq via ORAL
  Filled 2024-03-26: qty 2

## 2024-03-26 MED ORDER — SODIUM CHLORIDE 0.9 % IV SOLN
500.0000 mg | INTRAVENOUS | Status: AC
  Administered 2024-03-26 – 2024-03-28 (×3): 500 mg via INTRAVENOUS
  Filled 2024-03-26 (×3): qty 5

## 2024-03-26 MED ORDER — CARVEDILOL 3.125 MG PO TABS
3.1250 mg | ORAL_TABLET | Freq: Two times a day (BID) | ORAL | Status: DC
  Administered 2024-03-26 – 2024-03-31 (×11): 3.125 mg via ORAL
  Filled 2024-03-26 (×11): qty 1

## 2024-03-26 MED ORDER — PREDNISONE 20 MG PO TABS
40.0000 mg | ORAL_TABLET | Freq: Every day | ORAL | Status: DC
  Administered 2024-03-26 – 2024-03-28 (×3): 40 mg via ORAL
  Filled 2024-03-26 (×3): qty 2

## 2024-03-26 MED ORDER — VENLAFAXINE HCL ER 75 MG PO CP24
75.0000 mg | ORAL_CAPSULE | Freq: Every day | ORAL | Status: DC
  Administered 2024-03-26 – 2024-04-08 (×14): 75 mg via ORAL
  Filled 2024-03-26 (×14): qty 1

## 2024-03-26 MED ORDER — FLUTICASONE FUROATE-VILANTEROL 200-25 MCG/ACT IN AEPB
1.0000 | INHALATION_SPRAY | Freq: Every day | RESPIRATORY_TRACT | Status: DC
  Administered 2024-03-26 – 2024-04-08 (×14): 1 via RESPIRATORY_TRACT
  Filled 2024-03-26 (×2): qty 28

## 2024-03-26 MED ORDER — SPIRONOLACTONE 25 MG PO TABS
25.0000 mg | ORAL_TABLET | Freq: Every day | ORAL | Status: DC
  Administered 2024-03-26 – 2024-04-04 (×10): 25 mg via ORAL
  Filled 2024-03-26 (×10): qty 1

## 2024-03-26 MED ORDER — MONTELUKAST SODIUM 10 MG PO TABS
10.0000 mg | ORAL_TABLET | Freq: Every day | ORAL | Status: DC
  Administered 2024-03-26 – 2024-04-08 (×14): 10 mg via ORAL
  Filled 2024-03-26 (×14): qty 1

## 2024-03-26 MED ORDER — AMITRIPTYLINE HCL 25 MG PO TABS
25.0000 mg | ORAL_TABLET | Freq: Every day | ORAL | Status: DC
  Administered 2024-03-26 – 2024-04-07 (×13): 25 mg via ORAL
  Filled 2024-03-26 (×13): qty 1

## 2024-03-26 NOTE — Assessment & Plan Note (Addendum)
 With anasarca.  EF 50 to 55%.  Will continue IV Lasix  60 mg IV twice daily.  Continue spironolactone and Cozaar and low-dose Coreg .  Add Jardiance.  Weight down from 250 down to 226.6.

## 2024-03-26 NOTE — Assessment & Plan Note (Addendum)
 Potassium in normal range.  Continue spironolactone and ARB.

## 2024-03-26 NOTE — Progress Notes (Signed)
 Called by pt RN to try to place pt back on Bipap. Pt refused Bipap stated it was to dry and he wanted to drink water while using it. Bipap remains at bedside. RN at bedside and aware.

## 2024-03-26 NOTE — Assessment & Plan Note (Deleted)
 Patient states he does not wear oxygen at home.  Venous blood gas shows a pCO2 of 93.  Qualifies for noninvasive ventilation at home.  Will have TOC look into this.  ABG shows a pCO2 of 91.  Continue BiPAP at night.  Patient also qualifies for oxygen at home with pulse ox dropping into the 70s with ambulation.  Will have to set up home oxygen.  Noninvasive ventilation and oxygen will prevent rehospitalizations with CHF and COPD exacerbations  And improve quality of life.

## 2024-03-26 NOTE — Assessment & Plan Note (Signed)
 Watch creatinine closely with diuresis.

## 2024-03-26 NOTE — Assessment & Plan Note (Addendum)
 Continue Toprol, Cozaar and spironolactone

## 2024-03-26 NOTE — Assessment & Plan Note (Addendum)
 Taper Solu-Medrol .  Continue nebulizer treatments

## 2024-03-26 NOTE — Assessment & Plan Note (Addendum)
 BMI 34.93 with height and weight listed in computer.

## 2024-03-26 NOTE — Progress Notes (Signed)
*  PRELIMINARY RESULTS* Echocardiogram 2D Echocardiogram has been performed.  Miguel Howell 03/26/2024, 2:42 PM

## 2024-03-26 NOTE — Hospital Course (Addendum)
 68 y.o. year old male with past medical history of HTN, HLD, HFpEF, COPD presenting to the ED with worsening shortness of breath.      Pt reports he has been feeling unwell for 3-4 weeks. He has been getting more short of breath but this improves and then gets worse. Over the last few days it has been getting worse.  Patient also has worsening sputum production.     ED Course: On arrival to the ED patient was noted to be HDS stable. CBC without leukocytosis, but macrocytosis and erythrocytosis. BMP with hyponatremia, AKI and elevated bicarb. CXR without any acute findings.   10/22.  TOC consultation for noninvasive ventilation.  Will change BiPAP to nighttime.  Continue IV Lasix .  Echocardiogram shows an EF of 50%.  Replace potassium IV and orally 10/23.  Change Lasix  over to Diamox with CO2 on chemistry greater than 45.  Replace potassium orally. 10/24.  Patient with worsening mental status and pCO2 elevated at 107.  Placed back on continuous BiPAP.  Restarted iv Lasix .  Pulmonary consulted and started high-dose Solu-Medrol . 10/25.  Palliative care consulted and patient made a DNR with intervention (no CPR or chest compressions but may intubate) 10/26.  Patient wanting to come off the BiPAP so he can eat.  Patient states that he has not worked with physical therapy.  Will reconsult physical therapy. 10/27.   Physical therapy changed recommendations to rehab.  pCO2 83 with a pH of 7.34.  Creatinine 0.86 10/28.  This morning's VBG was drawn on oxygen and not the BiPAP.  pCO2 is 99.  This morning's creatinine up to 1.15.  Will switch over to torsemide.

## 2024-03-26 NOTE — Progress Notes (Addendum)
 Progress Note   Patient: Miguel Howell FMW:[id number] DOB: [birth year] DOA: 03/25/2024     1 DOS: the patient was seen and examined on 03/26/2024   Brief hospital course: 68 y.o. year old male with past medical history of HTN, HLD, HFpEF, COPD presenting to the ED with worsening shortness of breath.      Pt reports he has been feeling unwell for 3-4 weeks. He has been getting more short of breath but this improves and then gets worse. Over the last few days it has been getting worse.  Patient also has worsening sputum production.     ED Course: On arrival to the ED patient was noted to be HDS stable. CBC without leukocytosis, but macrocytosis and erythrocytosis. BMP with hyponatremia, AKI and elevated bicarb. CXR without any acute findings.   10/22.  TOC consultation for noninvasive ventilation.  Will change BiPAP to nighttime.  Continue IV Lasix .  Echocardiogram pending.  Assessment and Plan: * Acute respiratory failure with hypoxia and hypercapnia (HCC) Patient states he does not wear oxygen at home.  Venous blood gas shows a pCO2 of 93.  Qualifies for noninvasive ventilation at home.  Will have TOC consultation.  Continue BiPAP at night.  Will get ABG patient presented with respiratory distress as per ER physician.  Will check pulse ox on room air with ambulation.  Acute on chronic diastolic CHF (congestive heart failure) (HCC) With anasarca.  Will give Lasix  40 mg IV twice daily.  COPD with acute exacerbation (HCC) Given Solu-Medrol  and switched over to prednisone .  Hypokalemia IV and oral replacement.  Start spironolactone.  HTN (hypertension) Start Coreg  and spironolactone.  Obesity (BMI 30-39.9) BMI 36.92 with height and weight listed in computer.  CKD stage 3a, GFR 45-59 ml/min (HCC) Watch creatinine closely with diuresis.        Subjective: Patient feeling better than when he came in.  Leg swelling is a little bit better.  Still has some shortness of  breath.  Physical Exam: Vitals:   03/26/24 0857 03/26/24 0900 03/26/24 0950 03/26/24 1237  BP: (!) 161/91 (!) 157/100  (!) 153/80  Pulse: 82 80  77  Resp: 18 15  20   Temp:   97.6 F (36.4 C)   TempSrc:   Oral   SpO2: 97% 97%  96%  Weight:      Height:       Physical Exam HENT:     Head: Normocephalic.     Mouth/Throat:     Pharynx: No oropharyngeal exudate.  Eyes:     General: Lids are normal.     Conjunctiva/sclera: Conjunctivae normal.  Cardiovascular:     Rate and Rhythm: Normal rate and regular rhythm.     Heart sounds: Normal heart sounds, S1 normal and S2 normal.  Pulmonary:     Breath sounds: Examination of the right-lower field reveals decreased breath sounds and rales. Examination of the left-lower field reveals decreased breath sounds and rales. Decreased breath sounds and rales present. No wheezing or rhonchi.  Abdominal:     Palpations: Abdomen is soft.     Tenderness: There is no abdominal tenderness.  Musculoskeletal:     Right lower leg: Swelling present.     Left lower leg: Swelling present.  Skin:    General: Skin is warm.     Findings: No rash.  Neurological:     Mental Status: He is alert and oriented to person, place, and time.     Data Reviewed: Potassium 2.9, creatinine  1.41, white blood cell count 5.7, hemoglobin 12.5, platelet count 177.  BNP 1280  Family Communication: Declined  Disposition: Status is: Inpatient Remains inpatient appropriate because: Will have transitional care team see if we can set up noninvasive ventilation at home.  Will get an ABG to further qualify.  Continue diuresis with IV Lasix .  Planned Discharge Destination: Home    Time spent: 28 minutes  Author: Charlie Patterson, MD 03/26/2024 12:45 PM  For on call review [email or web address].

## 2024-03-27 DIAGNOSIS — J9601 Acute respiratory failure with hypoxia: Secondary | ICD-10-CM | POA: Diagnosis not present

## 2024-03-27 DIAGNOSIS — N179 Acute kidney failure, unspecified: Secondary | ICD-10-CM

## 2024-03-27 DIAGNOSIS — N189 Chronic kidney disease, unspecified: Secondary | ICD-10-CM

## 2024-03-27 DIAGNOSIS — E876 Hypokalemia: Secondary | ICD-10-CM | POA: Diagnosis not present

## 2024-03-27 DIAGNOSIS — J441 Chronic obstructive pulmonary disease with (acute) exacerbation: Secondary | ICD-10-CM | POA: Diagnosis not present

## 2024-03-27 DIAGNOSIS — I5033 Acute on chronic diastolic (congestive) heart failure: Secondary | ICD-10-CM | POA: Diagnosis not present

## 2024-03-27 LAB — BASIC METABOLIC PANEL WITH GFR
BUN: 28 mg/dL — ABNORMAL HIGH (ref 8–23)
CO2: >45 mmol/L — ABNORMAL HIGH (ref 22–32)
Calcium: 9.5 mg/dL (ref 8.9–10.3)
Chloride: 77 mmol/L — ABNORMAL LOW (ref 98–111)
Creatinine, Ser: 1.1 mg/dL (ref 0.61–1.24)
GFR, Estimated: >60 mL/min (ref >60)
Glucose, Bld: 95 mg/dL (ref 70–99)
Potassium: 3 mmol/L — ABNORMAL LOW (ref 3.5–5.1)
Sodium: 135 mmol/L (ref 135–145)

## 2024-03-27 LAB — HEMOGLOBIN A1C
Hgb A1c MFr Bld: 6.4 % — ABNORMAL HIGH (ref 4.8–5.6)
Mean Plasma Glucose: 136.98 mg/dL

## 2024-03-27 LAB — UREA NITROGEN, URINE: Urea Nitrogen, Ur: 132 mg/dL

## 2024-03-27 MED ORDER — ACETAZOLAMIDE SODIUM 500 MG IJ SOLR
500.0000 mg | Freq: Once | INTRAMUSCULAR | Status: AC
  Administered 2024-03-27: 500 mg via INTRAVENOUS
  Filled 2024-03-27: qty 500

## 2024-03-27 MED ORDER — ACETAZOLAMIDE SODIUM 500 MG IJ SOLR
500.0000 mg | Freq: Once | INTRAMUSCULAR | Status: AC
  Administered 2024-03-27: 500 mg via INTRAVENOUS
  Filled 2024-03-27 (×2): qty 500

## 2024-03-27 MED ORDER — LOSARTAN POTASSIUM 25 MG PO TABS
25.0000 mg | ORAL_TABLET | Freq: Every day | ORAL | Status: DC
  Administered 2024-03-27 – 2024-04-04 (×9): 25 mg via ORAL
  Filled 2024-03-27 (×9): qty 1

## 2024-03-27 MED ORDER — IPRATROPIUM-ALBUTEROL 0.5-2.5 (3) MG/3ML IN SOLN
3.0000 mL | Freq: Three times a day (TID) | RESPIRATORY_TRACT | Status: DC
  Administered 2024-03-27 – 2024-03-29 (×5): 3 mL via RESPIRATORY_TRACT
  Filled 2024-03-27 (×5): qty 3

## 2024-03-27 NOTE — ED Notes (Signed)
 Repeat BMP obtained. Lasix  not given due to potassium on previous BMP being low

## 2024-03-27 NOTE — ED Notes (Signed)
 MD at bedside.

## 2024-03-27 NOTE — Assessment & Plan Note (Addendum)
 Creatinine peak 1.37.  Likely underlying chronic kidney disease stage IIIa.  Lowest creatinine creatinine 0.86.  Today's creatinine 1.15.  Switch IV Lasix  over to torsemide.

## 2024-03-27 NOTE — ED Notes (Signed)
 Pt repositioned in bed. Bed alarm on. Sheets changed. Pt placed in a gown.

## 2024-03-27 NOTE — Evaluation (Signed)
 Physical Therapy Evaluation Patient Details Name: Miguel Howell MRN: [id number] DOB: 01-Dec-1955 Today's Date: 03/27/2024  History of Present Illness  Patient is a 68 year old male with worsening shortness of breath. PMH:  HTN, HLD, HFpEF, COPD  Clinical Impression  Patient is groggy but agreeable to PT evaluation. He reports he works part time at lives alone. He has a cane that he uses intermittently that is in the room.  Today the patient ambulated a short distance with and without oxygen. Sp02 down to 77% on room air and patient reports feeling mildly dizzy. Sp02 up to 90% with walking on 4 L02. Mild trunk sway and general unsteadiness with standing activity without the cane. He does not appear to be at his baseline level function. PT will continue to follow to maximize independence and facilitate return to prior level of function.       If plan is discharge home, recommend the following: Assist for transportation   Can travel by private vehicle        Equipment Recommendations None recommended by PT  Recommendations for Other Services       Functional Status Assessment Patient has had a recent decline in their functional status and demonstrates the ability to make significant improvements in function in a reasonable and predictable amount of time.     Precautions / Restrictions Precautions Precautions: Fall Recall of Precautions/Restrictions: Intact Restrictions Weight Bearing Restrictions Per Provider Order: No      Mobility  Bed Mobility Overal bed mobility: Needs Assistance Bed Mobility: Supine to Sit, Sit to Supine     Supine to sit: Min assist Sit to supine: Supervision   General bed mobility comments: trunk support provided to sit upright    Transfers Overall transfer level: Needs assistance Equipment used: None Transfers: Sit to/from Stand Sit to Stand: Contact guard assist           General transfer comment: CGA for safety with mild trunk sway with  standing    Ambulation/Gait Ambulation/Gait assistance: Supervision, Contact guard assist Gait Distance (Feet): 12 Feet Assistive device: Straight cane Gait Pattern/deviations: Decreased stride length, Narrow base of support       General Gait Details: was asked by MD to ambulate on room air. short distance ambulation performed using his own cane. no loss of balance. activity tolerance limited by dizziness with activity. Sp02 77% on room air, increasing to 90% on 4 L02.  Stairs            Wheelchair Mobility     Tilt Bed    Modified Rankin (Stroke Patients Only)       Balance Overall balance assessment: Needs assistance Sitting-balance support: Feet supported Sitting balance-Leahy Scale: Good     Standing balance support: Single extremity supported Standing balance-Leahy Scale: Fair Standing balance comment: mild trunk sway without at least unilateral UE support                             Pertinent Vitals/Pain Pain Assessment Pain Assessment: No/denies pain    Home Living Family/patient expects to be discharged to:: Private residence Living Arrangements: Alone   Type of Home: House Home Access: Stairs to enter Entrance Stairs-Rails: None Entrance Stairs-Number of Steps: 5   Home Layout: One level Home Equipment: Cane - single point      Prior Function Prior Level of Function : Independent/Modified Independent;Working/employed;Driving  Mobility Comments: PRN use of cane. works part time       Radio producer Extremity Assessment Upper Extremity Assessment: Overall WFL for tasks assessed    Lower Extremity Assessment Lower Extremity Assessment: Generalized weakness (endurance imparied for sustained activity)       Communication   Communication Communication: No apparent difficulties Factors Affecting Communication: Reduced clarity of speech (low tone of voice (whispers most of the time))     Cognition Arousal: Lethargic Behavior During Therapy: Flat affect   PT - Cognitive impairments: No family/caregiver present to determine baseline                       PT - Cognition Comments: patient is groggy throughout session, keeps eyes closed intermittently. upset that he has not been given any food Following commands: Impaired Following commands impaired: Follows one step commands with increased time     Cueing Cueing Techniques: Verbal cues     General Comments General comments (skin integrity, edema, etc.): patient finishing a breathing treatment on arrival to room. Sp02 90% on room air at rest, 77% on room air with ambulation, 90% on 4 L02 with ambulation. was at 95% at rest on 4 L at end of session    Exercises     Assessment/Plan    PT Assessment Patient needs continued PT services  PT Problem List Decreased strength;Decreased range of motion;Decreased activity tolerance;Decreased balance;Decreased mobility;Cardiopulmonary status limiting activity       PT Treatment Interventions DME instruction;Gait training;Stair training;Functional mobility training;Therapeutic activities;Therapeutic exercise;Balance training;Patient/family education    PT Goals (Current goals can be found in the Care Plan section)  Acute Rehab PT Goals Patient Stated Goal: home PT Goal Formulation: With patient Time For Goal Achievement: 04/10/24 Potential to Achieve Goals: Fair    Frequency Min 1X/week     Co-evaluation               AM-PAC PT 6 Clicks Mobility  Outcome Measure Help needed turning from your back to your side while in a flat bed without using bedrails?: None Help needed moving from lying on your back to sitting on the side of a flat bed without using bedrails?: A Little Help needed moving to and from a bed to a chair (including a wheelchair)?: A Little Help needed standing up from a chair using your arms (e.g., wheelchair or bedside chair)?: A  Little Help needed to walk in hospital room?: A Little Help needed climbing 3-5 steps with a railing? : A Little 6 Click Score: 19    End of Session Equipment Utilized During Treatment: Oxygen Activity Tolerance: Patient limited by fatigue Patient left: in bed;with call bell/phone within reach;with bed alarm set Nurse Communication: Mobility status PT Visit Diagnosis: Muscle weakness (generalized) (M62.81);Unsteadiness on feet (R26.81)    Time: 9148-9089 PT Time Calculation (min) (ACUTE ONLY): 19 min   Charges:   PT Evaluation $PT Eval Moderate Complexity: 1 Mod   PT General Charges $$ ACUTE PT VISIT: 1 Visit         Randine Essex, PT, MPT   [name] 03/27/2024, 9:27 AM

## 2024-03-27 NOTE — Plan of Care (Signed)
   Problem: Clinical Measurements: Goal: Respiratory complications will improve Outcome: Progressing   Problem: Activity: Goal: Risk for activity intolerance will decrease Outcome: Progressing   Problem: Safety: Goal: Ability to remain free from injury will improve Outcome: Progressing

## 2024-03-27 NOTE — Progress Notes (Signed)
 Patient will now have acute on chronic respiratory failure since he qualifies for oxygen at home.  Hypoxic and hypercapnic  Due to the severity of the patient's chronic respiratory failure, secondary to COPD, the patient requires volume targeted pressure support from Home Mechanical Ventilation. BIPAP, BIPAP ST/ STA (RAD) have been ruled out. RAD with or without back up rate does not supply volume targeted ventilation to manage the patients Hypercapnia. OSA is not the predominant cause of their hypercapnia, and HMV is not being ordered to treat OSA. The patient requires ventilatory support for more than eight hours per 24-hour period to normalize the patient's PaCO2 and prevent readmissions. Failure to adequately ventilate will result in serious harm and/or death to the patient.   Dr. Charlie Patterson

## 2024-03-27 NOTE — Progress Notes (Signed)
 SATURATION QUALIFICATIONS: (This note is used to comply with regulatory documentation for home oxygen)  Patient Saturations on Room Air at Rest = 90%  Patient Saturations on Room Air while Ambulating = 77%  Patient Saturations on 4 Liters of oxygen while Ambulating = 90%   Randine Essex, PT, MPT

## 2024-03-27 NOTE — TOC Progression Note (Signed)
 Transition of Care [facility]) - Progression Note    Patient Details  Name: REMMINGTON URIETA MRN: [id number] Date of Birth: 04/13/1956  Transition of Care [facility]) CM/SW Contact  [place] L Lion Fernandez, [place] Phone Number: 03/27/2024, 2:53 PM  Clinical Narrative:     NIV and oxygen processed through [employer]. Patient will not be ready for discharge until tomorrow possibly.                     Expected Discharge Plan and Services                                               Social Drivers of Health (SDOH) Interventions SDOH Screenings   Food Insecurity: No Food Insecurity (05/07/2023)   Received from [facility]  Housing: Unknown (09/25/2023)   Received from [facility]  Transportation Needs: No Transportation Needs (05/07/2023)   Received from [facility]  Utilities: Not At Risk (05/07/2023)   Received from [facility]  Financial Resource Strain: Low Risk  (05/07/2023)   Received from [facility]  Tobacco Use: High Risk (03/25/2024)    Readmission Risk Interventions     No data to display

## 2024-03-27 NOTE — Progress Notes (Signed)
 Progress Note   Patient: Miguel Howell FMW:[id number] DOB: February 11, 1956 DOA: 03/25/2024     2 DOS: the patient was seen and examined on 03/27/2024   Brief hospital course: 68 y.o. year old male with past medical history of HTN, HLD, HFpEF, COPD presenting to the ED with worsening shortness of breath.      Pt reports he has been feeling unwell for 3-4 weeks. He has been getting more short of breath but this improves and then gets worse. Over the last few days it has been getting worse.  Patient also has worsening sputum production.     ED Course: On arrival to the ED patient was noted to be HDS stable. CBC without leukocytosis, but macrocytosis and erythrocytosis. BMP with hyponatremia, AKI and elevated bicarb. CXR without any acute findings.   10/22.  TOC consultation for noninvasive ventilation.  Will change BiPAP to nighttime.  Continue IV Lasix .  Echocardiogram shows an EF of 50%.  Replace potassium IV and orally 10/23.  Change Lasix  over to Diamox with CO2 on chemistry greater than 45.  Replace potassium orally.  Assessment and Plan: * Acute respiratory failure with hypoxia and hypercapnia (HCC) Patient states he does not wear oxygen at home.  Venous blood gas shows a pCO2 of 93.  Qualifies for noninvasive ventilation at home.  Will have TOC look into this.  ABG shows a pCO2 of 91.  Continue BiPAP at night.  Patient also qualifies for oxygen at home with pulse ox dropping into the 70s with ambulation.  Will have to set up home oxygen.  Noninvasive ventilation and oxygen will prevent rehospitalizations with CHF and COPD exacerbations  And improve quality of life.  Acute on chronic diastolic CHF (congestive heart failure) (HCC) With anasarca.  With CO2 rising I will switch over to Diamox for diuresis.  Start spironolactone and we will add Cozaar.  COPD with acute exacerbation (HCC) Given Solu-Medrol  and switched over to prednisone .  Hypokalemia Oral placement.  Continue  spironolactone.  HTN (hypertension) Start Coreg  and spironolactone.  Acute kidney injury superimposed on CKD Creatinine improved to 1.1 with diuresis, creatinine peak 1.37.  Likely underlying chronic kidney disease stage IIIa.  Obesity (BMI 30-39.9) BMI 36.92 with height and weight listed in computer.        Subjective: Patient feeling a little bit better.  States the swelling is gone and breathing little bit better.  With physical therapy did qualify for home oxygen.  Seeing if transitional care team can set up for noninvasive ventilation.  Physical Exam: Vitals:   03/27/24 0830 03/27/24 1035 03/27/24 1100 03/27/24 1151  BP: (!) 161/91 (!) 146/87 (!) 147/86   Pulse: 81 98 83   Resp: 19 11 18    Temp:    98.1 F (36.7 C)  TempSrc:    Oral  SpO2: 100% 93% 97%   Weight:      Height:       Physical Exam HENT:     Head: Normocephalic.     Mouth/Throat:     Pharynx: No oropharyngeal exudate.  Eyes:     General: Lids are normal.     Conjunctiva/sclera: Conjunctivae normal.  Cardiovascular:     Rate and Rhythm: Normal rate and regular rhythm.     Heart sounds: Normal heart sounds, S1 normal and S2 normal.  Pulmonary:     Breath sounds: Examination of the right-lower field reveals decreased breath sounds and wheezing. Examination of the left-lower field reveals decreased breath sounds and wheezing.  Decreased breath sounds and wheezing present. No rhonchi or rales.  Abdominal:     Palpations: Abdomen is soft.     Tenderness: There is no abdominal tenderness.  Musculoskeletal:     Right lower leg: Swelling present.     Left lower leg: Swelling present.  Skin:    General: Skin is warm.     Findings: No rash.  Neurological:     Mental Status: He is alert and oriented to person, place, and time.     Data Reviewed: Sodium 135, potassium 3.0, creatinine 1.1, CO2 greater than 45  Family Communication: Declined  Disposition: Status is: Inpatient Remains inpatient  appropriate because: Continue diuresis today but will switch over to Diamox.  Planned Discharge Destination: Home with Home Health    Time spent: 28 minutes  Author: Charlie Patterson, MD 03/27/2024 11:55 AM  For on call review [email or web address].

## 2024-03-28 ENCOUNTER — Inpatient Hospital Stay

## 2024-03-28 ENCOUNTER — Telehealth (HOSPITAL_COMMUNITY): Payer: Self-pay | Admitting: Pharmacy Technician

## 2024-03-28 ENCOUNTER — Other Ambulatory Visit (HOSPITAL_COMMUNITY): Payer: Self-pay

## 2024-03-28 DIAGNOSIS — E876 Hypokalemia: Secondary | ICD-10-CM | POA: Diagnosis not present

## 2024-03-28 DIAGNOSIS — J9622 Acute and chronic respiratory failure with hypercapnia: Secondary | ICD-10-CM | POA: Diagnosis not present

## 2024-03-28 DIAGNOSIS — M351 Other overlap syndromes: Secondary | ICD-10-CM | POA: Diagnosis not present

## 2024-03-28 DIAGNOSIS — J9621 Acute and chronic respiratory failure with hypoxia: Secondary | ICD-10-CM | POA: Diagnosis not present

## 2024-03-28 DIAGNOSIS — J441 Chronic obstructive pulmonary disease with (acute) exacerbation: Secondary | ICD-10-CM | POA: Diagnosis not present

## 2024-03-28 DIAGNOSIS — I5033 Acute on chronic diastolic (congestive) heart failure: Secondary | ICD-10-CM | POA: Diagnosis not present

## 2024-03-28 LAB — BASIC METABOLIC PANEL WITH GFR
Anion gap: 12 (ref 5–15)
BUN: 31 mg/dL — ABNORMAL HIGH (ref 8–23)
CO2: 41 mmol/L — ABNORMAL HIGH (ref 22–32)
Calcium: 9.7 mg/dL (ref 8.9–10.3)
Chloride: 80 mmol/L — ABNORMAL LOW (ref 98–111)
Creatinine, Ser: 1.22 mg/dL (ref 0.61–1.24)
GFR, Estimated: >60 mL/min (ref >60)
Glucose, Bld: 167 mg/dL — ABNORMAL HIGH (ref 70–99)
Potassium: 3.2 mmol/L — ABNORMAL LOW (ref 3.5–5.1)
Sodium: 133 mmol/L — ABNORMAL LOW (ref 135–145)

## 2024-03-28 LAB — CBC
HCT: 58 % — ABNORMAL HIGH (ref 39.0–52.0)
Hemoglobin: 17.8 g/dL — ABNORMAL HIGH (ref 13.0–17.0)
MCH: 35.1 pg — ABNORMAL HIGH (ref 26.0–34.0)
MCHC: 30.7 g/dL (ref 30.0–36.0)
MCV: 114.4 fL — ABNORMAL HIGH (ref 80.0–100.0)
Platelets: 117 K/uL — ABNORMAL LOW (ref 150–400)
RBC: 5.07 MIL/uL (ref 4.22–5.81)
RDW: 14.4 % (ref 11.5–15.5)
WBC: 8.4 K/uL (ref 4.0–10.5)
nRBC: 0.4 % — ABNORMAL HIGH (ref 0.0–0.2)

## 2024-03-28 LAB — BLOOD GAS, VENOUS
Acid-Base Excess: 21.6 mmol/L — ABNORMAL HIGH (ref 0.0–2.0)
Bicarbonate: 53.9 mmol/L — ABNORMAL HIGH (ref 20.0–28.0)
O2 Saturation: 66.6 %
Patient temperature: 37
pCO2, Ven: 107 mmHg (ref 44–60)
pH, Ven: 7.31 (ref 7.25–7.43)
pO2, Ven: 41 mmHg (ref 32–45)

## 2024-03-28 LAB — EXPECTORATED SPUTUM ASSESSMENT W GRAM STAIN, RFLX TO RESP C: Special Requests: NORMAL

## 2024-03-28 LAB — GLUCOSE, CAPILLARY: Glucose-Capillary: 174 mg/dL — ABNORMAL HIGH (ref 70–99)

## 2024-03-28 MED ORDER — METHYLPREDNISOLONE SODIUM SUCC 125 MG IJ SOLR
60.0000 mg | Freq: Four times a day (QID) | INTRAMUSCULAR | Status: DC
  Administered 2024-03-28 – 2024-03-31 (×11): 60 mg via INTRAVENOUS
  Filled 2024-03-28 (×12): qty 2

## 2024-03-28 MED ORDER — TORSEMIDE 20 MG PO TABS: 20.0000 mg | ORAL_TABLET | Freq: Every day | ORAL | Status: DC

## 2024-03-28 MED ORDER — FLUCONAZOLE 100MG IVPB
100.0000 mg | INTRAVENOUS | Status: DC
  Filled 2024-03-28: qty 50

## 2024-03-28 MED ORDER — FLUCONAZOLE IN SODIUM CHLORIDE 200-0.9 MG/100ML-% IV SOLN
200.0000 mg | Freq: Once | INTRAVENOUS | Status: AC
  Administered 2024-03-28: 200 mg via INTRAVENOUS
  Filled 2024-03-28: qty 100

## 2024-03-28 MED ORDER — FLUCONAZOLE 100MG IVPB
100.0000 mg | INTRAVENOUS | Status: DC
  Administered 2024-03-29 – 2024-03-30 (×2): 100 mg via INTRAVENOUS
  Filled 2024-03-28 (×2): qty 50

## 2024-03-28 MED ORDER — POTASSIUM CHLORIDE 10 MEQ/100ML IV SOLN
10.0000 meq | INTRAVENOUS | Status: AC
  Administered 2024-03-28 (×2): 10 meq via INTRAVENOUS
  Filled 2024-03-28 (×2): qty 100

## 2024-03-28 MED ORDER — FUROSEMIDE 10 MG/ML IJ SOLN
40.0000 mg | Freq: Every day | INTRAMUSCULAR | Status: DC
  Administered 2024-03-28: 40 mg via INTRAVENOUS
  Filled 2024-03-28: qty 4

## 2024-03-28 MED ORDER — POTASSIUM CHLORIDE CRYS ER 20 MEQ PO TBCR: 40.0000 meq | EXTENDED_RELEASE_TABLET | Freq: Once | ORAL | Status: DC

## 2024-03-28 NOTE — Care Management Important Message (Signed)
 Important Message  Patient Details  Name: Miguel Howell MRN: [id number] Date of Birth: 11-17-1955   Important Message Given:  Yes - Medicare IM     Miguel Howell 03/28/2024, 1:31 PM

## 2024-03-28 NOTE — Consult Note (Signed)
 Pulmonary Critical Care  Initial Consult Note  Miguel Howell FMW:[id number] DOB: 1956/04/14 DOA: 03/25/2024  Referring physician: Dr. Doyne  Chief Complaint: Respiratory failure  HPI: Miguel Howell is a 68 y.o. male who has multiple medical problems including hypertension hyperlipidemia heart failure preserved ejection fraction severe COPD probable obstructive sleep apnea who came into the hospital with increasing shortness of breath.  At the time of evaluation he appeared to be somnolent speaking with only whispering words.  Patient states he was short of breath.  He had been seen earlier by the hospitalist team and a blood gas was drawn where the pH was noted to be 7.31 with a pCO2 over 100.  Patient was started on BiPAP and follow-up ABG done around 3 this afternoon showed that his pH was 7.29 he was still awake but appeared to be somnolent.  Patient is supposed to go back on the BiPAP.  He denies having any chest pain there is no palpitations.  Denies having any nausea vomiting no fevers.  He was not sure if he has sleep apnea.  He states he does smoke and states that a pack lasts him a couple of weeks.  Review of Systems:  Constitutional:  No weight loss, night sweats, Fevers, chills, fatigue.  HEENT:  No headaches, nasal congestion, post nasal drip,  Cardio-vascular:  No chest pain, Orthopnea, PND, swelling in lower extremities, anasarca, dizziness, palpitations  GI:  No heartburn, indigestion, abdominal pain, nausea, vomiting, diarrhea  Resp:  No shortness of breath with exertion or at rest. no productive cough, No coughing up of blood.No wheezing Skin:  no rash or lesions.  Musculoskeletal:  No joint pain or swelling.   Remainder ROS performed and is unremarkable other than noted in HPI  Past Medical History:  Diagnosis Date   Arthritis    bilateral hips and back   Asthma    CHF (congestive heart failure) (HCC)    COPD (chronic obstructive pulmonary disease) (HCC)     Dyspnea    Hypertension    Past Surgical History:  Procedure Laterality Date   HERNIA REPAIR     TOTAL HIP ARTHROPLASTY Right 09/01/2021   Procedure: TOTAL HIP ARTHROPLASTY ANTERIOR APPROACH;  Surgeon: Kathlynn Sharper, MD;  Location: [facility] ORS;  Service: Orthopedics;  Laterality: Right;   Social History:  reports that he has been smoking cigarettes. He started smoking about 48 years ago. He has a 60 pack-year smoking history. He has quit using smokeless tobacco. He reports that he does not currently use alcohol. He reports that he does not use drugs.  No Known Allergies  Family History  Problem Relation Age of Onset   Diabetes Mother    Diabetes Father    COPD Father    Heart attack Father     Prior to Admission medications   Medication Sig Start Date End Date Taking? Authorizing Provider  acetaminophen  (TYLENOL ) 500 MG tablet Take 500 mg by mouth every 6 (six) hours as needed for fever, headache, moderate pain or mild pain.   Yes [provider]  ADVAIR HFA 230-21 MCG/ACT inhaler Inhale 2 puffs into the lungs 2 (two) times daily. 07/30/21  Yes [provider]  albuterol  (PROVENTIL  HFA;VENTOLIN  HFA) 108 (90 Base) MCG/ACT inhaler Inhale 2 puffs into the lungs every 4 (four) hours as needed for wheezing or shortness of breath.   Yes [provider]  amitriptyline (ELAVIL) 25 MG tablet Take 25 mg by mouth at bedtime. 04/01/23  Yes [provider]  amoxicillin -clavulanate (AUGMENTIN ) 875-125 MG tablet Take 1 tablet by mouth 2 (two) times daily. 03/18/24  Yes [provider]  ARGININE PO Take 1 capsule by mouth 2 (two) times daily. With citrulline   Yes [provider]  aspirin  EC 81 MG tablet Take 81 mg by mouth daily.   Yes [provider]  calcium  carbonate (TUMS - DOSED IN MG ELEMENTAL CALCIUM ) 500 MG chewable tablet Chew 1 tablet by mouth daily as needed for indigestion or heartburn.   Yes [provider]  carvedilol   (COREG ) 6.25 MG tablet Take 3.125 mg by mouth 2 (two) times daily.    Yes [provider]  desvenlafaxine (PRISTIQ) 50 MG 24 hr tablet Take 50 mg by mouth daily. 01/10/24  Yes [provider]  diazepam  (VALIUM ) 10 MG tablet Take 10 mg by mouth at bedtime as needed for anxiety or sleep.    Yes [provider]  furosemide  (LASIX ) 40 MG tablet Take 1 tablet (40 mg total) by mouth daily. 06/12/16  Yes Vaickute, Rima, MD  metolazone (ZAROXOLYN) 2.5 MG tablet Take 2.5 mg by mouth daily. 03/17/24 03/17/25 Yes [provider]  montelukast  (SINGULAIR ) 10 MG tablet Take 10 mg by mouth daily.   Yes [provider]  potassium chloride  (K-DUR,KLOR-CON ) 10 MEQ tablet Take 1 tablet (10 mEq total) by mouth 2 (two) times daily. 06/12/16  Yes Vaickute, Rima, MD  predniSONE  (DELTASONE ) 10 MG tablet Take by mouth. 03/18/24  Yes [provider]  PSYLLIUM HUSK PO Take 2 capsules by mouth 2 (two) times daily.   Yes [provider]  Tiotropium Bromide  Monohydrate (SPIRIVA  RESPIMAT) 2.5 MCG/ACT AERS Inhale 1 puff into the lungs 2 (two) times daily.   Yes [provider]  NAPOLEON INHUB 500-50 MCG/ACT AEPB Inhale 1 puff into the lungs in the morning and at bedtime. 11/27/23  Yes [provider]  docusate sodium  (COLACE) 100 MG capsule Take 1 capsule (100 mg total) by mouth 2 (two) times daily. 09/02/21   Charlene Debby BROCKS, PA-C  nabumetone  (RELAFEN ) 500 MG tablet Take 500 mg by mouth 2 (two) times daily. Patient not taking: Reported on 03/25/2024 07/28/21   [provider]   Physical Exam: Vitals:   03/28/24 1309 03/28/24 1530 03/28/24 1704 03/28/24 1721  BP: 110/73  103/65 94/62  Pulse: 68  74 74  Resp:   (!) 21 18  Temp: 97.6 F (36.4 C)  (!) 96.8 F (36 C) 97.8 F (36.6 C)  TempSrc:   Axillary   SpO2: 100% 96% 95% 94%  Weight:      Height:       Body mass index is 34.35 kg/m.   Wt Readings from Last 3 Encounters:  03/28/24  105.5 kg  10/16/21 97.5 kg  09/01/21 99.8 kg    General:  Appears calm and comfortable Eyes: PERRL, normal lids, irises & conjunctiva ENT: grossly normal hearing, lips & tongue Neck: no LAD, masses or thyromegaly Cardiovascular: RRR, no m/r/g. No LE edema. Respiratory: CTA bilaterally, no w/r/r.       Normal respiratory effort. Abdomen: soft, nontender Skin: no rash or induration seen on limited exam Musculoskeletal: grossly normal tone BUE/BLE Psychiatric: grossly normal mood and affect Neurologic: grossly non-focal.          Labs on Admission:  Basic Metabolic Panel: Recent Labs  Lab 03/25/24 1149 03/25/24 1905 03/26/24 0331 03/27/24 0748 03/28/24 0545  NA 132*  --  133* 135 133*  K 4.1  --  2.4* 3.0* 3.2*  CL 69*  --  68* 77* 80*  CO2 42*  --  45* >45* 41*  GLUCOSE 137*  --  151* 95 167*  BUN 36*  --  34* 28* 31*  CREATININE 1.31*  --  1.37* 1.10 1.22  CALCIUM  10.2  --  9.5 9.5 9.7  MG  --  2.5* 2.1  --   --    Liver Function Tests: No results for input(s): AST, ALT, ALKPHOS, BILITOT, PROT, ALBUMIN in the last 168 hours. No results for input(s): LIPASE, AMYLASE in the last 168 hours. No results for input(s): AMMONIA in the last 168 hours. CBC: Recent Labs  Lab 03/25/24 1149 03/26/24 0331 03/28/24 0545  WBC 5.8 4.1 8.4  HGB 18.7* 18.0* 17.8*  HCT 58.1* 55.5* 58.0*  MCV 107.6* 107.6* 114.4*  PLT 143* 126* 117*   Cardiac Enzymes: No results for input(s): CKTOTAL, CKMB, CKMBINDEX, TROPONINI in the last 168 hours.  BNP (last 3 results) Recent Labs    03/25/24 1149  BNP 1,280.7*    ProBNP (last 3 results) No results for input(s): PROBNP in the last 8760 hours.  CBG: Recent Labs  Lab 03/28/24 1739  GLUCAP 174*    Radiological Exams on Admission: No results found.  EKG: Independently reviewed.  Assessment/Plan Principal Problem:   Acute on chronic respiratory failure with hypoxia and hypercapnia (HCC) Active  Problems:   Acute on chronic diastolic CHF (congestive heart failure) (HCC)   COPD with acute exacerbation (HCC)   Hypokalemia   HTN (hypertension)   Acute kidney injury superimposed on CKD   Obesity (BMI 30-39.9)   Acute on chronic respiratory failure with hypoxia and hypercapnia.  At this time the patient is awake, his status is somewhat tenuous.  I went ahead and spoke with the ICU team that if he has any further decompensation he should probably be monitored in the ICU.  I did also speak to the patient regarding CODE STATUS and intubation.  He stated he would talk to his sister.  In the meantime we should continue with the BiPAP as tolerated make adjustments according to his blood gases.  And I would recommend doing a repeat arterial blood gas to monitor his pCO2.  If he continues to worsen Dr. Joanie and the ICU team is already been made aware and they will take a look at him hopefully over the weekend. Severe COPD.  Patient was on oral prednisone  and also was on inhaled steroids.  I went ahead and switched him over to IV steroids Solu-Medrol  60 mg Q6 this may hopefully help him. 's heart failure preserved ejection fraction cardiology consultation and their recommendation.  Patient's overall prognosis is quite guarded.  Code Status: Full code  Family Communication: No family present Disposition Plan: Home  Time spent: 24  I have personally obtained a history, examined the patient, evaluated laboratory and imaging results, formulated the assessment and plan and placed orders.  The Patient requires high complexity decision making for assessment and support. Total Time Spent   Elfreda DELENA Bathe, MD [facility] Pulmonary Critical Care Medicine Sleep Medicine

## 2024-03-28 NOTE — Progress Notes (Signed)
 Patient is refusing Bipap at the moment while his sisters are visiting. Education was provided to the patient and family that the patient will be intubated if he continues to be non-compliant with the Bipap. Patient and visitors state understanding. Patient states he will continue the Bipap when they leave. RT notified.

## 2024-03-28 NOTE — TOC Progression Note (Addendum)
 Transition of Care [facility]) - Progression Note    Patient Details  Name: Miguel Howell MRN: [id number] Date of Birth: 03-Aug-1955  Transition of Care [facility]) CM/SW Contact  Victory Jackquline RAMAN, RN Phone Number: 03/28/2024, 11:11 AM  Clinical Narrative:   RNCM received a call from Mitch with [employer] informing me that NIV will be delivered today between 2pm & 3pm. MD and RN made aware.Pt not discharging to [facility] per MD. Pt being placed on BiPAP contniuous with high pco2. RNCM will continue to follow for discharge planning/care coordination and update as applicable.   4:23pm: Received a call from [facility] with [employer] informing me that they were unable to deliver the NIV. The RT wasn't able to arouse patient to have him sign for the equipment or for him to be given instructions on how to use the machine and or let them know how he felt with the setting adjustments. They will try again once the patient is alert.   Per MD patient's discharge was placed on hold until medically stable for discharge.                   Expected Discharge Plan and Services                                               Social Drivers of Health (SDOH) Interventions SDOH Screenings   Food Insecurity: No Food Insecurity (03/27/2024)  Housing: Low Risk  (03/27/2024)  Transportation Needs: No Transportation Needs (03/27/2024)  Utilities: Not At Risk (03/27/2024)  Financial Resource Strain: Low Risk  (05/07/2023)   Received from [facility]  Social Connections: Unknown (03/27/2024)  Tobacco Use: High Risk (03/25/2024)    Readmission Risk Interventions     No data to display

## 2024-03-28 NOTE — Progress Notes (Signed)
 Pco2 elevated at 107 again, spoke with respiratory and she will changed the bipap machine and rate and we will recheck at 6pm  Dr Josette

## 2024-03-28 NOTE — Telephone Encounter (Signed)
 Patient Product/process development scientist completed.    The patient is insured through [employer]/ [employer]. Patient has Medicare and is not eligible for a copay card, but may be able to apply for patient assistance or Medicare RX Payment Plan (Patient Must reach out to their plan, if eligible for payment plan), if available.    Ran test claim for Farxiga 10 mg and the current 30 day co-pay is $0.00.  Ran test claim for Jardiance 10 mg and the current 30 day co-pay is $0.00.  This test claim was processed through [facility]- copay amounts may vary at other pharmacies due to pharmacy/plan contracts, or as the patient moves through the different stages of their insurance plan.     Miguel Howell, CPHT Pharmacy Technician Patient Advocate Specialist [facility] Patient Advocate Team Direct Number: [phone]  Fax: [phone]

## 2024-03-28 NOTE — Plan of Care (Signed)

## 2024-03-28 NOTE — Assessment & Plan Note (Addendum)
 Patient states he does not wear oxygen at home.  Venous blood gas shows a pCO2 of 93.  Qualifies for noninvasive ventilation at home.  Will have TOC look into this.  ABG shows a pCO2 of 91.  Continue BiPAP at night.  Patient also qualifies for oxygen at home with pulse ox dropping into the 70s with ambulation.  Will have to set up home oxygen.  10/24.  I had difficulty waking him up this morning.  Patient had some difficulty taking pills this morning with nursing staff.  Venous blood gas shows a pCO2 of 107 with a pH of 7.31.  Placed on continuous BiPAP with higher pressures than he previously was on last night.  Appreciate pulmonary consultation.  10/25.  Made limited code with palliative care.   10/27.  Patient seen on nasal cannula this morning.  Wore the BiPAP last night.  pCO2 this morning down to 83. 10/28 pCO2 99 but this was done on nasal cannula not the BiPAP as per respiratory.

## 2024-03-28 NOTE — Progress Notes (Signed)
 Heart Failure Stewardship Pharmacy Note  PCP: Auston Reyes BIRCH, MD PCP-Cardiologist: None  HPI: [place] is a 68 y.o. male with HTN, HLD, HFpEF, COPD who presented with progressive shortness of breath over 3-4 weeks with sputum production. On admission, BNP was 1280.7, HS-troponin was 79, and A1c 6.4. Chest x-ray noted no active cardiopulmonary disease.   Pertinent cardiac history: TTE 06/2016 showed LVEF of 65-70% with G2DD. TTE 03/2024 noted LVEF 50-55%, mild LVH, and indeterminate diastolic dysfunction.  Pertinent Lab Values: Creatinine, Ser  Date Value Ref Range Status  03/28/2024 1.22 0.61 - 1.24 mg/dL Final   BUN  Date Value Ref Range Status  03/28/2024 31 (H) 8 - 23 mg/dL Final   Potassium  Date Value Ref Range Status  03/28/2024 3.2 (L) 3.5 - 5.1 mmol/L Final   Sodium  Date Value Ref Range Status  03/28/2024 133 (L) 135 - 145 mmol/L Final   B Natriuretic Peptide  Date Value Ref Range Status  03/25/2024 1,280.7 (H) 0.0 - 100.0 pg/mL Final    Comment:    Performed at [facility], [address] [place], [place] [address]   Magnesium   Date Value Ref Range Status  03/26/2024 2.1 1.7 - 2.4 mg/dL Final    Comment:    Performed at [facility], [address] [place], [place] [address]   Hgb A1c MFr Bld  Date Value Ref Range Status  03/26/2024 6.4 (H) 4.8 - 5.6 % Final    Comment:    (NOTE) Diagnosis of Diabetes The following HbA1c ranges recommended by the American Diabetes Association (ADA) may be used as an aid in the diagnosis of diabetes mellitus.  Hemoglobin             Suggested A1C NGSP%              Diagnosis  <5.7                   Non Diabetic  5.7-6.4                Pre-Diabetic  >6.4                   Diabetic  <7.0                   Glycemic control for                       adults with diabetes.      Vital Signs: Temp:  [97.5 F (36.4 C)-98.3 F (36.8 C)] 97.5 F (36.4 C) (10/24 0517) Pulse Rate:   [77-98] 94 (10/24 0517) Cardiac Rhythm: Normal sinus rhythm (10/23 2300) Resp:  [11-20] 18 (10/24 0517) BP: (96-171)/(53-113) 105/61 (10/24 0517) SpO2:  [93 %-100 %] 95 % (10/24 0517) FiO2 (%):  [28 %] 28 % (10/24 0220) Weight:  [105.5 kg (232 lb 9.4 oz)] 105.5 kg (232 lb 9.4 oz) (10/24 0500)  Intake/Output Summary (Last 24 hours) at 03/28/2024 0719 Last data filed at 03/27/2024 2359 Gross per 24 hour  Intake 0 ml  Output 900 ml  Net -900 ml    Current Heart Failure Medications:  Loop diuretic: none Beta-Blocker: carvedilol  3.125 mg BID ACEI/ARB/ARNI: losartan 25 mg daily MRA: spironolactone 25 mg daily SGLT2i: none Other: none  Prior to admission Heart Failure Medications:  Loop diuretic: furosemide  40 mg daily Beta-Blocker: carvedilol  3.125 mg BID ACEI/ARB/ARNI: none MRA: none SGLT2i: none Other: none  Assessment: 1.  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 NYHA class III-IV symptoms.  -Symptoms: Patient was asleep and very difficult to wake. Lethargic upon awakening. Patient is requiring 2L O2 at this time. Not very conversational.  -Volume: Volume is difficult to assess due to body habitus. Remaining symptoms are more likely due to chronic respiratory failure as opposed to hypervolemia. Agree with holding furosemide  today. Discussed with MD and patient may benefit from transition to torsemide eventually, when metabolic derangements improve. -Hemodynamics: BP is stable. HR 80s. -BB: Currently on carvedilol  3.125 mg BID. Given COPD, patient may benefit from stopping or transitioning to metoprolol or bisoprolol instead. -ACEI/ARB/ARNI: Continue losartan 25 mg daily. -MRA: Continue spironolactone 25 mg daily -SGLT2i: Consider adding Farxiga or Jardiance pending cost  Plan: 1) Medication changes recommended at this time: -Consider an additional 40 meq of potassium today with currently ordered BID dosing -Consider adding Farxiga or Jardiance 10  mg daily pending cost -Consdier transitioning to an equivalent dose of a beta-1 selective beta blocker (metoprolol or bisoprolol) given concomitant COPD with tenuous respiratory status.  2) Patient assistance: -Pending  3) Education: -Unable to perform patient education in full due to patient's lethargy  Medication Assistance / Insurance Benefits Check: Does the patient have prescription insurance?    Please do not hesitate to reach out with questions or concerns,  Jaun Bash, PharmD, CPP, BCPS, [facility] Heart Failure Pharmacist  Phone - [phone] 03/28/2024 9:41 AM

## 2024-03-28 NOTE — Progress Notes (Signed)
 Progress Note   Patient: [place] FMW:[id number] DOB: [birth year] DOA: 03/25/2024     3 DOS: the patient was seen and examined on 03/28/2024   Brief hospital course: 68 y.o. year old male with past medical history of HTN, HLD, HFpEF, COPD presenting to the ED with worsening shortness of breath.      Pt reports he has been feeling unwell for 3-4 weeks. He has been getting more short of breath but this improves and then gets worse. Over the last few days it has been getting worse.  Patient also has worsening sputum production.     ED Course: On arrival to the ED patient was noted to be HDS stable. CBC without leukocytosis, but macrocytosis and erythrocytosis. BMP with hyponatremia, AKI and elevated bicarb. CXR without any acute findings.   10/22.  TOC consultation for noninvasive ventilation.  Will change BiPAP to nighttime.  Continue IV Lasix .  Echocardiogram shows an EF of 50%.  Replace potassium IV and orally 10/23.  Change Lasix  over to Diamox with CO2 on chemistry greater than 45.  Replace potassium orally.  Assessment and Plan: * Acute on chronic respiratory failure with hypoxia and hypercapnia (HCC) Patient states he does not wear oxygen at home.  Venous blood gas shows a pCO2 of 93.  Qualifies for noninvasive ventilation at home.  Will have TOC look into this.  ABG shows a pCO2 of 91.  Continue BiPAP at night.  Patient also qualifies for oxygen at home with pulse ox dropping into the 70s with ambulation.  Will have to set up home oxygen.  10/24.  I had difficulty waking him up this morning.  Patient had some difficulty taking pills this morning with nursing staff.  Venous blood gas shows a pCO2 of 107 with a pH of 7.31.  Placed on continuous BiPAP with higher pressures than he previously was on last night.  Will get pulmonary consultation.  Will get another VBG around 2 p.m.  Acute on chronic diastolic CHF (congestive heart failure) (HCC) With anasarca.  EF 50 to 55%.  I gave 2  doses of Diamox yesterday with CO2 on chemistry greater than 45.  Was planning on switching over to torsemide today but I will switch to IV Lasix .  Patient is down from 250 down to 232.59 pounds  COPD with acute exacerbation (HCC) Initially given Solu-Medrol  but now on prednisone .  Continue nebulizer treatments and inhalers.  Hypokalemia Oral placement and IV replacement.  Continue spironolactone and ARB.  HTN (hypertension) Continue Coreg , Cozaar and spironolactone.  Acute kidney injury superimposed on CKD Creatinine improved to 1.1 with diuresis, creatinine peak 1.37.  Likely underlying chronic kidney disease stage IIIa.  Today's creatinine 1.22 with a GFR greater than 60.  Obesity (BMI 30-39.9) BMI 34.35 with height and weight listed in computer.        Subjective: Patient seen this morning and he was difficult to wake up.  Needed a sternal rub.  After he woke up he was able to have a good conversation.  Nursing staff noted that he was having trouble taking his medications so we got another VBG which showed a pH of 7.31 and a pCO2 of 107.  Patient was placed on continuous BiPAP.  Physical Exam: Vitals:   03/28/24 0500 03/28/24 0517 03/28/24 0916 03/28/24 1143  BP:  105/61 115/75 97/60  Pulse:  94 86 72  Resp:  18 (!) 21   Temp:  (!) 97.5 F (36.4 C) 97.8 F (36.6 C)  97.8 F (36.6 C)  TempSrc:  Oral Oral   SpO2:  95% 96% 97%  Weight: 105.5 kg     Height:       Physical Exam HENT:     Head: Normocephalic.     Mouth/Throat:     Pharynx: No oropharyngeal exudate.  Eyes:     General: Lids are normal.     Conjunctiva/sclera: Conjunctivae normal.  Cardiovascular:     Rate and Rhythm: Normal rate and regular rhythm.     Heart sounds: Normal heart sounds, S1 normal and S2 normal.  Pulmonary:     Breath sounds: Examination of the right-lower field reveals decreased breath sounds and rhonchi. Examination of the left-lower field reveals decreased breath sounds and rhonchi.  Decreased breath sounds and rhonchi present. No wheezing or rales.  Abdominal:     Palpations: Abdomen is soft.     Tenderness: There is no abdominal tenderness.  Musculoskeletal:     Right lower leg: Swelling present.     Left lower leg: Swelling present.  Skin:    General: Skin is warm.     Findings: No rash.  Neurological:     Mental Status: He is alert.     Comments: Patient was hard to wake up this morning and needed a sternal rub.  Patient was able to sit up on reevaluation today and moved all of his extremities.  Able to answer questions.     Data Reviewed: Venous blood gas pH 7.31 pCO2 of 107 Sodium 133, potassium 3.2, creatinine 1.22, CO2 down to 41 on chemistry, white blood cell count 8.4, hemoglobin 17.8, platelet count 117 Family Communication: Patient refused me calling family since he has been here  Disposition: Status is: Inpatient Remains inpatient appropriate because: Will place on continuous BiPAP with pCO2 of 107.  Pulmonary consultation.  Planned Discharge Destination: Home with Home Health    Critical care time spent: 35 minutes Case discussed with respiratory, nursing staff, pharmacist and pulmonary.  Author: Charlie Patterson, MD 03/28/2024 12:46 PM  For on call review [email or web address].

## 2024-03-29 DIAGNOSIS — I509 Heart failure, unspecified: Secondary | ICD-10-CM

## 2024-03-29 DIAGNOSIS — J181 Lobar pneumonia, unspecified organism: Secondary | ICD-10-CM

## 2024-03-29 DIAGNOSIS — Z7189 Other specified counseling: Secondary | ICD-10-CM | POA: Diagnosis not present

## 2024-03-29 DIAGNOSIS — I5033 Acute on chronic diastolic (congestive) heart failure: Secondary | ICD-10-CM | POA: Diagnosis not present

## 2024-03-29 DIAGNOSIS — J9621 Acute and chronic respiratory failure with hypoxia: Secondary | ICD-10-CM | POA: Diagnosis not present

## 2024-03-29 DIAGNOSIS — Z515 Encounter for palliative care: Secondary | ICD-10-CM

## 2024-03-29 DIAGNOSIS — D696 Thrombocytopenia, unspecified: Secondary | ICD-10-CM | POA: Insufficient documentation

## 2024-03-29 DIAGNOSIS — R338 Other retention of urine: Secondary | ICD-10-CM | POA: Insufficient documentation

## 2024-03-29 DIAGNOSIS — J441 Chronic obstructive pulmonary disease with (acute) exacerbation: Secondary | ICD-10-CM | POA: Diagnosis not present

## 2024-03-29 DIAGNOSIS — D751 Secondary polycythemia: Secondary | ICD-10-CM | POA: Insufficient documentation

## 2024-03-29 DIAGNOSIS — Z66 Do not resuscitate: Secondary | ICD-10-CM | POA: Diagnosis not present

## 2024-03-29 DIAGNOSIS — E876 Hypokalemia: Secondary | ICD-10-CM | POA: Diagnosis not present

## 2024-03-29 LAB — BLOOD GAS, VENOUS
Acid-Base Excess: 19.1 mmol/L — ABNORMAL HIGH (ref 0.0–2.0)
Acid-Base Excess: 21.4 mmol/L — ABNORMAL HIGH (ref 0.0–2.0)
Acid-Base Excess: 18.4 mmol/L — ABNORMAL HIGH (ref 0.0–2.0)
Bicarbonate: 51.4 mmol/L — ABNORMAL HIGH (ref 20.0–28.0)
Bicarbonate: 52.9 mmol/L — ABNORMAL HIGH (ref 20.0–28.0)
Bicarbonate: 49.1 mmol/L — ABNORMAL HIGH (ref 20.0–28.0)
O2 Saturation: 37.7 %
O2 Saturation: 43.5 %
O2 Saturation: 73 %
Patient temperature: 37
Patient temperature: 37
Patient temperature: 37
pCO2, Ven: 107 mmHg (ref 44–60)
pCO2, Ven: 98 mmHg (ref 44–60)
pCO2, Ven: 91 mmHg (ref 44–60)
pH, Ven: 7.29 (ref 7.25–7.43)
pH, Ven: 7.34 (ref 7.25–7.43)
pH, Ven: 7.34 (ref 7.25–7.43)
pO2, Ven: 45 mmHg (ref 32–45)

## 2024-03-29 LAB — BASIC METABOLIC PANEL WITH GFR
Anion gap: 10 (ref 5–15)
BUN: 31 mg/dL — ABNORMAL HIGH (ref 8–23)
CO2: 35 mmol/L — ABNORMAL HIGH (ref 22–32)
Calcium: 9.7 mg/dL (ref 8.9–10.3)
Chloride: 86 mmol/L — ABNORMAL LOW (ref 98–111)
Creatinine, Ser: 0.88 mg/dL (ref 0.61–1.24)
GFR, Estimated: >60 mL/min (ref >60)
Glucose, Bld: 161 mg/dL — ABNORMAL HIGH (ref 70–99)
Potassium: 3.8 mmol/L (ref 3.5–5.1)
Sodium: 131 mmol/L — ABNORMAL LOW (ref 135–145)

## 2024-03-29 LAB — CBC
HCT: 54.4 % — ABNORMAL HIGH (ref 39.0–52.0)
Hemoglobin: 17.3 g/dL — ABNORMAL HIGH (ref 13.0–17.0)
MCH: 35.3 pg — ABNORMAL HIGH (ref 26.0–34.0)
MCHC: 31.8 g/dL (ref 30.0–36.0)
MCV: 111 fL — ABNORMAL HIGH (ref 80.0–100.0)
Platelets: 88 K/uL — ABNORMAL LOW (ref 150–400)
RBC: 4.9 MIL/uL (ref 4.22–5.81)
RDW: 14.4 % (ref 11.5–15.5)
WBC: 5 K/uL (ref 4.0–10.5)
nRBC: 0 % (ref 0.0–0.2)

## 2024-03-29 LAB — BLOOD GAS, ARTERIAL
Acid-Base Excess: 15.4 mmol/L — ABNORMAL HIGH (ref 0.0–2.0)
Acid-Base Excess: 18.3 mmol/L — ABNORMAL HIGH (ref 0.0–2.0)
Bicarbonate: 44.4 mmol/L — ABNORMAL HIGH (ref 20.0–28.0)
Bicarbonate: 47.9 mmol/L — ABNORMAL HIGH (ref 20.0–28.0)
Collection site: 708511
Delivery systems: POSITIVE
Delivery systems: POSITIVE
Expiratory PAP: 8 cmH2O
FIO2: 40 %
FIO2: 40 %
Inspiratory PAP: 22 cmH2O
O2 Saturation: 99.1 %
O2 Saturation: 99.2 %
Patient temperature: 37
Patient temperature: 37
pCO2 arterial: 75 mmHg (ref 32–48)
pCO2 arterial: 81 mmHg (ref 32–48)
pH, Arterial: 7.38 (ref 7.35–7.45)
pH, Arterial: 7.38 (ref 7.35–7.45)
pO2, Arterial: 91 mmHg (ref 83–108)
pO2, Arterial: 93 mmHg (ref 83–108)

## 2024-03-29 LAB — HEPATITIS C ANTIBODY: HCV Ab: NONREACTIVE

## 2024-03-29 MED ORDER — IPRATROPIUM-ALBUTEROL 0.5-2.5 (3) MG/3ML IN SOLN
3.0000 mL | Freq: Two times a day (BID) | RESPIRATORY_TRACT | Status: DC
  Administered 2024-03-29 – 2024-04-08 (×20): 3 mL via RESPIRATORY_TRACT
  Filled 2024-03-29 (×20): qty 3

## 2024-03-29 MED ORDER — FUROSEMIDE 10 MG/ML IJ SOLN
60.0000 mg | Freq: Two times a day (BID) | INTRAMUSCULAR | Status: DC
  Administered 2024-03-29 – 2024-03-31 (×6): 60 mg via INTRAVENOUS
  Filled 2024-03-29 (×6): qty 6

## 2024-03-29 MED ORDER — SODIUM CHLORIDE 0.9 % IV SOLN
2.0000 g | INTRAVENOUS | Status: AC
  Administered 2024-03-29 – 2024-04-02 (×5): 2 g via INTRAVENOUS
  Filled 2024-03-29 (×5): qty 20

## 2024-03-29 MED ORDER — TAMSULOSIN HCL 0.4 MG PO CAPS
0.4000 mg | ORAL_CAPSULE | Freq: Every day | ORAL | Status: DC
  Administered 2024-03-29 – 2024-04-08 (×11): 0.4 mg via ORAL
  Filled 2024-03-29 (×11): qty 1

## 2024-03-29 NOTE — Assessment & Plan Note (Signed)
 Likely secondary to chronic hypoxia.  Will send of JAK2 and erythropoietin level.

## 2024-03-29 NOTE — Plan of Care (Signed)

## 2024-03-29 NOTE — Consult Note (Signed)
 Consultation Note Date: 03/29/2024 at 0958  Patient Name: Miguel [place]  DOB: 24-May-1956  MRN: [id number]  Age / Sex: 68 y.o., male  PCP: Miguel Reyes BIRCH, MD Referring Physician: Josette Ade, MD  HPI/Patient Profile: 68 y.o. male  with past medical history significant for HTN, HLD, HFpEF (50-55%, 03/26/2024) and COPD. Patient presented to ED 03/25/2024  from PCP after findings of hypoxia, c/o worsening SOB. Pt reported in the ED he has not been feeling well for 3-4 weeks with progressive SOB and increase in sputum production.   ED labs showed Na+ 132, chloride 69, Co2 42, BUN 36, creatinine 1.31, anion gap 21. HGB 18.7, BNP 1280.7.  EKG SR, HR 75 CXR without evidence of acute cardiopulmonary pathology.  ED vitals 165/87, HR 78, RR 16, SpO2 100% 2L, 97.7 F  TR[facility] was consulted for admission and management of acute respiratory failure with hypoxia and hypercapnia, acute on chronic diastolic CHF, hypokalemia and CKF stage IIIa.   Palliative medicine was consulted for assistance with goals of care conversations.   Clinical Assessment and Goals of Care: Extensive chart review completed prior to meeting patient including labs, vital signs, imaging, progress notes, orders, and available advanced directive documents from current and previous encounters. I then met with patient and sisters at bedside to discuss diagnosis prognosis, GOC, EOL wishes, disposition and options.     Latest Ref Rng & Units 03/29/2024    8:29 AM 03/28/2024    5:45 AM 03/26/2024    3:31 AM  CBC  WBC 4.0 - 10.5 K/uL 5.0  8.4  4.1   Hemoglobin 13.0 - 17.0 g/dL 82.6  82.1  81.9   Hematocrit 39.0 - 52.0 % 54.4  58.0  55.5   Platelets 150 - 400 K/uL 88  117  126       Latest Ref Rng & Units 03/29/2024    5:37 AM 03/28/2024    5:45 AM 03/27/2024    7:48 AM  CMP  Glucose 70 - 99 mg/dL 838  832  95   BUN 8 - 23 mg/dL 31  31  28     Creatinine 0.61 - 1.24 mg/dL 9.11  8.77  8.89   Sodium 135 - 145 mmol/L 131  133  135   Potassium 3.5 - 5.1 mmol/L 3.8  3.2  3.0   Chloride 98 - 111 mmol/L 86  80  77   CO2 22 - 32 mmol/L 35  41  >45   Calcium  8.9 - 10.3 mg/dL 9.7  9.7  9.5     I introduced Palliative Medicine as specialized medical care for people living with serious illness. It focuses on providing relief from the symptoms and stress of a serious illness. The goal is to improve quality of life for both the patient and the family.  Ill-appearing male lying in bed with CPAP in place. He is alert and able to answer yes or no questions. He can mouth words or hold up fingers to identify responses. He is alert to self,  time, location and situation. He is able to participate in conversation, making his wishes known. Increased WOB with CPAP, no respiratory distress. [place] and [place], sisters, at bedside.   We discussed a brief life review of the patient. Miguel [place] is not married and does not have children. He works part-time as a statistician. He has 3 sisters, [place], [place] and [place]. [place] and [place] are not local [place]   As far as functional and nutritional status, [place] lives alone and is independent with his ADL's. He is able to cook, clean and drive as he is still employed part-time. He endorses normal appetite until he has shortness of breath that makes is difficult to eat.   We discussed patient's current illness and what it means in the larger context of patient's on-going co-morbidities.  Natural disease trajectory and expectations at EOL were discussed.  I attempted to elicit values and goals of care important to the patient. Miguel [place] wants to be able to go home. [place] shares that plan is for patient to go home with her when he is discharged home.   The difference between aggressive medical intervention and comfort care was considered in light of the patient's goals of care.    Advance directives, concepts specific to code status, artificial feeding and hydration, and rehospitalization were considered and discussed. We discussed CPR and intervention such as intubation for respiratory support. [place]ell shares that [place] had conveyed to her that he did not want CPR but was okay with having ventilator short-term if needed. I confirmed with [place] that he does not want CPR and ventilator short-term if needed. [place] shakes head yes that these are his wishes.  They're not sure about feeding tube or trach and state they will talk about that if it is needed.   DNR signed and placed in physical chart.   Education offered regarding concept specific to human mortality and the limitations of medical interventions to prolong life when the body begins to fail to thrive. [place] shares that she has seen a decline over the past few weeks with sleeping more.   Family is facing treatment option decisions, advanced directive, and anticipatory care needs. [place] confirms if he were to become confused and unable to make decisions for himself, he appoints [place] and [place] to be his surrogate decision makers. Both sisters are in agreement with their brothers wishes.  Discussed with patient/family the importance of continued conversation with family and the medical providers regarding overall plan of care and treatment options, ensuring decisions are within the context of the patient's values and GOCs.    Hospice and Palliative Care services outpatient were explained and offered. [place] and [place] are going to contact palliative agency [place] for assistance with care.   Questions and concerns were addressed. The family was encouraged to call with questions or concerns.   Primary Decision Maker PATIENT  Physical Exam Vitals reviewed.  Constitutional:      General: He is not in acute distress.    Appearance: He is obese. He is ill-appearing.  HENT:      Head: Normocephalic and atraumatic.     Mouth/Throat:     Mouth: Mucous membranes are dry.  Pulmonary:     Effort: No respiratory distress.     Comments: Increased WOB with CPAP Musculoskeletal:     Right lower leg: Edema present.     Left lower leg: Edema present.  Skin:    General: Skin is warm and dry.  Neurological:     Mental Status: He is alert and oriented to person, place, and time.  Psychiatric:        Mood and Affect: Mood normal.        Behavior: Behavior normal.        Thought Content: Thought content normal.        Judgment: Judgment normal.     Recommendations/Plan: DNR/DNI- Uploaded to Epic via Vynca, signed DNR placed in paper chart Continue current supportive interventions Plan to d/c home with home health and oxygen  Palliative will continue to follow   Palliative Assessment/Data: 50-60%     Thank you for this consult. Palliative medicine will continue to follow and assist holistically.   Time Total: 90 minutes  Time spent includes: Detailed review of medical records (labs, imaging, vital signs), medically appropriate exam (mental status, respiratory, cardiac, skin), discussed with treatment team, counseling and educating patient, family and staff, documenting clinical information, medication management and coordination of care.     Devere Sacks, AMANDA [facility] Palliative Medicine Team  03/29/2024 9:59 AM  Office [phone]  Pager [phone]     Please contact Palliative Medicine Team providers via AMION for questions and concerns.

## 2024-03-29 NOTE — Assessment & Plan Note (Signed)
 Patient completed Zithromax  will give Rocephin .

## 2024-03-29 NOTE — Progress Notes (Signed)
 Progress Note   Patient: [place] FMW:[id number] DOB: Dec 21, 1955 DOA: 03/25/2024     4 DOS: the patient was seen and examined on 03/29/2024   Brief hospital course: 68 y.o. year old male with past medical history of HTN, HLD, HFpEF, COPD presenting to the ED with worsening shortness of breath.      Pt reports he has been feeling unwell for 3-4 weeks. He has been getting more short of breath but this improves and then gets worse. Over the last few days it has been getting worse.  Patient also has worsening sputum production.     ED Course: On arrival to the ED patient was noted to be HDS stable. CBC without leukocytosis, but macrocytosis and erythrocytosis. BMP with hyponatremia, AKI and elevated bicarb. CXR without any acute findings.   10/22.  TOC consultation for noninvasive ventilation.  Will change BiPAP to nighttime.  Continue IV Lasix .  Echocardiogram shows an EF of 50%.  Replace potassium IV and orally 10/23.  Change Lasix  over to Diamox with CO2 on chemistry greater than 45.  Replace potassium orally.  Assessment and Plan: * Acute on chronic respiratory failure with hypoxia and hypercapnia (HCC) Patient states he does not wear oxygen at home.  Venous blood gas shows a pCO2 of 93.  Qualifies for noninvasive ventilation at home.  Will have TOC look into this.  ABG shows a pCO2 of 91.  Continue BiPAP at night.  Patient also qualifies for oxygen at home with pulse ox dropping into the 70s with ambulation.  Will have to set up home oxygen.  10/24.  I had difficulty waking him up this morning.  Patient had some difficulty taking pills this morning with nursing staff.  Venous blood gas shows a pCO2 of 107 with a pH of 7.31.  Placed on continuous BiPAP with higher pressures than he previously was on last night.  Appreciate pulmonary consultation.  10/24.  Made limited code with palliative care.  Patient was able to come off BiPAP to eat breakfast but then went back on the BiPAP.  Last  pCO2 91.  Acute on chronic diastolic CHF (congestive heart failure) (HCC) With anasarca.  EF 50 to 55%.  Will continue IV Lasix  60 mg IV twice daily.  Continue spironolactone and Cozaar and low-dose Coreg   COPD exacerbation (HCC) Pulmonary changed to high-dose Solu-Medrol .  Continue nebulizers.  Lobar pneumonia Patient completed Zithromax  will give Rocephin .  Hypokalemia Potassium in normal range.  Continue spironolactone and ARB.  HTN (hypertension) Continue Coreg , Cozaar and spironolactone.  Acute kidney injury superimposed on CKD Creatinine improved to 1.1 with diuresis, creatinine peak 1.37.  Likely underlying chronic kidney disease stage IIIa.  Today's creatinine down to 0.88.  Continue to monitor with diuresis  Obesity (BMI 30-39.9) BMI 34.93 with height and weight listed in computer.  Acute urinary retention Start Flomax.  In-N-Out catheterizations if needed  Thrombocytopenia Continue to monitor.  Hepatitis C negative  Erythrocytosis Likely secondary to chronic hypoxia.  Will send of JAK2 and erythropoietin level.        Subjective: Patient woke up and was able to talk to me while on the BiPAP.  At the time I spoke with him he wanted to be a full code but is now limited COVID after palliative care came by and talk with him and family.  Patient was able to come off BiPAP to eat breakfast but then went back on the BiPAP.  Physical Exam: Vitals:   03/29/24 0818 03/29/24  9142 03/29/24 0858 03/29/24 1305  BP:  115/65  (!) 116/57  Pulse:  75 74 78  Resp:      Temp:  97.7 F (36.5 C)  (!) 97.2 F (36.2 C)  TempSrc:      SpO2: 99% (!) 89% 91% 100%  Weight:      Height:       Physical Exam HENT:     Head: Normocephalic.     Mouth/Throat:     Pharynx: No oropharyngeal exudate.  Eyes:     General: Lids are normal.     Conjunctiva/sclera: Conjunctivae normal.  Cardiovascular:     Rate and Rhythm: Normal rate and regular rhythm.     Heart sounds: Normal heart  sounds, S1 normal and S2 normal.  Pulmonary:     Breath sounds: Examination of the right-lower field reveals decreased breath sounds and rhonchi. Examination of the left-lower field reveals decreased breath sounds and rhonchi. Decreased breath sounds and rhonchi present. No wheezing or rales.  Abdominal:     Palpations: Abdomen is soft.     Tenderness: There is no abdominal tenderness.  Musculoskeletal:     Right lower leg: Swelling present.     Left lower leg: Swelling present.  Skin:    General: Skin is warm.     Findings: No rash.  Neurological:     Mental Status: He is alert.     Comments: Patient was able to talk with me this morning on the BiPAP and answered questions.     Data Reviewed: Last blood gas shows a pH of 7.34 pCO2 of 91 Sodium 131, potassium 3.8, chloride 86, CO2 35, BUN 31, creatinine 0.88, white blood cell count 5.0, hemoglobin 17.3, platelet count 88  Family Communication: Spoke with sisters at the bedside with permission of patient.  Disposition: Status is: Inpatient Remains inpatient appropriate because: Appreciate palliative care consultation today.  Patient will have to be stable on BiPAP at night and not requiring it continuously during the day.  Planned Discharge Destination: Home with Home Health    Time spent: 28 minutes  Author: Charlie Patterson, MD 03/29/2024 1:14 PM  For on call review [email or web address].

## 2024-03-29 NOTE — Assessment & Plan Note (Addendum)
 Continue to monitor.  Hepatitis C negative.  Platelet count 75.  Will hold off on Lovenox  injections.

## 2024-03-29 NOTE — Assessment & Plan Note (Signed)
 Start Flomax.  In-N-Out catheterizations if needed

## 2024-03-30 DIAGNOSIS — J9621 Acute and chronic respiratory failure with hypoxia: Secondary | ICD-10-CM | POA: Diagnosis not present

## 2024-03-30 DIAGNOSIS — I5033 Acute on chronic diastolic (congestive) heart failure: Secondary | ICD-10-CM | POA: Diagnosis not present

## 2024-03-30 DIAGNOSIS — J181 Lobar pneumonia, unspecified organism: Secondary | ICD-10-CM | POA: Diagnosis not present

## 2024-03-30 DIAGNOSIS — Z66 Do not resuscitate: Secondary | ICD-10-CM | POA: Diagnosis not present

## 2024-03-30 DIAGNOSIS — Z515 Encounter for palliative care: Secondary | ICD-10-CM | POA: Diagnosis not present

## 2024-03-30 DIAGNOSIS — J441 Chronic obstructive pulmonary disease with (acute) exacerbation: Secondary | ICD-10-CM | POA: Diagnosis not present

## 2024-03-30 DIAGNOSIS — D751 Secondary polycythemia: Secondary | ICD-10-CM | POA: Diagnosis not present

## 2024-03-30 LAB — CBC
HCT: 54.8 % — ABNORMAL HIGH (ref 39.0–52.0)
Hemoglobin: 17.5 g/dL — ABNORMAL HIGH (ref 13.0–17.0)
MCH: 35.2 pg — ABNORMAL HIGH (ref 26.0–34.0)
MCHC: 31.9 g/dL (ref 30.0–36.0)
MCV: 110.3 fL — ABNORMAL HIGH (ref 80.0–100.0)
Platelets: 95 K/uL — ABNORMAL LOW (ref 150–400)
RBC: 4.97 MIL/uL (ref 4.22–5.81)
RDW: 14.3 % (ref 11.5–15.5)
WBC: 5.5 K/uL (ref 4.0–10.5)
nRBC: 0 % (ref 0.0–0.2)

## 2024-03-30 LAB — BASIC METABOLIC PANEL WITH GFR
Anion gap: 10 (ref 5–15)
BUN: 32 mg/dL — ABNORMAL HIGH (ref 8–23)
CO2: 36 mmol/L — ABNORMAL HIGH (ref 22–32)
Calcium: 9.6 mg/dL (ref 8.9–10.3)
Chloride: 90 mmol/L — ABNORMAL LOW (ref 98–111)
Creatinine, Ser: 0.91 mg/dL (ref 0.61–1.24)
GFR, Estimated: >60 mL/min (ref >60)
Glucose, Bld: 154 mg/dL — ABNORMAL HIGH (ref 70–99)
Potassium: 4.3 mmol/L (ref 3.5–5.1)
Sodium: 136 mmol/L (ref 135–145)

## 2024-03-30 LAB — CULTURE, RESPIRATORY W GRAM STAIN: Special Requests: NORMAL

## 2024-03-30 MED ORDER — FOLIC ACID 1 MG PO TABS
1.0000 mg | ORAL_TABLET | Freq: Every day | ORAL | Status: DC
  Administered 2024-03-30 – 2024-04-08 (×10): 1 mg via ORAL
  Filled 2024-03-30 (×10): qty 1

## 2024-03-30 MED ORDER — POTASSIUM CHLORIDE CRYS ER 20 MEQ PO TBCR
20.0000 meq | EXTENDED_RELEASE_TABLET | Freq: Two times a day (BID) | ORAL | Status: DC
  Administered 2024-03-30 – 2024-03-31 (×2): 20 meq via ORAL
  Filled 2024-03-30 (×2): qty 1

## 2024-03-30 NOTE — Plan of Care (Signed)
  Problem: Clinical Measurements: Goal: Ability to maintain clinical measurements within normal limits will improve Outcome: Progressing   Problem: Coping: Goal: Level of anxiety will decrease Outcome: Progressing   Problem: Pain Managment: Goal: General experience of comfort will improve and/or be controlled Outcome: Progressing   Problem: Safety: Goal: Ability to remain free from injury will improve Outcome: Progressing   Problem: Elimination: Goal: Will not experience complications related to urinary retention Outcome: Progressing

## 2024-03-30 NOTE — Progress Notes (Signed)
 Palliative Care Progress Note, Assessment & Plan   Patient Name: Miguel Howell       Date: 03/30/2024 DOB: 1956-03-17  Age: 68 y.o. MRN#: [id number] Attending Physician: Josette Ade, MD Primary Care Physician: Auston Reyes BIRCH, MD Admit Date: 03/25/2024  Subjective: Feels much better today. Ate some breakfast. Complains of sore throat and cough with greenish/brown productive sputum. Denies CP/SOB. Amenable to STR at discharge at facility close to his sister's home in [place], [place].   HPI: 68 y.o. male  with past medical history significant for HTN, HLD, HFpEF (50-55%, 03/26/2024) and COPD. Patient presented to ED 03/25/2024  from PCP after findings of hypoxia, c/o worsening SOB. Pt reported in the ED he has not been feeling well for 3-4 weeks with progressive SOB and increase in sputum production.    ED labs showed Na+ 132, chloride 69, Co2 42, BUN 36, creatinine 1.31, anion gap 21. HGB 18.7, BNP 1280.7.  EKG SR, HR 75 CXR without evidence of acute cardiopulmonary pathology.  ED vitals 165/87, HR 78, RR 16, SpO2 100% 2L, 97.7 F   TRH was consulted for admission and management of acute respiratory failure with hypoxia and hypercapnia, acute on chronic diastolic CHF, hypokalemia and CKF stage IIIa.    Palliative medicine was consulted for assistance with goals of care conversations.     Summary of counseling/coordination of care: Extensive chart review completed prior to meeting patient including labs, vital signs, imaging, progress notes, orders, and available advanced directive documents from current and previous encounters.      Latest Ref Rng & Units 03/30/2024    4:49 AM 03/29/2024    8:29 AM 03/28/2024    5:45 AM  CBC  WBC 4.0 - 10.5 K/uL 5.5  5.0  8.4   Hemoglobin 13.0 - 17.0 g/dL  82.4  82.6  82.1   Hematocrit 39.0 - 52.0 % 54.8  54.4  58.0   Platelets 150 - 400 K/uL 95  88  117       Latest Ref Rng & Units 03/30/2024    4:49 AM 03/29/2024    5:37 AM 03/28/2024    5:45 AM  CMP  Glucose 70 - 99 mg/dL 845  838  832   BUN 8 - 23 mg/dL 32  31  31   Creatinine 0.61 - 1.24 mg/dL 9.08  9.11  8.77   Sodium 135 - 145 mmol/L 136  131  133   Potassium 3.5 - 5.1 mmol/L 4.3  3.8  3.2   Chloride 98 - 111 mmol/L 90  86  80   CO2 22 - 32 mmol/L 36  35  41   Calcium  8.9 - 10.3 mg/dL 9.6  9.7  9.7     After reviewing the patient's chart and assessing the patient at bedside, I spoke with patient and sister in regards to symptom management and goals of care.   Vary pleasant male resting in bed with sister at bedside. Patient observed to be sitting on EOB after using urinal. He is alert and oriented to self, time, location and situation. He is able to participate in conversation making his wishes known. No Bipap today, only using O2 via [place]. Respirations  are even and unlabored. He is in no distress. PT to re-eval for STR.   Discussed with Diane (sister) at bedside possibility of short term rehab once discharged. Diane shares that she spoke with Dr. Josette this morning and patient will be evaluated for STR. Diane requests patient go to [facility] in [place], [place] near her home. Advised that case management would be notified for assistance with request. TOC consult placed.   Diane shares that patient was supposed to complete paperwork for her and other sister Ronal, to be HPOA for their brother. Advised that order would be placed for spiritual support to assist with this request before discharge.  Spiritual consult placed.   Therapeutic silence and active listening provided for patient and sister to share their thoughts and emotions regarding current medical situation.  Emotional support provided.  Physical Exam Vitals reviewed.  Constitutional:      General: He is not  in acute distress.    Appearance: He is obese. He is not ill-appearing.  HENT:     Head: Normocephalic and atraumatic.     Mouth/Throat:     Mouth: Mucous membranes are moist.  Pulmonary:     Effort: Pulmonary effort is normal. No respiratory distress.     Comments: O2 via [place] Musculoskeletal:     Right lower leg: No edema.     Left lower leg: No edema.  Skin:    General: Skin is warm and dry.  Neurological:     Mental Status: He is alert and oriented to person, place, and time.     Motor: Weakness present.  Psychiatric:        Mood and Affect: Mood normal.        Behavior: Behavior normal.        Thought Content: Thought content normal.        Judgment: Judgment normal.     Recommendations/Plan: DNR with interventions Continue current supportive interventions Follow TOC for plan at discharge Palliative will continue to follow for needs      Total Time 50 minutes   Discussed plan of care with TOC.   Time spent includes: Detailed review of medical records (labs, imaging, vital signs), medically appropriate exam (mental status, respiratory, cardiac, skin), discussed with treatment team, counseling and educating patient, family and staff, documenting clinical information, medication management and coordination of care.     Devere Sacks, [facility]- [facility] Palliative Medicine Team  03/30/2024 8:26 AM  Office [phone]  Pager [phone]

## 2024-03-30 NOTE — Evaluation (Signed)
 Occupational Therapy Evaluation Patient Details Name: Miguel Howell MRN: [id number] DOB: August 11, 1955 Today's Date: 03/30/2024   History of Present Illness   Patient is a 68 year old male with worsening shortness of breath. PMH:  HTN, HLD, HFpEF, COPD     Clinical Impressions Pt was seen for OT evaluation this date. PTA, pt was residing at home alone where he reports he was IND, but just getting by. He was working part time.  Pt presents with deficits in strength, balance, and activity tolerance, affecting safe and optimal ADL completion. Pt currently requires supervision and use of bed features to reach EOB. Min/Mod A for safety with STS from EOB with cues for pushing up, required HHA x1 and SPC use for ambulation to the bathroom. Min A for STS from toilet, CGA for seated peri-care via lateral lean after BM and clothing management. Provided RW for ambulation to recliner ~10 ft with CGA, improved stability. Sp02 monitored throughout session with desat to 86% on 2L, however quick improvement to >90% with PLB and rest. Edu pt and sister on ECS, PLB, pacing, DME/AE/AD use to prevent overexertion and maximize safe DC home with sister. Sister feels she can provide the level of assist pt needs at this time.  Pt would benefit from skilled OT services to address noted impairments and functional limitations to maximize safety and independence while minimizing future risk of falls, injury, and readmission. Do anticipate the need for follow up OT services upon acute hospital DC.      If plan is discharge home, recommend the following:   A little help with walking and/or transfers;A little help with bathing/dressing/bathroom;Help with stairs or ramp for entrance;Assist for transportation;Assistance with cooking/housework     Functional Status Assessment   Patient has had a recent decline in their functional status and demonstrates the ability to make significant improvements in function in a  reasonable and predictable amount of time.     Equipment Recommendations   Other (comment);None recommended by OT (has needed DME)     Recommendations for Other Services         Precautions/Restrictions   Precautions Precautions: Fall Recall of Precautions/Restrictions: Intact Restrictions Weight Bearing Restrictions Per Provider Order: No     Mobility Bed Mobility Overal bed mobility: Needs Assistance Bed Mobility: Supine to Sit     Supine to sit: Supervision, Used rails, HOB elevated          Transfers Overall transfer level: Needs assistance Equipment used: None Transfers: Sit to/from Stand Sit to Stand: Min assist, Mod assist           General transfer comment: min/mod A to stand from lowest bed height with multiple attempts required after being in bed extended period, cues for hand/feet placement; ambulated to the bathroom and additional stand from toilet using grab bar with Min A, ambulated around the bed to recliner      Balance Overall balance assessment: Needs assistance Sitting-balance support: Feet supported Sitting balance-Leahy Scale: Good     Standing balance support: Bilateral upper extremity supported Standing balance-Leahy Scale: Fair Standing balance comment: ambulated with SPC and HHA x1 to bathroom, provided RW with improvement to CGA                           ADL either performed or assessed with clinical judgement   ADL Overall ADL's : Needs assistance/impaired     Grooming: Wash/dry hands;Standing;Contact guard assist;Supervision/safety  Toilet Transfer: Minimal assistance;Regular Toilet;Grab bars;Rolling walker (2 wheels)   Toileting- Clothing Manipulation and Hygiene: Supervision/safety;Sit to/from stand Toileting - Clothing Manipulation Details (indicate cue type and reason): seated lateral lean after cont BM and urine on toilet     Functional mobility during ADLs: Contact guard  assist;Rolling walker (2 wheels)       Vision         Perception         Praxis         Pertinent Vitals/Pain Pain Assessment Pain Assessment: No/denies pain     Extremity/Trunk Assessment Upper Extremity Assessment Upper Extremity Assessment: Overall WFL for tasks assessed   Lower Extremity Assessment Lower Extremity Assessment: Generalized weakness       Communication Communication Communication: No apparent difficulties Factors Affecting Communication: Reduced clarity of speech (low tone of voice (whispers most of the time))   Cognition Arousal: Alert Behavior During Therapy: Flat affect                                 Following commands: Impaired Following commands impaired: Follows one step commands with increased time     Cueing  General Comments   Cueing Techniques: Verbal cues  on 1L 02 on entry, placed on 2L throughout session with sp02 desat to 86% with activity and improvement to 94% at rest   Exercises Other Exercises Other Exercises: Edu on role of OT in acute setting, DME/AE/AD, task modification, ECS and PLB techniques to prevent overexertion.   Shoulder Instructions      Home Living Family/patient expects to be discharged to:: Private residence Living Arrangements: Alone   Type of Home: House Home Access: Stairs to enter Secretary/administrator of Steps: 5 Entrance Stairs-Rails: None Home Layout: One level               Home Equipment: Cane - single point   Additional Comments: wishes to return home with his sister-sister has a RW and BSC      Prior Functioning/Environment Prior Level of Function : Independent/Modified Independent;Working/employed;Driving             Mobility Comments: PRN use of cane. works part time ADLs Comments: IND    OT Problem List: Decreased strength;Decreased activity tolerance;Impaired balance (sitting and/or standing)   OT Treatment/Interventions: Self-care/ADL  training;Therapeutic exercise;Therapeutic activities;Energy conservation;DME and/or AE instruction;Patient/family education;Balance training      OT Goals(Current goals can be found in the care plan section)   Acute Rehab OT Goals Patient Stated Goal: improve strength OT Goal Formulation: With patient Time For Goal Achievement: 04/13/24 Potential to Achieve Goals: Good ADL Goals Pt Will Perform Lower Body Dressing: with supervision;sitting/lateral leans;sit to/from stand Pt Will Transfer to Toilet: with supervision;ambulating Additional ADL Goal #1: Pt will demo implementation of 1 learned ECS during ADL performance 2/2 trials to maximize IND/safety and prevent overexertion.   OT Frequency:  Min 2X/week    Co-evaluation              AM-PAC OT 6 Clicks Daily Activity     Outcome Measure Help from another person eating meals?: A Little Help from another person taking care of personal grooming?: A Little Help from another person toileting, which includes using toliet, bedpan, or urinal?: A Little Help from another person bathing (including washing, rinsing, drying)?: A Lot Help from another person to put on and taking off regular upper body clothing?: A Little Help from another  person to put on and taking off regular lower body clothing?: A Lot 6 Click Score: 16   End of Session Equipment Utilized During Treatment: Gait belt;Rolling walker (2 wheels);Oxygen Nurse Communication: Mobility status  Activity Tolerance: Patient tolerated treatment well Patient left: with call bell/phone within reach;in chair;with chair alarm set;with family/visitor present  OT Visit Diagnosis: Other abnormalities of gait and mobility (R26.89);Muscle weakness (generalized) (M62.81)                Time: 8949-8875 OT Time Calculation (min): 34 min Charges:  OT General Charges $OT Visit: 1 Visit OT Evaluation $OT Eval Low Complexity: 1 Low OT Treatments $Self Care/Home Management : 8-22  mins Agam Davenport, OTR/L 03/30/24, 2:59 PM  Acelynn Dejonge E Desarai Barrack 03/30/2024, 2:54 PM

## 2024-03-30 NOTE — Progress Notes (Addendum)
 Progress Note   Patient: Miguel Howell FMW:[id number] DOB: 07/22/1955 DOA: 03/25/2024     5 DOS: the patient was seen and examined on 03/30/2024   Brief hospital course: 68 y.o. year old male with past medical history of HTN, HLD, HFpEF, COPD presenting to the ED with worsening shortness of breath.      Pt reports he has been feeling unwell for 3-4 weeks. He has been getting more short of breath but this improves and then gets worse. Over the last few days it has been getting worse.  Patient also has worsening sputum production.     ED Course: On arrival to the ED patient was noted to be HDS stable. CBC without leukocytosis, but macrocytosis and erythrocytosis. BMP with hyponatremia, AKI and elevated bicarb. CXR without any acute findings.   10/22.  TOC consultation for noninvasive ventilation.  Will change BiPAP to nighttime.  Continue IV Lasix .  Echocardiogram shows an EF of 50%.  Replace potassium IV and orally 10/23.  Change Lasix  over to Diamox with CO2 on chemistry greater than 45.  Replace potassium orally. 10/24.  Patient with worsening mental status and pCO2 elevated at 107.  Placed back on continuous BiPAP.  Restarted Lasix .  Pulmonary consulted and started high-dose Solu-Medrol . 10/25.  Palliative care consulted and patient made a DNR with intervention (no CPR or chest compressions but may intubate) 10/26.  Patient wanting to come off the BiPAP so he can eat.  Patient states that he has not worked with physical therapy.  Will reconsult physical therapy. 10/27.  Patient placed on oxygen and taken off the BiPAP this morning.  Will see how he does  Assessment and Plan: * Acute on chronic respiratory failure with hypoxia and hypercapnia (HCC) Patient states he does not wear oxygen at home.  Venous blood gas shows a pCO2 of 93.  Qualifies for noninvasive ventilation at home.  Will have TOC look into this.  ABG shows a pCO2 of 91.  Continue BiPAP at night.  Patient also qualifies for  oxygen at home with pulse ox dropping into the 70s with ambulation.  Will have to set up home oxygen.  10/24.  I had difficulty waking him up this morning.  Patient had some difficulty taking pills this morning with nursing staff.  Venous blood gas shows a pCO2 of 107 with a pH of 7.31.  Placed on continuous BiPAP with higher pressures than he previously was on last night.  Appreciate pulmonary consultation.  10/24.  Made limited code with palliative care.  Patient was able to come off BiPAP to eat breakfast but then went back on the BiPAP.  Last pCO2 91. 10/25.  Patient feeling better and wanted to come off the BiPAP to eat.  Acute on chronic diastolic CHF (congestive heart failure) (HCC) With anasarca.  EF 50 to 55%.  Will continue IV Lasix  60 mg IV twice daily.  Continue spironolactone and Cozaar and low-dose Coreg   COPD exacerbation (HCC) Pulmonary changed to high-dose Solu-Medrol .  Continue nebulizers.  Lobar pneumonia Patient completed Zithromax  will continue Rocephin .  Hypokalemia Potassium in normal range.  Continue spironolactone and ARB.  HTN (hypertension) Continue Coreg , Cozaar and spironolactone.  Acute kidney injury superimposed on CKD Creatinine improved to 1.1 with diuresis, creatinine peak 1.37.  Likely underlying chronic kidney disease stage IIIa.  Lowest creatinine 0.88.  Today's creatinine 0.91.  Continue to monitor with diuresis  Obesity (BMI 30-39.9) BMI 33.92 with height and weight listed in computer.  Acute urinary  retention Continue Flomax.  In-N-Out catheterizations if needed  Thrombocytopenia Continue to monitor.  Hepatitis C negative  Erythrocytosis Likely secondary to chronic hypoxia.  Pending JAK2 and erythropoietin level.        Subjective: Patient felt weak with getting up to be weighed.  States is the first time he has been up.  I looked back he did walk with physical therapy the other day.  Patient was on BiPAP and was getting ready to eat  breakfast and we took him off BiPAP and seems to be breathing okay and mental status around.  Admitted with CHF exacerbation, COPD exacerbation and acute on chronic respiratory failure with hypoxia and hypercapnia.  Physical Exam: Vitals:   03/30/24 0500 03/30/24 0730 03/30/24 0923 03/30/24 1131  BP:   (!) 143/76 (!) 103/51  Pulse:   84 84  Resp:  17 18   Temp:   97.6 F (36.4 C) 97.9 F (36.6 C)  TempSrc:    Oral  SpO2:  95% 93% 96%  Weight: 104.2 kg     Height:       Physical Exam HENT:     Head: Normocephalic.     Mouth/Throat:     Pharynx: No oropharyngeal exudate.  Eyes:     General: Lids are normal.     Conjunctiva/sclera: Conjunctivae normal.  Cardiovascular:     Rate and Rhythm: Normal rate and regular rhythm.     Heart sounds: Normal heart sounds, S1 normal and S2 normal.  Pulmonary:     Breath sounds: Examination of the right-lower field reveals decreased breath sounds and rhonchi. Examination of the left-lower field reveals decreased breath sounds and rhonchi. Decreased breath sounds and rhonchi present. No wheezing or rales.  Abdominal:     Palpations: Abdomen is soft.     Tenderness: There is no abdominal tenderness.  Musculoskeletal:     Right lower leg: Swelling present.     Left lower leg: Swelling present.     Comments: Lower extremity swelling improved  Skin:    General: Skin is warm.     Findings: No rash.     Comments: Bruising lower abdomen  Neurological:     Mental Status: He is alert.     Comments: Patient able to move all of his extremities.     Data Reviewed: Creatinine 0.91, CO2 36, sodium 136, white blood cell count 5.5, hemoglobin 17.5, platelet 95  Family Communication: Spoke with sister at the bedside  Disposition: Status is: Inpatient Remains inpatient appropriate because: Needs to have a good day off the BiPAP during the day.  Continue IV Lasix  and Solu-Medrol .  Planned Discharge Destination: Home with home health.  Patient's sister  may take him home with her    Time spent: 28 minutes  Author: Charlie Patterson, MD 03/30/2024 12:20 PM  For on call review [email or web address].

## 2024-03-31 DIAGNOSIS — J181 Lobar pneumonia, unspecified organism: Secondary | ICD-10-CM | POA: Diagnosis not present

## 2024-03-31 DIAGNOSIS — Z66 Do not resuscitate: Secondary | ICD-10-CM | POA: Diagnosis not present

## 2024-03-31 DIAGNOSIS — I5033 Acute on chronic diastolic (congestive) heart failure: Secondary | ICD-10-CM | POA: Diagnosis not present

## 2024-03-31 DIAGNOSIS — J9621 Acute and chronic respiratory failure with hypoxia: Secondary | ICD-10-CM | POA: Diagnosis not present

## 2024-03-31 DIAGNOSIS — J441 Chronic obstructive pulmonary disease with (acute) exacerbation: Secondary | ICD-10-CM | POA: Diagnosis not present

## 2024-03-31 DIAGNOSIS — R0902 Hypoxemia: Secondary | ICD-10-CM

## 2024-03-31 DIAGNOSIS — Z7189 Other specified counseling: Secondary | ICD-10-CM | POA: Diagnosis not present

## 2024-03-31 DIAGNOSIS — Z515 Encounter for palliative care: Secondary | ICD-10-CM | POA: Diagnosis not present

## 2024-03-31 LAB — BLOOD GAS, VENOUS
Acid-Base Excess: 14.9 mmol/L — ABNORMAL HIGH (ref 0.0–2.0)
Bicarbonate: 44.8 mmol/L — ABNORMAL HIGH (ref 20.0–28.0)
Delivery systems: POSITIVE
FIO2: 40 %
O2 Saturation: 92.2 %
Patient temperature: 37
pCO2, Ven: 83 mmHg (ref 44–60)
pH, Ven: 7.34 (ref 7.25–7.43)
pO2, Ven: 62 mmHg — ABNORMAL HIGH (ref 32–45)

## 2024-03-31 LAB — BASIC METABOLIC PANEL WITH GFR
Anion gap: 10 (ref 5–15)
BUN: 38 mg/dL — ABNORMAL HIGH (ref 8–23)
CO2: 36 mmol/L — ABNORMAL HIGH (ref 22–32)
Calcium: 9.6 mg/dL (ref 8.9–10.3)
Chloride: 89 mmol/L — ABNORMAL LOW (ref 98–111)
Creatinine, Ser: 0.86 mg/dL (ref 0.61–1.24)
GFR, Estimated: >60 mL/min (ref >60)
Glucose, Bld: 213 mg/dL — ABNORMAL HIGH (ref 70–99)
Potassium: 4.6 mmol/L (ref 3.5–5.1)
Sodium: 135 mmol/L (ref 135–145)

## 2024-03-31 LAB — ERYTHROPOIETIN: Erythropoietin: 2.2 m[IU]/mL — ABNORMAL LOW (ref 2.6–18.5)

## 2024-03-31 MED ORDER — PANTOPRAZOLE SODIUM 40 MG PO TBEC
40.0000 mg | DELAYED_RELEASE_TABLET | Freq: Every day | ORAL | Status: DC
  Administered 2024-03-31 – 2024-04-08 (×9): 40 mg via ORAL
  Filled 2024-03-31 (×9): qty 1

## 2024-03-31 MED ORDER — FLUCONAZOLE 100 MG PO TABS
100.0000 mg | ORAL_TABLET | Freq: Every day | ORAL | Status: DC
  Administered 2024-03-31 – 2024-04-06 (×7): 100 mg via ORAL
  Filled 2024-03-31 (×7): qty 1

## 2024-03-31 MED ORDER — DAPAGLIFLOZIN PROPANEDIOL 10 MG PO TABS
10.0000 mg | ORAL_TABLET | Freq: Every day | ORAL | Status: DC
  Administered 2024-03-31 – 2024-04-08 (×9): 10 mg via ORAL
  Filled 2024-03-31 (×9): qty 1

## 2024-03-31 MED ORDER — METHYLPREDNISOLONE SODIUM SUCC 125 MG IJ SOLR
60.0000 mg | Freq: Two times a day (BID) | INTRAMUSCULAR | Status: DC
  Administered 2024-03-31 – 2024-04-01 (×2): 60 mg via INTRAVENOUS
  Filled 2024-03-31 (×2): qty 2

## 2024-03-31 MED ORDER — POTASSIUM CHLORIDE CRYS ER 10 MEQ PO TBCR
10.0000 meq | EXTENDED_RELEASE_TABLET | Freq: Two times a day (BID) | ORAL | Status: DC
  Administered 2024-03-31 – 2024-04-01 (×2): 10 meq via ORAL
  Filled 2024-03-31 (×2): qty 1

## 2024-03-31 NOTE — Plan of Care (Signed)

## 2024-03-31 NOTE — Progress Notes (Signed)
 Physical Therapy Treatment Patient Details Name: Miguel Howell MRN: [id number] DOB: [birth year] Today's Date: 03/31/2024   History of Present Illness Patient is a 68 year old male with worsening shortness of breath. PMH:  HTN, HLD, HFpEF, COPD    PT Comments  Patient agreeable to session. He was requesting to get out of bed on arrival to room. Supportive sister present throughout session. They are both requesting short term rehab placement at discharge given generalized weakness. The patient required assistance for bed mobility and transfers today. Limited distance ambulation with activity tolerance limited by fatigue. Improved balance using rolling walker for ambulation compared to the cane. Rehabilitation < 3 hours/day recommended after this hospital stay. PT will continue to follow.    If plan is discharge home, recommend the following: A little help with walking and/or transfers;A little help with bathing/dressing/bathroom;Assist for transportation;Help with stairs or ramp for entrance;Assistance with cooking/housework   Can travel by private vehicle     No  Equipment Recommendations  None recommended by PT    Recommendations for Other Services       Precautions / Restrictions Precautions Precautions: Fall Recall of Precautions/Restrictions: Intact Restrictions Weight Bearing Restrictions Per Provider Order: No     Mobility  Bed Mobility Overal bed mobility: Needs Assistance Bed Mobility: Supine to Sit     Supine to sit: Min assist     General bed mobility comments: assistance required for trunk support. cues for technique    Transfers Overall transfer level: Needs assistance Equipment used: Rolling walker (2 wheels) Transfers: Sit to/from Stand Sit to Stand: Min assist, From elevated surface           General transfer comment: lifting assistance required. verbal cues for technique and anterior weight shifting    Ambulation/Gait Ambulation/Gait assistance:  Contact guard assist Gait Distance (Feet): 15 Feet Assistive device: Rolling walker (2 wheels) Gait Pattern/deviations: Step-through pattern, Decreased stride length Gait velocity: decreased     General Gait Details: slow but steady with rolling walker. activity tolerance limited by fatigue. currently unable to walk a houshold distance at this time   Building Services Engineer Rankin (Stroke Patients Only)       Balance Overall balance assessment: Needs assistance Sitting-balance support: Feet supported Sitting balance-Leahy Scale: Good     Standing balance support: Bilateral upper extremity supported Standing balance-Leahy Scale: Fair Standing balance comment: balance improved with UE support on rolling walker with dynamic activity versus his cane during prior session                            Communication Communication Communication: Impaired Factors Affecting Communication: Reduced clarity of speech (low tone, essentially whispering)  Cognition Arousal: Alert Behavior During Therapy: Flat affect   PT - Cognitive impairments: Difficult to assess Difficult to assess due to: Impaired communication                     PT - Cognition Comments: patient is alert and following commands but keeps eyes closed unless he is walking. very soft tone of voice with minimal talking all together. he does report he wants SNF and sister is in agreement Following commands: Impaired Following commands impaired: Follows one step commands with increased time    Cueing Cueing Techniques: Verbal cues  Exercises  General Comments        Pertinent Vitals/Pain Pain Assessment Pain Assessment: No/denies pain    Home Living                          Prior Function            PT Goals (current goals can now be found in the care plan section) Acute Rehab PT Goals Patient Stated Goal: to get better PT  Goal Formulation: With patient/family Time For Goal Achievement: 04/10/24 Potential to Achieve Goals: Fair Progress towards PT goals: Progressing toward goals    Frequency    Min 2X/week      PT Plan      Co-evaluation              AM-PAC PT 6 Clicks Mobility   Outcome Measure  Help needed turning from your back to your side while in a flat bed without using bedrails?: A Little Help needed moving from lying on your back to sitting on the side of a flat bed without using bedrails?: A Lot Help needed moving to and from a bed to a chair (including a wheelchair)?: A Little Help needed standing up from a chair using your arms (e.g., wheelchair or bedside chair)?: A Little Help needed to walk in hospital room?: A Little Help needed climbing 3-5 steps with a railing? : A Lot 6 Click Score: 16    End of Session Equipment Utilized During Treatment: Oxygen Activity Tolerance: Patient limited by fatigue Patient left: in chair;with call bell/phone within reach;with family/visitor present Nurse Communication: Mobility status PT Visit Diagnosis: Muscle weakness (generalized) (M62.81);Unsteadiness on feet (R26.81)     Time: 9040-8985 PT Time Calculation (min) (ACUTE ONLY): 15 min  Charges:    $Therapeutic Activity: 8-22 mins PT General Charges $$ ACUTE PT VISIT: 1 Visit                     Miguel Howell, PT, MPT    Miguel Howell 03/31/2024, 10:25 AM

## 2024-03-31 NOTE — Progress Notes (Signed)
   03/31/24 1113  Spiritual Encounters  Type of Visit Initial  Care provided to: Pt and family  Reason for visit Advance directives  OnCall Visit No  Advance Directives (For Healthcare)  Does Patient Have a Medical Advance Directive? No  Would patient like information on creating a medical advance directive? Yes (Inpatient - patient defers creating a medical advance directive at this time - Information given)   Visited patient and wife per spiritual consult.  Explained AD paperwork and information.  Will have nurse call spiritual care when completed.

## 2024-03-31 NOTE — Progress Notes (Signed)
 Palliative Care Progress Note, Assessment & Plan   Patient Name: Miguel Howell       Date: 03/31/2024 DOB: [birth year]  Age: 68 y.o. MRN#: [id number] Attending Physician: Josette Ade, MD Primary Care Physician: Auston Reyes BIRCH, MD Admit Date: 03/25/2024  Subjective: Feels much better today.  Still having sore throat pain.  Eating meals.  Complains of productive cough.  No chest pain but does have exertional dyspnea.  Ready for rehab so he may go home with his sister.  Only used BiPAP overnight.  HPI: 68 y.o. male  with past medical history significant for HTN, HLD, HFpEF (50-55%, 03/26/2024) and COPD. Patient presented to ED 03/25/2024  from PCP after findings of hypoxia, c/o worsening SOB. Pt reported in the ED he has not been feeling well for 3-4 weeks with progressive SOB and increase in sputum production.    ED labs showed Na+ 132, chloride 69, Co2 42, BUN 36, creatinine 1.31, anion gap 21. HGB 18.7, BNP 1280.7.  EKG SR, HR 75 CXR without evidence of acute cardiopulmonary pathology.  ED vitals 165/87, HR 78, RR 16, SpO2 100% 2L, 97.7 F   TRH was consulted for admission and management of acute respiratory failure with hypoxia and hypercapnia, acute on chronic diastolic CHF, hypokalemia and CKF stage IIIa.    Palliative medicine was consulted for assistance with goals of care conversations.   Summary of counseling/coordination of care: Extensive chart review completed prior to meeting patient including labs, vital signs, imaging, progress notes, orders, and available advanced directive documents from current and previous encounters.      Latest Ref Rng & Units 03/30/2024    4:49 AM 03/29/2024    8:29 AM 03/28/2024    5:45 AM  CBC  WBC 4.0 - 10.5 K/uL 5.5  5.0  8.4   Hemoglobin 13.0 -  17.0 g/dL 82.4  82.6  82.1   Hematocrit 39.0 - 52.0 % 54.8  54.4  58.0   Platelets 150 - 400 K/uL 95  88  117       Latest Ref Rng & Units 03/31/2024    4:29 AM 03/30/2024    4:49 AM 03/29/2024    5:37 AM  CMP  Glucose 70 - 99 mg/dL 786  845  838   BUN 8 - 23 mg/dL 38  32  31   Creatinine 0.61 - 1.24 mg/dL 9.13  9.08  9.11   Sodium 135 - 145 mmol/L 135  136  131   Potassium 3.5 - 5.1 mmol/L 4.6  4.3  3.8   Chloride 98 - 111 mmol/L 89  90  86   CO2 22 - 32 mmol/L 36  36  35   Calcium  8.9 - 10.3 mg/dL 9.6  9.6  9.7      After reviewing the patient's chart and assessing the patient at bedside, I spoke with patient and Diane (sister) in regards to symptom management and goals of care.   Very pleasant male resting in bed with sister at bedside.  He is alert and oriented to self, time, location and situation.  He is able to participate in conversation making his wishes known.  Using O2 via [place] without BiPAP today.  Respirations are even and unlabored.  He is in no distress.  Diane shares that she received a list of possible STR facilities in [place]  where she lives.  She states the one she wants is not on the list.  Advised TOC would contact her about facilities with availability.  Discussed referral criteria facilities have for acceptance such as a level of care, bed availability and insurance coverage.  Diane understands and will speak with TOC about possibilities for STR.  Therapeutic silence and active listening provided for patient and sister to share their thoughts and emotions regarding current medical situation.  Emotional support provided.  Upon leaving the room, spoke with Lauraine SINNING, in regards to patient and sister wanting him to go to [facility] in [place] .  Lauraine advises she will contact this facility for criteria and insurance acceptance.   Physical Exam Vitals reviewed.  Constitutional:      General: He is not in acute distress.    Appearance:  He is obese.  HENT:     Head: Normocephalic and atraumatic.     Mouth/Throat:     Mouth: Mucous membranes are moist.  Pulmonary:     Effort: Pulmonary effort is normal. No respiratory distress.     Comments: O2 via [place] Musculoskeletal:     Right lower leg: No edema.     Left lower leg: No edema.  Neurological:     Mental Status: He is alert.  Psychiatric:        Mood and Affect: Mood normal.        Behavior: Behavior normal.        Thought Content: Thought content normal.        Judgment: Judgment normal.      Recommendations/Plan: DNR with interventions Continue current supportive interventions TOC facilitating transfer to STR in [place], [place] Palliative to follow peripherally for needs  Total Time 50 minutes   Discussed plan of care with [facility], Sarah  Time spent includes: Detailed review of medical records (labs, imaging, vital signs), medically appropriate exam (mental status, respiratory, cardiac, skin), discussed with treatment team, counseling and educating patient, family and staff, documenting clinical information, medication management and coordination of care.     Devere Sacks, ELNITA- [facility] Palliative Medicine Team  03/31/2024 10:21 AM  Office [phone]  Pager [phone]

## 2024-03-31 NOTE — Progress Notes (Signed)
 Heart Failure Nurse Navigator Progress Note  PCP: Miguel Reyes BIRCH, MD PCP-Cardiologist: None Admission Diagnosis: COPD exacerbation (HCC) Acute on chronic congestive heart failure, unspecified heart failure type (HCC) Hypoxia Shortness of breath  Admitted from: PCP visit   Presentation:   Miguel Howell is a 68 y.o. male.  He presented with respiratory distress.  Progressive shortness of breath, cough and orthopnea over the past few weeks.  He had a PCP visit earlier in the day and was sent to the ED for hypoxia.  History of COPD, Obesity, HTN and known dilated cardiomyopathy.  He is a smoker.  BP 165/87, Pulse 78. Chest x-ray: 03/25/24 No active cardiopulmonary disease & 03/28/24 Patchy airspace opacities in the right mid lung, which may represent atelectasis or a developing broncho pneumonia. BNP 1,280.7.  HS-Troponin 79. Patient's sister at the bedside [facility]).  ECHO/ LVEF: 50-55%.  TTE 06/2016 showed LVEF of 65-70% with G2DD. TTE 03/2024 noted LVEF 50-55%, mild LVH, and indeterminate diastolic dysfunction.    Clinical Course:  Past Medical History:  Diagnosis Date   Arthritis    bilateral hips and back   Asthma    CHF (congestive heart failure) (HCC)    COPD (chronic obstructive pulmonary disease) (HCC)    Dyspnea    Hypertension      Social History   Socioeconomic History   Marital status: Single    Spouse name: Not on file   Number of children: Not on file   Years of education: Not on file   Highest education level: Not on file  Occupational History   Not on file  Tobacco Use   Smoking status: Every Day    Current packs/day: 0.00    Average packs/day: 1.5 packs/day for 40.0 years (60.0 ttl pk-yrs)    Types: Cigarettes    Start date: 07/27/1975    Last attempt to quit: 07/27/2015    Years since quitting: 8.6   Smokeless tobacco: Former  Building Services Engineer status: Never Used  Substance and Sexual Activity   Alcohol use: Not Currently   Drug use: No    Sexual activity: Not on file  Other Topics Concern   Not on file  Social History Narrative   Not on file   Social Drivers of Health   Financial Resource Strain: Low Risk  (05/07/2023)   Received from [facility]   Overall Financial Resource Strain (CARDIA)    Difficulty of Paying Living Expenses: Not hard at all  Food Insecurity: No Food Insecurity (03/27/2024)   Hunger Vital Sign    Worried About Running Out of Food in the Last Year: Never true    Ran Out of Food in the Last Year: Never true  Transportation Needs: No Transportation Needs (03/27/2024)   PRAPARE - Administrator, Civil Service (Medical): No    Lack of Transportation (Non-Medical): No  Physical Activity: Not on file  Stress: Not on file  Social Connections: Unknown (03/27/2024)   Social Connection and Isolation Panel    Frequency of Communication with Friends and Family: More than three times a week    Frequency of Social Gatherings with Friends and Family: Patient declined    Attends Religious Services: Patient declined    Database Administrator or Organizations: Patient declined    Attends Banker Meetings: Patient declined    Marital Status: Patient declined   Education Assessment and Provision:  Detailed education and instructions provided on heart failure  disease management including the following:  Signs and symptoms of Heart Failure When to call the physician Importance of daily weights Low sodium diet Fluid restriction Medication management Anticipated future follow-up appointments  Patient education given on each of the above topics.  Patient acknowledges understanding via teach back method and acceptance of all instructions.  Education Materials:  Living Better With Heart Failure Booklet, HF zone tool, & Daily Weight Tracker Tool.  Patient has scale at home: Yes Patient has pill box at home: Yes    High Risk Criteria for Readmission and/or Poor  Patient Outcomes: Heart failure hospital admissions (last 6 months): 1  No Show rate: 11% Difficult social situation: None Demonstrates medication adherence: Yes Primary Language: English Literacy level: 2 Years of College.  Reading, Writing, & Comprehension.  Barriers of Care:   AHF TOC will most likely pushed out due to SNF not in the area.  His sister Diane will reschedule as needed.  Smoking cessation  Considerations/Referrals:  Referral made to Heart Failure Pharmacist Stewardship: Yes Referral made to Heart Failure CSW/NCM TOC: No Referral made to Heart & Vascular TOC clinic: Yes. [facility] 04/07/2024. Current plan per sister [name] is that patient might be going to SNF closer to [place] .  After he completes that he will come back to his home in [place], [place].  Diane will reschedule his AHF TOC as needed.  Items for Follow-up on DC/TOC: Daily Weights Diet & Fluid Restrictions Smoking Cessation Continued Heart Failure Education  Charmaine Pines, RN, BSN [facility] Heart Failure Navigator Secure Chat Only

## 2024-03-31 NOTE — Progress Notes (Signed)
 Progress Note   Patient: Miguel Howell FMW:[id number] DOB: 1955/11/28 DOA: 03/25/2024     6 DOS: the patient was seen and examined on 03/31/2024   Brief hospital course: 68 y.o. year old male with past medical history of HTN, HLD, HFpEF, COPD presenting to the ED with worsening shortness of breath.      Pt reports he has been feeling unwell for 3-4 weeks. He has been getting more short of breath but this improves and then gets worse. Over the last few days it has been getting worse.  Patient also has worsening sputum production.     ED Course: On arrival to the ED patient was noted to be HDS stable. CBC without leukocytosis, but macrocytosis and erythrocytosis. BMP with hyponatremia, AKI and elevated bicarb. CXR without any acute findings.   10/22.  TOC consultation for noninvasive ventilation.  Will change BiPAP to nighttime.  Continue IV Lasix .  Echocardiogram shows an EF of 50%.  Replace potassium IV and orally 10/23.  Change Lasix  over to Diamox with CO2 on chemistry greater than 45.  Replace potassium orally. 10/24.  Patient with worsening mental status and pCO2 elevated at 107.  Placed back on continuous BiPAP.  Restarted Lasix .  Pulmonary consulted and started high-dose Solu-Medrol . 10/25.  Palliative care consulted and patient made a DNR with intervention (no CPR or chest compressions but may intubate) 10/26.  Patient wanting to come off the BiPAP so he can eat.  Patient states that he has not worked with physical therapy.  Will reconsult physical therapy. 10/27.   Physical therapy changed recommendations to rehab.  pCO2 83 with a pH of 7.34.  Creatinine 0.86  Assessment and Plan: * Acute on chronic respiratory failure with hypoxia and hypercapnia (HCC) Patient states he does not wear oxygen at home.  Venous blood gas shows a pCO2 of 93.  Qualifies for noninvasive ventilation at home.  Will have TOC look into this.  ABG shows a pCO2 of 91.  Continue BiPAP at night.  Patient also  qualifies for oxygen at home with pulse ox dropping into the 70s with ambulation.  Will have to set up home oxygen.  10/24.  I had difficulty waking him up this morning.  Patient had some difficulty taking pills this morning with nursing staff.  Venous blood gas shows a pCO2 of 107 with a pH of 7.31.  Placed on continuous BiPAP with higher pressures than he previously was on last night.  Appreciate pulmonary consultation.  10/25.  Made limited code with palliative care.   10/27.  Patient seen on nasal cannula this morning.  Wore the BiPAP last night.  pCO2 this morning down to 83.  Acute on chronic diastolic CHF (congestive heart failure) (HCC) With anasarca.  EF 50 to 55%.  Will continue IV Lasix  60 mg IV twice daily.  Continue spironolactone and Cozaar and low-dose Coreg .  Add Jardiance.  Weight down from 250 down to 226.6.  COPD exacerbation (HCC) Taper Solu-Medrol .  Continue nebulizer treatments  Lobar pneumonia Patient completed Zithromax  will continue Rocephin .  Hypokalemia Potassium in normal range.  Continue spironolactone and ARB.  HTN (hypertension) Continue Coreg , Cozaar and spironolactone.  Acute kidney injury superimposed on CKD Creatinine improved to 1.1 with diuresis, creatinine peak 1.37.  Likely underlying chronic kidney disease stage IIIa.  Today's creatinine 0.86.  Obesity (BMI 30-39.9) BMI 33.46 with height and weight listed in computer.  Acute urinary retention Continue Flomax.  In-N-Out catheterizations if needed  Thrombocytopenia Continue to  monitor.  Hepatitis C negative  Erythrocytosis Likely secondary to chronic hypoxia.  Pending JAK2 and erythropoietin level.        Subjective: Patient has hoarse voice.  Feeling a little bit better.  Leg swelling is better.  Breathing little bit better.  Admitted with COPD exacerbation and CHF exacerbation.  Physical Exam: Vitals:   03/31/24 0009 03/31/24 0306 03/31/24 0513 03/31/24 0759  BP: 128/72 134/88  (!)  161/109  Pulse: 74 76  (!) 101  Resp: 20 20  17   Temp: 97.7 F (36.5 C) 98.1 F (36.7 C)  97.6 F (36.4 C)  TempSrc: Axillary Axillary    SpO2: 94% 98%  100%  Weight:   102.8 kg   Height:       Physical Exam HENT:     Head: Normocephalic.     Mouth/Throat:     Pharynx: No oropharyngeal exudate.  Eyes:     General: Lids are normal.     Conjunctiva/sclera: Conjunctivae normal.  Cardiovascular:     Rate and Rhythm: Normal rate and regular rhythm.     Heart sounds: Normal heart sounds, S1 normal and S2 normal.  Pulmonary:     Breath sounds: Examination of the right-lower field reveals decreased breath sounds. Examination of the left-lower field reveals decreased breath sounds. Decreased breath sounds present. No wheezing, rhonchi or rales.  Abdominal:     Palpations: Abdomen is soft.     Tenderness: There is no abdominal tenderness.  Musculoskeletal:     Right lower leg: Swelling present.     Left lower leg: Swelling present.     Comments: Lower extremity swelling improved from admission  Skin:    General: Skin is warm.     Findings: No rash.     Comments: Bruising lower abdomen  Neurological:     Mental Status: He is alert.     Comments: Answers all questions appropriately     Data Reviewed: Venous blood gas showing a pCO2 of 83 and a pH of 7.34 Creatinine 0.86, CO2 36, chloride 89  Family Communication: Sister at bedside  Disposition: Status is: Inpatient Remains inpatient appropriate because: Physical therapy changing their recommendations to rehab.  Will need a rehab bed, insurance authorization and BiPAP or NIV at the rehab.  Planned Discharge Destination: Rehab    Time spent: 28 minutes Case discussed with [facility]  Author: Charlie Patterson, MD 03/31/2024 10:56 AM  For on call review [email or web address].

## 2024-03-31 NOTE — TOC Progression Note (Addendum)
 Transition of Care [facility]) - Progression Note    Patient Details  Name: Miguel Howell MRN: [id number] Date of Birth: 10-12-1955  Transition of Care [facility]) CM/SW Contact  Lauraine [name], LCSW Phone Number: 03/31/2024, 12:29 PM  Clinical Narrative:   CSW met with patient. He confirmed preference for a SNF near [place] where his sister lives. Per [employer], only [facility], [place], [facility], and [facility] are in network. Sister's preference is [facility]. CSW is trying to get in touch with the admissions coordinator but no answer or voicemail available. They are the sister facility to [facility] so the admissions coordinator will try to find the contact person. Sister willing to pay out of pocket if patient has out-of-network benefits for [facility]. [facility] said they will not have a bed for at least a week. Sister does not want any of the other facilities. Patient is on new oxygen. Discussed that insurance will not pay for ambulance transport outside of 50 miles and we cannot order home oxygen for transport to SNF. Patient's brother-in-law is a theatre stage manager and they use an ambulance transport service. Sister may be able to get them to transport if needed. Discussed potential need for SNF placement within 50 miles of the hospital until he is strong enough to discharge to sister's home. They are agreeable if needed. Respiratory will utilize bipap settings tonight so this information can be relayed to the SNF's. [employer] liaison has been updated. He is checking to see if they have sites near [place].   3:19 pm: [name] admissions coordinator confirmed they are not in network with [employer] but she will check to see if patient has any out-of-network benefits. CSW faxed referral.  Expected Discharge Plan and Services                                               Social Drivers of Health (SDOH) Interventions SDOH  Screenings   Food Insecurity: No Food Insecurity (03/27/2024)  Housing: Low Risk  (03/27/2024)  Transportation Needs: No Transportation Needs (03/27/2024)  Utilities: Not At Risk (03/27/2024)  Financial Resource Strain: Low Risk  (05/07/2023)   Received from [facility]  Social Connections: Unknown (03/27/2024)  Tobacco Use: High Risk (03/25/2024)    Readmission Risk Interventions     No data to display

## 2024-03-31 NOTE — [place] FL2 (Signed)
 [place]  MEDICAID FL2 LEVEL OF CARE FORM     IDENTIFICATION  Patient Name: Miguel Howell Birthdate: 01-08-1956 Sex: male Admission Date (Current Location): 03/25/2024  [place] and [place] Number:  Chiropodist and Address:  [facility], [address], [place], [place] [address]      Provider Number: [id number]  Attending Physician Name and Address:  Josette Ade, MD  Relative Name and Phone Number:       Current Level of Care: Hospital Recommended Level of Care: Skilled Nursing Facility Prior Approval Number:    Date Approved/Denied:   PASRR Number: [id number]  Discharge Plan: SNF    Current Diagnoses: Patient Active Problem List   Diagnosis Date Noted   Hypoxia 03/31/2024   Erythrocytosis 03/29/2024   Thrombocytopenia 03/29/2024   Acute urinary retention 03/29/2024   Acute on chronic respiratory failure with hypoxia and hypercapnia (HCC) 03/28/2024   Obesity (BMI 30-39.9) 10/19/2021   Acute kidney injury superimposed on CKD 10/18/2021   Chronic diastolic CHF (congestive heart failure) (HCC) 10/18/2021   COVID-19 virus infection 10/16/2021   Status post total hip replacement, 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 Bipap QHS: EPAP 8. 40%. Respiratory will put him on bipap settings tonight to determine IPAP.) Continent Weight: 226 lb 9.6 oz (102.8 kg) Height:  5' 9 (175.3 cm)  BEHAVIORAL SYMPTOMS/MOOD  NEUROLOGICAL BOWEL NUTRITION STATUS   (None)  (None) Continent Diet (Heart healthy)  AMBULATORY STATUS COMMUNICATION OF NEEDS Skin   Limited Assist Verbally Bruising, Other (Comment) (Erythema/redness.)                       Personal Care Assistance Level of Assistance  Bathing, Dressing, Feeding Bathing Assistance: Limited assistance Feeding assistance: Limited assistance Dressing Assistance: Limited assistance     Functional Limitations Info  Sight, Hearing, Speech Sight Info: Adequate Hearing Info: Adequate Speech Info: Adequate    SPECIAL CARE FACTORS FREQUENCY  PT (By licensed PT), OT (By licensed OT)     PT Frequency: 5 x week OT Frequency: 5 x week            Contractures Contractures Info: Not present    Additional Factors Info  Code Status, Allergies Code Status Info: DNR Allergies Info: NKDA           Current Medications (03/31/2024):  This is the current hospital active medication list Current Facility-Administered Medications  Medication Dose Route Frequency Provider Last Rate Last Admin   acetaminophen  (TYLENOL ) tablet 650 mg  650 mg Oral Q6H PRN Fernand Prost, MD       Or   acetaminophen  (TYLENOL ) suppository 650 mg  650 mg Rectal Q6H PRN Khan, Ghalib, MD       amitriptyline (ELAVIL) tablet 25 mg  25 mg Oral QHS Josette Ade, MD   25 mg at 03/30/24 2126   aspirin  EC tablet 81 mg  81 mg Oral Daily Josette Ade, MD   81 mg at 03/31/24 9076   budesonide  (PULMICORT )  nebulizer solution 0.5 mg  0.5 mg Nebulization BID Khan, Ghalib, MD   0.5 mg at 03/31/24 0745   carvedilol  (COREG ) tablet 3.125 mg  3.125 mg Oral BID WC Josette Ade, MD   3.125 mg at 03/31/24 9077   cefTRIAXone  (ROCEPHIN ) 2 g in sodium chloride  0.9 % 100 mL IVPB  2 g Intravenous Q24H Josette Ade, MD 200 mL/hr at 03/30/24 1406 2 g at 03/30/24 1406   dapagliflozin propanediol (FARXIGA) tablet 10 mg  10 mg Oral Daily Josette Ade, MD   10 mg at 03/31/24 9076   diazepam   (VALIUM ) tablet 10 mg  10 mg Oral QHS PRN Josette Ade, MD   10 mg at 03/28/24 2135   docusate sodium  (COLACE) capsule 100 mg  100 mg Oral BID Josette Ade, MD   100 mg at 03/31/24 9076   enoxaparin  (LOVENOX ) injection 55 mg  55 mg Subcutaneous Q24H Hallaji, Sheema M, RPH   55 mg at 03/30/24 2126   fluconazole (DIFLUCAN) tablet 100 mg  100 mg Oral Daily Wieting, Richard, MD       fluticasone  furoate-vilanterol (BREO ELLIPTA ) 200-25 MCG/ACT 1 puff  1 puff Inhalation Daily Josette Ade, MD   1 puff at 03/31/24 9075   folic acid  (FOLVITE ) tablet 1 mg  1 mg Oral Daily Josette Ade, MD   1 mg at 03/31/24 9077   furosemide  (LASIX ) injection 60 mg  60 mg Intravenous BID Josette Ade, MD   60 mg at 03/31/24 9077   ipratropium-albuterol  (DUONEB) 0.5-2.5 (3) MG/3ML nebulizer solution 3 mL  3 mL Nebulization BID Josette Ade, MD   3 mL at 03/31/24 0745   losartan (COZAAR) tablet 25 mg  25 mg Oral Daily Josette Ade, MD   25 mg at 03/31/24 9076   methylPREDNISolone  sodium succinate (SOLU-MEDROL ) 125 mg/2 mL injection 60 mg  60 mg Intravenous Q12H Wieting, Richard, MD       montelukast  (SINGULAIR ) tablet 10 mg  10 mg Oral Daily Josette Ade, MD   10 mg at 03/31/24 9076   nicotine  (NICODERM CQ  - dosed in mg/24 hours) patch 14 mg  14 mg Transdermal Daily Khan, Ghalib, MD   14 mg at 03/31/24 0936   pantoprazole (PROTONIX) EC tablet 40 mg  40 mg Oral Daily Josette Ade, MD   40 mg at 03/31/24 9076   potassium chloride  (KLOR-CON  M) CR tablet 10 mEq  10 mEq Oral BID Josette Ade, MD       senna-docusate (Senokot-S) tablet 1 tablet  1 tablet Oral QHS PRN Khan, Ghalib, MD       spironolactone (ALDACTONE) tablet 25 mg  25 mg Oral Daily Josette Ade, MD   25 mg at 03/31/24 0923   tamsulosin (FLOMAX) capsule 0.4 mg  0.4 mg Oral Daily Josette Ade, MD   0.4 mg at 03/31/24 9076   venlafaxine XR (EFFEXOR-XR) 24 hr capsule 75 mg  75 mg Oral Q breakfast Josette Ade, MD   75  mg at 03/31/24 9076     Discharge Medications: Please see discharge summary for a list of discharge medications.  Relevant Imaging Results:  Relevant Lab Results:   Additional Information SS#: [id number]  Lauraine [name], LCSW

## 2024-03-31 NOTE — Progress Notes (Signed)
   03/31/24 1400  Spiritual Encounters  Type of Visit Follow up  Care provided to: Pt and family  Conversation partners present during encounter Other (comment) Producer, Television/film/video, 2 volunteers)  Reason for visit Advance directives  OnCall Visit No  Advance Directives (For Healthcare)  Does Patient Have a Medical Advance Directive? Yes  Type of Advance Directive Healthcare Power of Attorney  Copy of Healthcare Power of Attorney in Chart? Yes - validated most recent copy scanned in chart (See row information)   Followed up with patient and family.  AD paperwork completed, signed, notarized, and in chart.

## 2024-03-31 NOTE — Progress Notes (Signed)
 Heart Failure Stewardship Pharmacy Note  PCP: Auston Reyes BIRCH, MD PCP-Cardiologist: None  HPI: Miguel Howell is a 68 y.o. male with HTN, HLD, HFpEF, COPD who presented with progressive shortness of breath over 3-4 weeks with sputum production. On admission, BNP was 1280.7, HS-troponin was 79, and A1c 6.4. Chest x-ray noted no active cardiopulmonary disease.   Pertinent cardiac history: TTE 06/2016 showed LVEF of 65-70% with G2DD. TTE 03/2024 noted LVEF 50-55%, mild LVH, and indeterminate diastolic dysfunction.  Pertinent Lab Values: Creatinine, Ser  Date Value Ref Range Status  03/31/2024 0.86 0.61 - 1.24 mg/dL Final   BUN  Date Value Ref Range Status  03/31/2024 38 (H) 8 - 23 mg/dL Final   Potassium  Date Value Ref Range Status  03/31/2024 4.6 3.5 - 5.1 mmol/L Final   Sodium  Date Value Ref Range Status  03/31/2024 135 135 - 145 mmol/L Final   B Natriuretic Peptide  Date Value Ref Range Status  03/25/2024 1,280.7 (H) 0.0 - 100.0 pg/mL Final    Comment:    Performed at [facility], [address], [place], [place] [address]   Magnesium   Date Value Ref Range Status  03/26/2024 2.1 1.7 - 2.4 mg/dL Final    Comment:    Performed at [facility], [address] [place], [place] [address]   Hgb A1c MFr Bld  Date Value Ref Range Status  03/26/2024 6.4 (H) 4.8 - 5.6 % Final    Comment:    (NOTE) Diagnosis of Diabetes The following HbA1c ranges recommended by the American Diabetes Association (ADA) may be used as an aid in the diagnosis of diabetes mellitus.  Hemoglobin             Suggested A1C NGSP%              Diagnosis  <5.7                   Non Diabetic  5.7-6.4                Pre-Diabetic  >6.4                   Diabetic  <7.0                   Glycemic control for                       adults with diabetes.      Vital Signs: Temp:  [97.6 F (36.4 C)-98.1 F (36.7 C)] 98.1 F (36.7 C) (10/27 0306) Pulse Rate:  [74-84] 76  (10/27 0306) Cardiac Rhythm: Normal sinus rhythm (10/26 1901) Resp:  [17-20] 20 (10/27 0306) BP: (103-143)/(51-88) 134/88 (10/27 0306) SpO2:  [92 %-100 %] 98 % (10/27 0306) FiO2 (%):  [40 %] 40 % (10/26 2144) Weight:  [102.8 kg (226 lb 9.6 oz)] 102.8 kg (226 lb 9.6 oz) (10/27 0513)  Intake/Output Summary (Last 24 hours) at 03/31/2024 0741 Last data filed at 03/31/2024 0350 Gross per 24 hour  Intake --  Output 1590 ml  Net -1590 ml    Current Heart Failure Medications:  Loop diuretic: none Beta-Blocker: carvedilol  3.125 mg BID ACEI/ARB/ARNI: losartan 25 mg daily MRA: spironolactone 25 mg daily SGLT2i: none Other: none  Prior to admission Heart Failure Medications:  Loop diuretic: furosemide  40 mg daily Beta-Blocker: carvedilol  3.125 mg BID ACEI/ARB/ARNI: none MRA: none SGLT2i: none Other: none  Assessment: 1. Acute on chronic  diastolic heart failure (LVEF 50-55%)  , due to NICM. NYHA class III-IV symptoms.  -Symptoms: Reports feeling much better today. Throat is sore, but able to breathe better. No notable LEE.  -Volume: Volume is difficult to assess due to body habitus. Remaining symptoms are more likely due to chronic respiratory failure as opposed to hypervolemia. Currently on furosemide  60 mg IV BID. Consider transition to oral diuretics today. -Hemodynamics: BP is stable. HR 80s. -BB: Currently on carvedilol  3.125 mg BID. Given COPD, patient may benefit from stopping or transitioning to metoprolol or bisoprolol instead. -ACEI/ARB/ARNI: Due to hypertension and modest benefit in HFpEF, consider transitioning to Entresto 24-26 mg BID. -MRA: Continue spironolactone 25 mg daily -SGLT2i: Consider adding Farxiga or Jardiance to mg daily as first line therapy for HFpEF  Plan: 1) Medication changes recommended at this time: -Consider adding Farxiga or Jardiance 10 mg daily   -Consdier transitioning to an equivalent dose of a beta-1 selective beta blocker (metoprolol or  bisoprolol) given concomitant COPD with tenuous respiratory status. -Consider transitioning from losartan to Entresto 24-26 mg BID  2) Patient assistance: -Copays for Farxiga, Jardiance and Entresto are $0  3) Education: - Patient has been educated on current HF medications and potential additions to HF medication regimen - Patient verbalizes understanding that over the next few months, these medication doses may change and more medications may be added to optimize HF regimen - Patient has been educated on basic disease state pathophysiology and goals of therapy   Medication Assistance / Insurance Benefits Check: Does the patient have prescription insurance?    Please do not hesitate to reach out with questions or concerns,  Jaun Bash, PharmD, CPP, BCPS, [facility] Heart Failure Pharmacist  Phone - [phone] 03/31/2024 7:41 AM

## 2024-04-01 DIAGNOSIS — J9621 Acute and chronic respiratory failure with hypoxia: Secondary | ICD-10-CM | POA: Diagnosis not present

## 2024-04-01 DIAGNOSIS — J181 Lobar pneumonia, unspecified organism: Secondary | ICD-10-CM | POA: Diagnosis not present

## 2024-04-01 DIAGNOSIS — J441 Chronic obstructive pulmonary disease with (acute) exacerbation: Secondary | ICD-10-CM | POA: Diagnosis not present

## 2024-04-01 DIAGNOSIS — B370 Candidal stomatitis: Secondary | ICD-10-CM | POA: Insufficient documentation

## 2024-04-01 DIAGNOSIS — I5033 Acute on chronic diastolic (congestive) heart failure: Secondary | ICD-10-CM | POA: Diagnosis not present

## 2024-04-01 LAB — BASIC METABOLIC PANEL WITH GFR
Anion gap: 10 (ref 5–15)
BUN: 41 mg/dL — ABNORMAL HIGH (ref 8–23)
CO2: 39 mmol/L — ABNORMAL HIGH (ref 22–32)
Calcium: 9.8 mg/dL (ref 8.9–10.3)
Chloride: 92 mmol/L — ABNORMAL LOW (ref 98–111)
Creatinine, Ser: 1.15 mg/dL (ref 0.61–1.24)
GFR, Estimated: >60 mL/min (ref >60)
Glucose, Bld: 171 mg/dL — ABNORMAL HIGH (ref 70–99)
Potassium: 4.6 mmol/L (ref 3.5–5.1)
Sodium: 141 mmol/L (ref 135–145)

## 2024-04-01 LAB — BLOOD GAS, VENOUS
Acid-Base Excess: 19.5 mmol/L — ABNORMAL HIGH (ref 0.0–2.0)
Bicarbonate: 51 mmol/L — ABNORMAL HIGH (ref 20.0–28.0)
Delivery systems: POSITIVE
FIO2: 40 %
O2 Saturation: 64.8 %
Patient temperature: 37
pCO2, Ven: 99 mmHg (ref 44–60)
pH, Ven: 7.32 (ref 7.25–7.43)
pO2, Ven: 41 mmHg (ref 32–45)

## 2024-04-01 LAB — CBC
HCT: 58.8 % — ABNORMAL HIGH (ref 39.0–52.0)
Hemoglobin: 18.4 g/dL — ABNORMAL HIGH (ref 13.0–17.0)
MCH: 35.5 pg — ABNORMAL HIGH (ref 26.0–34.0)
MCHC: 31.3 g/dL (ref 30.0–36.0)
MCV: 113.3 fL — ABNORMAL HIGH (ref 80.0–100.0)
Platelets: 75 K/uL — ABNORMAL LOW (ref 150–400)
RBC: 5.19 MIL/uL (ref 4.22–5.81)
RDW: 14.1 % (ref 11.5–15.5)
WBC: 5 K/uL (ref 4.0–10.5)
nRBC: 0 % (ref 0.0–0.2)

## 2024-04-01 MED ORDER — TORSEMIDE 20 MG PO TABS
40.0000 mg | ORAL_TABLET | Freq: Every day | ORAL | Status: DC
  Administered 2024-04-01 – 2024-04-08 (×8): 40 mg via ORAL
  Filled 2024-04-01 (×8): qty 2

## 2024-04-01 MED ORDER — METOPROLOL SUCCINATE ER 25 MG PO TB24
25.0000 mg | ORAL_TABLET | Freq: Every day | ORAL | Status: DC
  Administered 2024-04-01 – 2024-04-08 (×8): 25 mg via ORAL
  Filled 2024-04-01 (×8): qty 1

## 2024-04-01 MED ORDER — POTASSIUM CHLORIDE CRYS ER 10 MEQ PO TBCR
10.0000 meq | EXTENDED_RELEASE_TABLET | Freq: Every day | ORAL | Status: DC
  Administered 2024-04-02 – 2024-04-04 (×3): 10 meq via ORAL
  Filled 2024-04-01 (×3): qty 1

## 2024-04-01 MED ORDER — METHYLPREDNISOLONE SODIUM SUCC 125 MG IJ SOLR
60.0000 mg | Freq: Every day | INTRAMUSCULAR | Status: DC
Start: 2024-04-02 — End: 2024-04-04
  Administered 2024-04-02 – 2024-04-04 (×3): 60 mg via INTRAVENOUS
  Filled 2024-04-01 (×3): qty 2

## 2024-04-01 NOTE — Progress Notes (Signed)
 Venous abg was slightly worse after change from AVAPS to regular bipap, however, gas was done while patient was off bipap. Spoke with patient about being placed back on bipap and he states he does not want to go on anymore tonight. RN and covering MD are aware.

## 2024-04-01 NOTE — Assessment & Plan Note (Signed)
 Improved.  Initially on IV Diflucan now on oral.

## 2024-04-01 NOTE — Progress Notes (Addendum)
 Progress Note   Patient: Miguel Howell FMW:[id number] DOB: 07/13/1955 DOA: 03/25/2024     7 DOS: the patient was seen and examined on 04/01/2024   Brief hospital course: 68 y.o. year old male with past medical history of HTN, HLD, HFpEF, COPD presenting to the ED with worsening shortness of breath.      Pt reports he has been feeling unwell for 3-4 weeks. He has been getting more short of breath but this improves and then gets worse. Over the last few days it has been getting worse.  Patient also has worsening sputum production.     ED Course: On arrival to the ED patient was noted to be HDS stable. CBC without leukocytosis, but macrocytosis and erythrocytosis. BMP with hyponatremia, AKI and elevated bicarb. CXR without any acute findings.   10/22.  TOC consultation for noninvasive ventilation.  Will change BiPAP to nighttime.  Continue IV Lasix .  Echocardiogram shows an EF of 50%.  Replace potassium IV and orally 10/23.  Change Lasix  over to Diamox with CO2 on chemistry greater than 45.  Replace potassium orally. 10/24.  Patient with worsening mental status and pCO2 elevated at 107.  Placed back on continuous BiPAP.  Restarted iv Lasix .  Pulmonary consulted and started high-dose Solu-Medrol . 10/25.  Palliative care consulted and patient made a DNR with intervention (no CPR or chest compressions but may intubate) 10/26.  Patient wanting to come off the BiPAP so he can eat.  Patient states that he has not worked with physical therapy.  Will reconsult physical therapy. 10/27.   Physical therapy changed recommendations to rehab.  pCO2 83 with a pH of 7.34.  Creatinine 0.86 10/28.  This morning's VBG was drawn on oxygen and not the BiPAP.  pCO2 is 99.  This morning's creatinine up to 1.15.  Will switch over to torsemide.  Assessment and Plan: * Acute on chronic respiratory failure with hypoxia and hypercapnia (HCC) Patient states he does not wear oxygen at home.  Venous blood gas shows a pCO2  of 93.  Qualifies for noninvasive ventilation at home.  Will have TOC look into this.  ABG shows a pCO2 of 91.  Continue BiPAP at night.  Patient also qualifies for oxygen at home with pulse ox dropping into the 70s with ambulation.  Will have to set up home oxygen.  10/24.  I had difficulty waking him up this morning.  Patient had some difficulty taking pills this morning with nursing staff.  Venous blood gas shows a pCO2 of 107 with a pH of 7.31.  Placed on continuous BiPAP with higher pressures than he previously was on last night.  Appreciate pulmonary consultation.  10/25.  Made limited code with palliative care.   10/27.  Patient seen on nasal cannula this morning.  Wore the BiPAP last night.  pCO2 this morning down to 83. 10/28 pCO2 99 but this was done on nasal cannula not the BiPAP as per respiratory.  Acute on chronic diastolic CHF (congestive heart failure) (HCC) With anasarca.  EF 50 to 55%.  Switch IV Lasix  over to torsemide.  Continue spironolactone and Cozaar and low-dose Toprol.  Continue Jardiance.  Weight down from 250 down to 222.6.  COPD exacerbation (HCC) Continue to taper Solu-Medrol .  Continue nebulizer treatments  Lobar pneumonia Patient completed Zithromax  will continue Rocephin .  Hypokalemia Potassium in normal range.  Continue spironolactone and ARB.  Continue to taper potassium supplementation.  HTN (hypertension) Continue Toprol, Cozaar and spironolactone.  Acute kidney injury  superimposed on CKD Creatinine peak 1.37.  Likely underlying chronic kidney disease stage IIIa.  Lowest creatinine creatinine 0.86.  Today's creatinine 1.15.  Switch IV Lasix  over to torsemide.  Obesity (BMI 30-39.9) BMI 33.46 with height and weight listed in computer.  Thrush Improved.  Initially on IV Diflucan now on oral.  Acute urinary retention Continue Flomax.  In-N-Out catheterizations if needed  Thrombocytopenia Continue to monitor.  Hepatitis C negative.  Platelet count  75.  Will hold off on Lovenox  injections.  Erythrocytosis Likely secondary to chronic hypoxia.  Pending JAK2.  Erythropoietin level actually low.        Subjective: Patient feeling a little short of breath today.  Some cough.  Some hoarse voice.  Admitted with CHF and COPD exacerbation.  Physical Exam: Vitals:   04/01/24 0515 04/01/24 0756 04/01/24 0810 04/01/24 1211  BP:  118/67  131/73  Pulse:  79  86  Resp:  16  18  Temp:  98 F (36.7 C)  97.7 F (36.5 C)  TempSrc:      SpO2:  91% 91% 97%  Weight: 101 kg     Height:       Physical Exam HENT:     Head: Normocephalic.     Mouth/Throat:     Pharynx: No oropharyngeal exudate.  Eyes:     General: Lids are normal.     Conjunctiva/sclera: Conjunctivae normal.  Cardiovascular:     Rate and Rhythm: Normal rate and regular rhythm.     Heart sounds: Normal heart sounds, S1 normal and S2 normal.  Pulmonary:     Breath sounds: Examination of the right-lower field reveals decreased breath sounds and rhonchi. Examination of the left-lower field reveals decreased breath sounds and rhonchi. Decreased breath sounds and rhonchi present. No wheezing or rales.  Abdominal:     Palpations: Abdomen is soft.     Tenderness: There is no abdominal tenderness.  Musculoskeletal:     Right lower leg: No swelling.     Left lower leg: No swelling.     Comments: Lower extremity swelling improved from admission  Skin:    General: Skin is warm.     Findings: No rash.     Comments: Bruising lower abdomen  Neurological:     Mental Status: He is alert.     Comments: Answers all questions appropriately     Data Reviewed: pCO2 99 on oxygen.,  Creatinine 1.15, potassium 4.6, CO2 on chemistry 39, white blood cell count 5.0, hemoglobin 18.4, platelet count 70, erythropoietin level low at 2.2  Family Communication: Sister at bedside  Disposition: Status is: Inpatient Remains inpatient appropriate because: TOC working on english as a second language teacher  for rehab and BiPAP at facility.  Planned Discharge Destination: Rehab    Time spent: 28 minutes  Author: Charlie Patterson, MD 04/01/2024 5:46 PM  For on call review [email or web address].

## 2024-04-01 NOTE — Progress Notes (Signed)
 I explained to pt importance of wearing BiPAP as longest as possible. Pt agreed to stay on Bipap for few more hours. Pt placed back on Bipap. No change in mental status at this time. Will continue to monitor pt.

## 2024-04-01 NOTE — Progress Notes (Signed)
 Heart Failure Stewardship Pharmacy Note  PCP: Auston Reyes BIRCH, MD PCP-Cardiologist: None  HPI: Miguel Howell is a 68 y.o. male with HTN, HLD, HFpEF, COPD who presented with progressive shortness of breath over 3-4 weeks with sputum production. On admission, BNP was 1280.7, HS-troponin was 79, and A1c 6.4. Chest x-ray noted no active cardiopulmonary disease.   Pertinent cardiac history: TTE 06/2016 showed LVEF of 65-70% with G2DD. TTE 03/2024 noted LVEF 50-55%, mild LVH, and indeterminate diastolic dysfunction.  Pertinent Lab Values: Creatinine, Ser  Date Value Ref Range Status  04/01/2024 1.15 0.61 - 1.24 mg/dL Final   BUN  Date Value Ref Range Status  04/01/2024 41 (H) 8 - 23 mg/dL Final   Potassium  Date Value Ref Range Status  04/01/2024 4.6 3.5 - 5.1 mmol/L Final   Sodium  Date Value Ref Range Status  04/01/2024 141 135 - 145 mmol/L Final   B Natriuretic Peptide  Date Value Ref Range Status  03/25/2024 1,280.7 (H) 0.0 - 100.0 pg/mL Final    Comment:    Performed at [facility], [address] [place], [place] [address]   Magnesium   Date Value Ref Range Status  03/26/2024 2.1 1.7 - 2.4 mg/dL Final    Comment:    Performed at [facility], [address] [place], [place] [address]   Hgb A1c MFr Bld  Date Value Ref Range Status  03/26/2024 6.4 (H) 4.8 - 5.6 % Final    Comment:    (NOTE) Diagnosis of Diabetes The following HbA1c ranges recommended by the American Diabetes Association (ADA) may be used as an aid in the diagnosis of diabetes mellitus.  Hemoglobin             Suggested A1C NGSP%              Diagnosis  <5.7                   Non Diabetic  5.7-6.4                Pre-Diabetic  >6.4                   Diabetic  <7.0                   Glycemic control for                       adults with diabetes.      Vital Signs: Temp:  [97.7 F (36.5 C)-98.2 F (36.8 C)] 98 F (36.7 C) (10/28 0756) Pulse Rate:  [76-102] 79  (10/28 0756) Cardiac Rhythm: Normal sinus rhythm (10/28 0700) Resp:  [16-22] 16 (10/28 0756) BP: (108-133)/(56-85) 118/67 (10/28 0756) SpO2:  [91 %-99 %] 91 % (10/28 0756) FiO2 (%):  [40 %] 40 % (10/27 2222) Weight:  [101 kg (222 lb 10.6 oz)] 101 kg (222 lb 10.6 oz) (10/28 0515)  Intake/Output Summary (Last 24 hours) at 04/01/2024 0803 Last data filed at 04/01/2024 0400 Gross per 24 hour  Intake 120 ml  Output 2150 ml  Net -2030 ml    Current Heart Failure Medications:  Loop diuretic: none Beta-Blocker: carvedilol  3.125 mg BID ACEI/ARB/ARNI: losartan 25 mg daily MRA: spironolactone 25 mg daily SGLT2i: none Other: none  Prior to admission Heart Failure Medications:  Loop diuretic: furosemide  40 mg daily Beta-Blocker: carvedilol  3.125 mg BID ACEI/ARB/ARNI: none MRA: none SGLT2i: none Other: none  Assessment: 1. Acute on  chronic diastolic heart failure (LVEF 50-55%)  , due to NICM. NYHA class III-IV symptoms.  -Symptoms: Reports feeling much better today. No notable LEE.  -Volume: Volume is difficult to assess due to body habitus. Creatinien/BUN/CO2 trending up with Cl trending down suggestive of developing contraction alkalosis. Remaining symptoms are more likely due to chronic respiratory failure as opposed to hypervolemia. Currently on furosemide  60 mg IV BID. Consider transition to oral diuretics today. -Hemodynamics: BP is stable. HR 80s. -BB: Currently on carvedilol  3.125 mg BID. Given COPD, patient may benefit from stopping or transitioning to metoprolol or bisoprolol instead. -ACEI/ARB/ARNI: Due to hypertension and modest benefit in HFpEF, consider transitioning to Entresto 24-26 mg BID when creatinine trends back down. -MRA: Continue spironolactone 25 mg daily -SGLT2i: Consider adding Farxiga or Jardiance to mg daily as first line therapy for HFpEF  Plan: 1) Medication changes recommended at this time: -Consider adding Farxiga or Jardiance 10 mg daily   -Consdier  transitioning to an equivalent dose of a beta-1 selective beta blocker (metoprolol or bisoprolol) given concomitant COPD with tenuous respiratory status.   2) Patient assistance: -Copays for Farxiga, Jardiance and Entresto are $0  3) Education: - Patient has been educated on current HF medications and potential additions to HF medication regimen - Patient verbalizes understanding that over the next few months, these medication doses may change and more medications may be added to optimize HF regimen - Patient has been educated on basic disease state pathophysiology and goals of therapy   Medication Assistance / Insurance Benefits Check: Does the patient have prescription insurance?    Please do not hesitate to reach out with questions or concerns,  Jaun Bash, PharmD, CPP, BCPS, [facility] Heart Failure Pharmacist  Phone - [phone] 04/01/2024 8:03 AM

## 2024-04-01 NOTE — TOC Progression Note (Addendum)
 Transition of Care [facility]) - Progression Note    Patient Details  Name: Miguel Howell MRN: [id number] Date of Birth: 09-12-1955  [facility] CM/SW Contact  Lauraine [name], LCSW Phone Number: 04/01/2024, 8:28 AM  Clinical Narrative:   Updated FL2 to reflect last night's bipap settings.  9:23 am: Sent SNF referral to facilities within [place] and surrounding counties.  10:47 am: Left voicemail for [facility] admissions coordinator.  12:59 pm: [facility] is unable to offer a bed. Patient and sister are aware. Sister would like to check [facility] in [place]. Left admissions coordinator a voicemail.  Expected Discharge Plan and Services                                               Social Drivers of Health (SDOH) Interventions SDOH Screenings   Food Insecurity: No Food Insecurity (03/27/2024)  Housing: Low Risk  (03/27/2024)  Transportation Needs: No Transportation Needs (03/27/2024)  Utilities: Not At Risk (03/27/2024)  Financial Resource Strain: Low Risk  (05/07/2023)   Received from [facility]  Social Connections: Unknown (03/27/2024)  Tobacco Use: High Risk (03/25/2024)    Readmission Risk Interventions     No data to display

## 2024-04-01 NOTE — Progress Notes (Signed)
 Occupational Therapy Treatment Patient Details Name: Miguel Howell MRN: [id number] DOB: [birth year] Today's Date: 04/01/2024   History of present illness Patient is a 68 year old male with worsening shortness of breath. PMH:  HTN, HLD, HFpEF, COPD   OT comments  Pt seen for OT tx. Pt in bed, agreeable to session. Pt reports being up EOB for ~72min earlier today and up once more to use the bathroom. Pt completed sup>sit EOB with supervision. MOD A to stand from std height bed with VC for hand placement. Pt required CGA for taking ~5 steps with RW. Set up with all needs in recliner. Pt denied SOB. SpO2 95% or greater on 3L. RN notified. Pt continues to benefit from skilled OT Services to maximize return to PLOF.       If plan is discharge home, recommend the following:  A little help with walking and/or transfers;A little help with bathing/dressing/bathroom;Help with stairs or ramp for entrance;Assist for transportation;Assistance with cooking/housework   Equipment Recommendations  None recommended by OT    Recommendations for Other Services      Precautions / Restrictions Precautions Precautions: Fall Recall of Precautions/Restrictions: Intact Restrictions Weight Bearing Restrictions Per Provider Order: No       Mobility Bed Mobility Overal bed mobility: Needs Assistance Bed Mobility: Supine to Sit     Supine to sit: Supervision, HOB elevated          Transfers Overall transfer level: Needs assistance Equipment used: Rolling walker (2 wheels) Transfers: Sit to/from Stand Sit to Stand: Mod assist           General transfer comment: MOD A to stand from std height bed, VC for hand placement     Balance Overall balance assessment: Needs assistance Sitting-balance support: Feet supported Sitting balance-Leahy Scale: Good     Standing balance support: Bilateral upper extremity supported Standing balance-Leahy Scale: Fair                              ADL either performed or assessed with clinical judgement   ADL                                              Extremity/Trunk Assessment              Vision       Perception     Praxis     Communication Communication Communication: Impaired Factors Affecting Communication: Reduced clarity of speech (low tone, essentially whispering)   Cognition Arousal: Alert Behavior During Therapy: Flat affect Cognition: No apparent impairments                               Following commands: Impaired Following commands impaired: Follows one step commands with increased time      Cueing   Cueing Techniques: Verbal cues  Exercises      Shoulder Instructions       General Comments      Pertinent Vitals/ Pain       Pain Assessment Pain Assessment: No/denies pain  Home Living  Prior Functioning/Environment              Frequency  Min 2X/week        Progress Toward Goals  OT Goals(current goals can now be found in the care plan section)  Progress towards OT goals: Progressing toward goals  Acute Rehab OT Goals Patient Stated Goal: improve strength OT Goal Formulation: With patient Time For Goal Achievement: 04/13/24 Potential to Achieve Goals: Good  Plan      Co-evaluation                 AM-PAC OT 6 Clicks Daily Activity     Outcome Measure   Help from another person eating meals?: None Help from another person taking care of personal grooming?: A Little Help from another person toileting, which includes using toliet, bedpan, or urinal?: A Little Help from another person bathing (including washing, rinsing, drying)?: A Lot Help from another person to put on and taking off regular upper body clothing?: A Little Help from another person to put on and taking off regular lower body clothing?: A Lot 6 Click Score: 17    End of Session Equipment  Utilized During Treatment: Oxygen;Rolling walker (2 wheels)  OT Visit Diagnosis: Other abnormalities of gait and mobility (R26.89);Muscle weakness (generalized) (M62.81)   Activity Tolerance Patient tolerated treatment well   Patient Left in chair;with call bell/phone within reach;with chair alarm set   Nurse Communication Mobility status;Other (comment) (O2 >95% on 3L)        Time: 1347-1400 OT Time Calculation (min): 13 min  Charges: OT General Charges $OT Visit: 1 Visit OT Treatments $Therapeutic Activity: 8-22 mins  Warren SAUNDERS., MPH, MS, OTR/L ascom [phone] 04/01/24, 2:13 PM

## 2024-04-02 DIAGNOSIS — J9622 Acute and chronic respiratory failure with hypercapnia: Secondary | ICD-10-CM | POA: Diagnosis not present

## 2024-04-02 DIAGNOSIS — J9621 Acute and chronic respiratory failure with hypoxia: Secondary | ICD-10-CM | POA: Diagnosis not present

## 2024-04-02 LAB — BASIC METABOLIC PANEL WITH GFR
Anion gap: 11 (ref 5–15)
BUN: 35 mg/dL — ABNORMAL HIGH (ref 8–23)
CO2: 38 mmol/L — ABNORMAL HIGH (ref 22–32)
Calcium: 9.6 mg/dL (ref 8.9–10.3)
Chloride: 90 mmol/L — ABNORMAL LOW (ref 98–111)
Creatinine, Ser: 1.01 mg/dL (ref 0.61–1.24)
GFR, Estimated: >60 mL/min (ref >60)
Glucose, Bld: 109 mg/dL — ABNORMAL HIGH (ref 70–99)
Potassium: 4.2 mmol/L (ref 3.5–5.1)
Sodium: 139 mmol/L (ref 135–145)

## 2024-04-02 LAB — CBC
HCT: 57.8 % — ABNORMAL HIGH (ref 39.0–52.0)
Hemoglobin: 17.7 g/dL — ABNORMAL HIGH (ref 13.0–17.0)
MCH: 34.8 pg — ABNORMAL HIGH (ref 26.0–34.0)
MCHC: 30.6 g/dL (ref 30.0–36.0)
MCV: 113.6 fL — ABNORMAL HIGH (ref 80.0–100.0)
Platelets: 70 K/uL — ABNORMAL LOW (ref 150–400)
RBC: 5.09 MIL/uL (ref 4.22–5.81)
RDW: 13.8 % (ref 11.5–15.5)
WBC: 5.1 K/uL (ref 4.0–10.5)
nRBC: 0 % (ref 0.0–0.2)

## 2024-04-02 NOTE — Plan of Care (Signed)

## 2024-04-02 NOTE — Progress Notes (Signed)
 Physical Therapy Treatment Patient Details Name: Miguel Howell MRN: [id number] DOB: 1956/05/02 Today's Date: 04/02/2024   History of Present Illness Patient is a 68 year old male with worsening shortness of breath. PMH:  HTN, HLD, HFpEF, COPD    PT Comments  Patient is agreeable to PT session. He reports feeling better overall today. He was able to walk a short distance with rolling walker with activity tolerance limited by generalized weakness and mild dizziness with activity. Continue to recommend PT to maximize independence and facilitate return to prior level of function.    If plan is discharge home, recommend the following: A little help with walking and/or transfers;A little help with bathing/dressing/bathroom;Assist for transportation;Help with stairs or ramp for entrance;Assistance with cooking/housework   Can travel by private vehicle     No  Equipment Recommendations  None recommended by PT    Recommendations for Other Services       Precautions / Restrictions Precautions Precautions: Fall Recall of Precautions/Restrictions: Intact Restrictions Weight Bearing Restrictions Per Provider Order: No     Mobility  Bed Mobility Overal bed mobility: Needs Assistance Bed Mobility: Supine to Sit     Supine to sit: Contact guard     General bed mobility comments: increased time required with occasional cues for sequencing    Transfers Overall transfer level: Needs assistance Equipment used: Rolling walker (2 wheels) Transfers: Sit to/from Stand Sit to Stand: Contact guard assist           General transfer comment: CGA for safety. cues for safety    Ambulation/Gait Ambulation/Gait assistance: Contact guard assist Gait Distance (Feet): 10 Feet Assistive device: Rolling walker (2 wheels) Gait Pattern/deviations: Decreased stride length, Step-to pattern, Narrow base of support Gait velocity: decreased     General Gait Details: no loss of balance with  walking but patient reports feeling generalized weakness and mild dizziness. he declined further ambulation at this time. encourage patient to continue using rolling walker for safety and fall prevention.   Stairs             Wheelchair Mobility     Tilt Bed    Modified Rankin (Stroke Patients Only)       Balance Overall balance assessment: Needs assistance Sitting-balance support: Feet supported Sitting balance-Leahy Scale: Good     Standing balance support: Bilateral upper extremity supported Standing balance-Leahy Scale: Fair                              Musician Communication: Impaired Factors Affecting Communication: Reduced clarity of speech  Cognition Arousal: Alert Behavior During Therapy: WFL for tasks assessed/performed                           PT - Cognition Comments: patient is more alert and interactive today. he reports feeling better overall Following commands: Impaired Following commands impaired: Follows one step commands with increased time    Cueing Cueing Techniques: Verbal cues  Exercises      General Comments General comments (skin integrity, edema, etc.): supportive sister at bedisde. she confirms that she has DME if needed      Pertinent Vitals/Pain Pain Assessment Pain Assessment: No/denies pain    Home Living                          Prior Function  PT Goals (current goals can now be found in the care plan section) Acute Rehab PT Goals Patient Stated Goal: to get better PT Goal Formulation: With patient/family Time For Goal Achievement: 04/10/24 Potential to Achieve Goals: Fair Progress towards PT goals: Progressing toward goals    Frequency    Min 2X/week      PT Plan      Co-evaluation              AM-PAC PT 6 Clicks Mobility   Outcome Measure  Help needed turning from your back to your side while in a flat bed without using bedrails?:  A Little Help needed moving from lying on your back to sitting on the side of a flat bed without using bedrails?: A Lot Help needed moving to and from a bed to a chair (including a wheelchair)?: A Little Help needed standing up from a chair using your arms (e.g., wheelchair or bedside chair)?: A Little Help needed to walk in hospital room?: A Little Help needed climbing 3-5 steps with a railing? : A Lot 6 Click Score: 16    End of Session   Activity Tolerance: Patient limited by fatigue Patient left: in chair;with call bell/phone within reach;with nursing/sitter in room;with family/visitor present Nurse Communication: Mobility status PT Visit Diagnosis: Muscle weakness (generalized) (M62.81);Unsteadiness on feet (R26.81)     Time: 8889-8872 PT Time Calculation (min) (ACUTE ONLY): 17 min  Charges:    $Therapeutic Activity: 8-22 mins PT General Charges $$ ACUTE PT VISIT: 1 Visit                     Miguel Howell, PT, MPT    Miguel Howell 04/02/2024, 12:29 PM

## 2024-04-02 NOTE — Progress Notes (Addendum)
 PROGRESS NOTE    Miguel Howell  FMW:[id number] DOB: [birth year] DOA: 03/25/2024 PCP: Auston Reyes BIRCH, MD    Assessment & Plan:   Principal Problem:   Acute on chronic respiratory failure with hypoxia and hypercapnia (HCC) Active Problems:   Acute on chronic diastolic CHF (congestive heart failure) (HCC)   COPD exacerbation (HCC)   Hypokalemia   Lobar pneumonia   HTN (hypertension)   Acute kidney injury superimposed on CKD   Obesity (BMI 30-39.9)   Erythrocytosis   Thrombocytopenia   Acute urinary retention   Hypoxia   Thrush  Assessment and Plan: Acute hypoxic & hypercapnic respiratory failure: continue on supplemental oxygen and wean as tolerated. BiPAP prn. Does not use oxygen at home.    Acute on chronic diastolic CHF: with anasarca.  EF 50 to 55%. Continue on torsemide, aldactone, losartan, metoprolol, jardiance. Monitor I/Os.     COPD exacerbation: continue on steroids, bronchodilators.    Lobar pneumonia: completed abx course. Continue on steroids, bronchodilators   Hypokalemia: WNL today    HTN: continue on metoprolol, aldactone, losartan   AKI on CKDIIIa: Cr is trending down from day prior. Avoid nephrotoxic meds    Obesity: BMI 33.6. Would benefit from weight loss   Thrush: improved. Continue on po fluconazole    Acute urinary retention: continue on flomax   Thrombocytopenia: labile. Will continue to monitor    Erythrocytosis: likely secondary to chronic hypoxia.  Pending JAK2.  Erythropoietin level actually low.        DVT prophylaxis: SCDs Code Status: DNR Family Communication: discussed pt's care w/ pt's sister at bedside and answered her questions Disposition Plan: likely d/c to SNF  Level of care: Telemetry Cardiac Status is: Inpatient Remains inpatient appropriate because: severity of illness    Consultants:  Palliative care  Procedures:   Antimicrobials:    Subjective: Pt c/o shortness of breath, slightly better than  yesterday   Objective: Vitals:   04/02/24 0353 04/02/24 0500 04/02/24 0737 04/02/24 0953  BP: 124/66   116/70  Pulse: 73  81 82  Resp: 20     Temp: 97.8 F (36.6 C)   97.9 F (36.6 C)  TempSrc:    Oral  SpO2: 99%   96%  Weight:  103.3 kg    Height:        Intake/Output Summary (Last 24 hours) at 04/02/2024 1008 Last data filed at 04/02/2024 0500 Gross per 24 hour  Intake 1200 ml  Output 1300 ml  Net -100 ml   Filed Weights   03/31/24 0513 04/01/24 0515 04/02/24 0500  Weight: 102.8 kg 101 kg 103.3 kg    Examination:  General exam: Appears calm and comfortable  Respiratory system: diminished breath sounds b/l  Cardiovascular system: S1 & S2 +. No rubs, gallops or clicks.  Gastrointestinal system: Abdomen is nondistended, soft and nontender.  Normal bowel sounds heard. Central nervous system: Alert and oriented. Moves all extremities Psychiatry: Judgement and insight appear normal.Flat mood and affect    Data Reviewed: I have personally reviewed following labs and imaging studies  CBC: Recent Labs  Lab 03/28/24 0545 03/29/24 0829 03/30/24 0449 04/01/24 0427 04/02/24 0412  WBC 8.4 5.0 5.5 5.0 5.1  HGB 17.8* 17.3* 17.5* 18.4* 17.7*  HCT 58.0* 54.4* 54.8* 58.8* 57.8*  MCV 114.4* 111.0* 110.3* 113.3* 113.6*  PLT 117* 88* 95* 75* 70*   Basic Metabolic Panel: Recent Labs  Lab 03/29/24 0537 03/30/24 0449 03/31/24 0429 04/01/24 0427 04/02/24 0412  NA 131*  136 135 141 139  K 3.8 4.3 4.6 4.6 4.2  CL 86* 90* 89* 92* 90*  CO2 35* 36* 36* 39* 38*  GLUCOSE 161* 154* 213* 171* 109*  BUN 31* 32* 38* 41* 35*  CREATININE 0.88 0.91 0.86 1.15 1.01  CALCIUM  9.7 9.6 9.6 9.8 9.6   GFR: Estimated Creatinine Clearance: 85.2 mL/min (by C-G formula based on SCr of 1.01 mg/dL). Liver Function Tests: No results for input(s): AST, ALT, ALKPHOS, BILITOT, PROT, ALBUMIN in the last 168 hours. No results for input(s): LIPASE, AMYLASE in the last 168 hours. No  results for input(s): AMMONIA in the last 168 hours. Coagulation Profile: No results for input(s): INR, PROTIME in the last 168 hours. Cardiac Enzymes: No results for input(s): CKTOTAL, CKMB, CKMBINDEX, TROPONINI in the last 168 hours. BNP (last 3 results) No results for input(s): PROBNP in the last 8760 hours. HbA1C: No results for input(s): HGBA1C in the last 72 hours. CBG: Recent Labs  Lab 03/28/24 1739  GLUCAP 174*   Lipid Profile: No results for input(s): CHOL, HDL, LDLCALC, TRIG, CHOLHDL, LDLDIRECT in the last 72 hours. Thyroid  Function Tests: No results for input(s): TSH, T4TOTAL, FREET4, T3FREE, THYROIDAB in the last 72 hours. Anemia Panel: No results for input(s): VITAMINB12, FOLATE, FERRITIN, TIBC, IRON, RETICCTPCT in the last 72 hours. Sepsis Labs: No results for input(s): PROCALCITON, LATICACIDVEN in the last 168 hours.  Recent Results (from the past 240 hours)  Expectorated Sputum Assessment w Gram Stain, Rflx to Resp Cult     Status: None   Collection Time: 03/28/24  9:56 AM   Specimen: Sputum  Result Value Ref Range Status   Specimen Description SPUTUM  Final   Special Requests Normal  Final   Sputum evaluation   Final    THIS SPECIMEN IS ACCEPTABLE FOR SPUTUM CULTURE Performed at [facility], [address], [place], [place] [address]    Report Status 03/28/2024 FINAL  Final  Culture, Respiratory w Gram Stain     Status: None   Collection Time: 03/28/24  9:56 AM   Specimen: SPU  Result Value Ref Range Status   Specimen Description   Final    SPUTUM Performed at [facility], [address], [place], [place] [address]    Special Requests   Final    Normal Reflexed from [id number] Performed at [facility], [address] [place], [place] [address]    Gram Stain   Final    ABUNDANT SQUAMOUS EPITHELIAL CELLS PRESENT FEW WBC PRESENT, PREDOMINANTLY PMN ABUNDANT GRAM  NEGATIVE RODS ABUNDANT GRAM POSITIVE COCCI IN PAIRS RARE YEAST WITH PSEUDOHYPHAE Performed at [facility], [address] [place], [place] [address]    Culture ABUNDANT SERRATIA MARCESCENS  Final   Report Status 03/30/2024 FINAL  Final   Organism ID, Bacteria SERRATIA MARCESCENS  Final      Susceptibility   Serratia marcescens - MIC*    CEFEPIME  <=0.12 SENSITIVE Sensitive     ERTAPENEM <=0.12 SENSITIVE Sensitive     CEFTRIAXONE  <=0.25 SENSITIVE Sensitive     CIPROFLOXACIN  0.12 SENSITIVE Sensitive     GENTAMICIN <=1 SENSITIVE Sensitive     MEROPENEM <=0.25 SENSITIVE Sensitive     TRIMETH/SULFA <=20 SENSITIVE Sensitive     * ABUNDANT SERRATIA MARCESCENS         Radiology Studies: No results found.      Scheduled Meds:  amitriptyline  25 mg Oral QHS   aspirin  EC  81 mg Oral Daily  budesonide  (PULMICORT ) nebulizer solution  0.5 mg Nebulization BID   dapagliflozin propanediol  10 mg Oral Daily   docusate sodium   100 mg Oral BID   fluconazole  100 mg Oral Daily   fluticasone  furoate-vilanterol  1 puff Inhalation Daily   folic acid   1 mg Oral Daily   ipratropium-albuterol   3 mL Nebulization BID   losartan  25 mg Oral Daily   methylPREDNISolone  (SOLU-MEDROL ) injection  60 mg Intravenous Daily   metoprolol succinate  25 mg Oral Daily   montelukast   10 mg Oral Daily   nicotine   14 mg Transdermal Daily   pantoprazole  40 mg Oral Daily   potassium chloride   10 mEq Oral Daily   spironolactone  25 mg Oral Daily   tamsulosin  0.4 mg Oral Daily   torsemide  40 mg Oral Daily   venlafaxine XR  75 mg Oral Q breakfast   Continuous Infusions:  cefTRIAXone  (ROCEPHIN )  IV 2 g (04/01/24 1113)     LOS: 8 days     Anthony CHRISTELLA Pouch, MD [facility] Pager [phone]  If 7PM-7AM, please contact night-coverage [email or web address] 04/02/2024, 10:08 AM

## 2024-04-02 NOTE — Progress Notes (Signed)
 Occupational Therapy Treatment Patient Details Name: Miguel Howell MRN: [id number] DOB: 1955-07-21 Today's Date: 04/02/2024   History of present illness Patient is a 68 year old male with worsening shortness of breath. PMH:  HTN, HLD, HFpEF, COPD   OT comments  Pt seen for OT treatment this date, received in room with NT and sister present. Pt completes grooming tasks with setup recliner level (oral care, applies deodorant and washes face) and requires up to MIN A for functional STS transfers x2. Requires cues for hand placement and to scoot hips forward prior to transitioning upright, recovery time seated for 2 mins before second trial. Pt continues to present with decreased activity tolerance and generalized weakness, discharge recommendation appropriate.       If plan is discharge home, recommend the following:  A little help with walking and/or transfers;A little help with bathing/dressing/bathroom;Help with stairs or ramp for entrance;Assist for transportation;Assistance with cooking/housework   Equipment Recommendations  None recommended by OT       Precautions / Restrictions Precautions Precautions: Fall Recall of Precautions/Restrictions: Intact Restrictions Weight Bearing Restrictions Per Provider Order: No       Mobility Bed Mobility Overal bed mobility: Needs Assistance             General bed mobility comments: NT, pt recieved upright in chair    Transfers Overall transfer level: Needs assistance Equipment used: Rolling walker (2 wheels) Transfers: Sit to/from Stand Sit to Stand: Min assist           General transfer comment: MIN A to rise from recliner 2x STS using RW, cues for hand placement     Balance Overall balance assessment: Needs assistance Sitting-balance support: Feet supported Sitting balance-Leahy Scale: Good     Standing balance support: Bilateral upper extremity supported Standing balance-Leahy Scale: Fair Standing balance  comment: UE support on RW                           ADL either performed or assessed with clinical judgement   ADL Overall ADL's : Needs assistance/impaired     Grooming: Sitting;Wash/dry face;Wash/dry hands;Oral care;Set up Grooming Details (indicate cue type and reason): sitting recliner level                             Functional mobility during ADLs: Minimal assistance;Rolling walker (2 wheels)       Communication Communication Communication: Impaired Factors Affecting Communication: Reduced clarity of speech (low vocal volume)   Cognition Arousal: Alert Behavior During Therapy: WFL for tasks assessed/performed Cognition: No apparent impairments                               Following commands: Impaired Following commands impaired: Follows one step commands with increased time      Cueing   Cueing Techniques: Verbal cues        General Comments VSS on 2l    Pertinent Vitals/ Pain       Pain Assessment Pain Assessment: No/denies pain         Frequency  Min 2X/week        Progress Toward Goals  OT Goals(current goals can now be found in the care plan section)  Progress towards OT goals: Progressing toward goals  Acute Rehab OT Goals OT Goal Formulation: With patient Time For Goal Achievement: 04/13/24 Potential to  Achieve Goals: Good ADL Goals Pt Will Perform Lower Body Dressing: with supervision;sitting/lateral leans;sit to/from stand Pt Will Transfer to Toilet: with supervision;ambulating Additional ADL Goal #1: Pt will demo implementation of 1 learned ECS during ADL performance 2/2 trials to maximize IND/safety and prevent overexertion.  Plan         AM-PAC OT 6 Clicks Daily Activity     Outcome Measure   Help from another person eating meals?: None Help from another person taking care of personal grooming?: A Little Help from another person toileting, which includes using toliet, bedpan, or urinal?: A  Little Help from another person bathing (including washing, rinsing, drying)?: A Lot Help from another person to put on and taking off regular upper body clothing?: A Little Help from another person to put on and taking off regular lower body clothing?: A Lot 6 Click Score: 17    End of Session Equipment Utilized During Treatment: Oxygen;Rolling walker (2 wheels)  OT Visit Diagnosis: Other abnormalities of gait and mobility (R26.89);Muscle weakness (generalized) (M62.81)   Activity Tolerance Patient tolerated treatment well   Patient Left in chair;with call bell/phone within reach;with chair alarm set   Nurse Communication Mobility status        Time: 8858-8795 OT Time Calculation (min): 23 min  Charges: OT General Charges $OT Visit: 1 Visit OT Treatments $Self Care/Home Management : 23-37 mins Nyla Creason L. Lorriann Hansmann, OTR/L  04/02/24, 12:39 PM

## 2024-04-02 NOTE — Plan of Care (Signed)
  Problem: Clinical Measurements: Goal: Ability to maintain clinical measurements within normal limits will improve Outcome: Progressing   Problem: Clinical Measurements: Goal: Respiratory complications will improve Outcome: Progressing   Problem: Activity: Goal: Risk for activity intolerance will decrease Outcome: Progressing   Problem: Pain Managment: Goal: General experience of comfort will improve and/or be controlled Outcome: Progressing   Problem: Safety: Goal: Ability to remain free from injury will improve Outcome: Progressing

## 2024-04-02 NOTE — Progress Notes (Signed)
 Heart Failure Stewardship Pharmacy Note  PCP: Auston Reyes BIRCH, MD PCP-Cardiologist: None  HPI: Miguel Howell is a 68 y.o. male with HTN, HLD, HFpEF, COPD who presented with progressive shortness of breath over 3-4 weeks with sputum production. On admission, BNP was 1280.7, HS-troponin was 79, and A1c 6.4. Chest x-ray noted no active cardiopulmonary disease.   Pertinent cardiac history: TTE 06/2016 showed LVEF of 65-70% with G2DD. TTE 03/2024 noted LVEF 50-55%, mild LVH, and indeterminate diastolic dysfunction.  Pertinent Lab Values: Creatinine, Ser  Date Value Ref Range Status  04/02/2024 1.01 0.61 - 1.24 mg/dL Final   BUN  Date Value Ref Range Status  04/02/2024 35 (H) 8 - 23 mg/dL Final   Potassium  Date Value Ref Range Status  04/02/2024 4.2 3.5 - 5.1 mmol/L Final   Sodium  Date Value Ref Range Status  04/02/2024 139 135 - 145 mmol/L Final   B Natriuretic Peptide  Date Value Ref Range Status  03/25/2024 1,280.7 (H) 0.0 - 100.0 pg/mL Final    Comment:    Performed at [facility], [address] [place], [place] [address]   Magnesium   Date Value Ref Range Status  03/26/2024 2.1 1.7 - 2.4 mg/dL Final    Comment:    Performed at [facility], [address] [place], [place] [address]   Hgb A1c MFr Bld  Date Value Ref Range Status  03/26/2024 6.4 (H) 4.8 - 5.6 % Final    Comment:    (NOTE) Diagnosis of Diabetes The following HbA1c ranges recommended by the American Diabetes Association (ADA) may be used as an aid in the diagnosis of diabetes mellitus.  Hemoglobin             Suggested A1C NGSP%              Diagnosis  <5.7                   Non Diabetic  5.7-6.4                Pre-Diabetic  >6.4                   Diabetic  <7.0                   Glycemic control for                       adults with diabetes.      Vital Signs: Temp:  [97.7 F (36.5 C)-98.7 F (37.1 C)] 97.8 F (36.6 C) (10/29 0353) Pulse Rate:  [73-86] 73  (10/29 0353) Cardiac Rhythm: Normal sinus rhythm (10/29 0700) Resp:  [16-20] 20 (10/29 0353) BP: (118-131)/(62-73) 124/66 (10/29 0353) SpO2:  [91 %-100 %] 99 % (10/29 0353) FiO2 (%):  [30 %] 30 % (10/28 2258) Weight:  [103.3 kg (227 lb 11.8 oz)] 103.3 kg (227 lb 11.8 oz) (10/29 0500)  Intake/Output Summary (Last 24 hours) at 04/02/2024 0730 Last data filed at 04/02/2024 0500 Gross per 24 hour  Intake 1200 ml  Output 1700 ml  Net -500 ml    Current Heart Failure Medications:  Loop diuretic: torsemide 40 mg daily Beta-Blocker: metoprolol succinate 25 mg daily ACEI/ARB/ARNI: losartan 25 mg daily MRA: spironolactone 25 mg daily SGLT2i: Farxiga 10 mg daily Other: none  Prior to admission Heart Failure Medications:  Loop diuretic: furosemide  40 mg daily Beta-Blocker: carvedilol  3.125 mg BID ACEI/ARB/ARNI: none MRA: none SGLT2i: none  Other: none  Assessment: 1. 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 NYHA class III-IV symptoms.  -Symptoms: Reports feeling well today. No notable LEE.  -Volume: Appears euvolemic. Creatinine/BUN are trending back down after transition to torsemide 40 mg daily. Continue current dose. -Hemodynamics: BP is stable. HR 80s. -BB: Continue metoprolol succinate 25 mg daily -ACEI/ARB/ARNI: Due to hypertension and modest benefit in HFpEF, consider transitioning to Entresto 24-26 mg BID. -MRA: Continue spironolactone 25 mg daily -SGLT2i: Continie Farxiga 10 mg daily    Plan: 1) Medication changes recommended at this time: -None  2) Patient assistance: -Copays for Farxiga, Jardiance and Entresto are $0  3) Education: - Patient has been educated on current HF medications and potential additions to HF medication regimen - Patient verbalizes understanding that over the next few months, these medication doses may change and more medications may be added to optimize HF regimen - Patient has been educated on basic disease state  pathophysiology and goals of therapy   Medication Assistance / Insurance Benefits Check: Does the patient have prescription insurance?    Please do not hesitate to reach out with questions or concerns,  Jaun Bash, PharmD, CPP, BCPS, [facility] Heart Failure Pharmacist  Phone - [phone] 04/02/2024 7:30 AM

## 2024-04-02 NOTE — TOC Progression Note (Addendum)
 Transition of Care [facility]) - Progression Note    Patient Details  Name: HANAN MOEN MRN: [id number] Date of Birth: 10-15-1955  Transition of Care [facility]) CM/SW Contact  Lauraine [name], LCSW Phone Number: 04/02/2024, 10:15 AM  Clinical Narrative:  Left another voicemail for [facility] admissions coordinator.   11:25 am: Gave local bed offers to patient and sister. Encouraged them to pick out a preference in case [facility] cannot offer.  Expected Discharge Plan and Services                                               Social Drivers of Health (SDOH) Interventions SDOH Screenings   Food Insecurity: No Food Insecurity (03/27/2024)  Housing: Low Risk  (03/27/2024)  Transportation Needs: No Transportation Needs (03/27/2024)  Utilities: Not At Risk (03/27/2024)  Financial Resource Strain: Low Risk  (05/07/2023)   Received from [facility]  Social Connections: Unknown (03/27/2024)  Tobacco Use: High Risk (03/25/2024)    Readmission Risk Interventions     No data to display

## 2024-04-03 DIAGNOSIS — J9621 Acute and chronic respiratory failure with hypoxia: Secondary | ICD-10-CM | POA: Diagnosis not present

## 2024-04-03 DIAGNOSIS — J9622 Acute and chronic respiratory failure with hypercapnia: Secondary | ICD-10-CM | POA: Diagnosis not present

## 2024-04-03 LAB — CBC
HCT: 56.6 % — ABNORMAL HIGH (ref 39.0–52.0)
Hemoglobin: 18 g/dL — ABNORMAL HIGH (ref 13.0–17.0)
MCH: 35.9 pg — ABNORMAL HIGH (ref 26.0–34.0)
MCHC: 31.8 g/dL (ref 30.0–36.0)
MCV: 113 fL — ABNORMAL HIGH (ref 80.0–100.0)
Platelets: 66 K/uL — ABNORMAL LOW (ref 150–400)
RBC: 5.01 MIL/uL (ref 4.22–5.81)
RDW: 13.6 % (ref 11.5–15.5)
WBC: 5.3 K/uL (ref 4.0–10.5)
nRBC: 0 % (ref 0.0–0.2)

## 2024-04-03 LAB — BASIC METABOLIC PANEL WITH GFR
Anion gap: 12 (ref 5–15)
BUN: 28 mg/dL — ABNORMAL HIGH (ref 8–23)
CO2: 36 mmol/L — ABNORMAL HIGH (ref 22–32)
Calcium: 9.3 mg/dL (ref 8.9–10.3)
Chloride: 91 mmol/L — ABNORMAL LOW (ref 98–111)
Creatinine, Ser: 0.84 mg/dL (ref 0.61–1.24)
GFR, Estimated: >60 mL/min (ref >60)
Glucose, Bld: 108 mg/dL — ABNORMAL HIGH (ref 70–99)
Potassium: 4.3 mmol/L (ref 3.5–5.1)
Sodium: 139 mmol/L (ref 135–145)

## 2024-04-03 MED ORDER — ENOXAPARIN SODIUM 60 MG/0.6ML IJ SOSY
0.5000 mg/kg | PREFILLED_SYRINGE | INTRAMUSCULAR | Status: DC
  Administered 2024-04-03 – 2024-04-07 (×5): 50 mg via SUBCUTANEOUS
  Filled 2024-04-03 (×5): qty 0.6

## 2024-04-03 NOTE — Progress Notes (Addendum)
 Pt wore bipap from 2230 to 0100 over night before requesting to come off bipap. Pt educated by this nurse and RT on the importance of wearing the bipap all night long. Pt verbalized understanding but still insists on taking bipap off and going back on the nasal cannula at this time.

## 2024-04-03 NOTE — Progress Notes (Signed)
 Heart Failure Stewardship Pharmacy Note  PCP: Auston Reyes BIRCH, MD PCP-Cardiologist: None  HPI: Miguel Howell is a 68 y.o. male with HTN, HLD, HFpEF, COPD who presented with progressive shortness of breath over 3-4 weeks with sputum production. On admission, BNP was 1280.7, HS-troponin was 79, and A1c 6.4. Chest x-ray noted no active cardiopulmonary disease.   Pertinent cardiac history: TTE 06/2016 showed LVEF of 65-70% with G2DD. TTE 03/2024 noted LVEF 50-55%, mild LVH, and indeterminate diastolic dysfunction.  Pertinent Lab Values: Creatinine, Ser  Date Value Ref Range Status  04/03/2024 0.84 0.61 - 1.24 mg/dL Final   BUN  Date Value Ref Range Status  04/03/2024 28 (H) 8 - 23 mg/dL Final   Potassium  Date Value Ref Range Status  04/03/2024 4.3 3.5 - 5.1 mmol/L Final   Sodium  Date Value Ref Range Status  04/03/2024 139 135 - 145 mmol/L Final   B Natriuretic Peptide  Date Value Ref Range Status  03/25/2024 1,280.7 (H) 0.0 - 100.0 pg/mL Final    Comment:    Performed at [facility], [address] [place], [place] [address]   Magnesium   Date Value Ref Range Status  03/26/2024 2.1 1.7 - 2.4 mg/dL Final    Comment:    Performed at [facility], [address] [place], [place] [address]   Hgb A1c MFr Bld  Date Value Ref Range Status  03/26/2024 6.4 (H) 4.8 - 5.6 % Final    Comment:    (NOTE) Diagnosis of Diabetes The following HbA1c ranges recommended by the American Diabetes Association (ADA) may be used as an aid in the diagnosis of diabetes mellitus.  Hemoglobin             Suggested A1C NGSP%              Diagnosis  <5.7                   Non Diabetic  5.7-6.4                Pre-Diabetic  >6.4                   Diabetic  <7.0                   Glycemic control for                       adults with diabetes.      Vital Signs: Temp:  [97.9 F (36.6 C)-98.3 F (36.8 C)] 98.2 F (36.8 C) (10/30 0501) Pulse Rate:  [75-83] 75  (10/30 0501) Cardiac Rhythm: Normal sinus rhythm (10/29 1931) Resp:  [16-20] 18 (10/30 0501) BP: (107-131)/(63-70) 124/67 (10/30 0501) SpO2:  [96 %-98 %] 96 % (10/30 0501) FiO2 (%):  [30 %] 30 % (10/29 2233) Weight:  [101.3 kg (223 lb 5.2 oz)] 101.3 kg (223 lb 5.2 oz) (10/30 0501)  Intake/Output Summary (Last 24 hours) at 04/03/2024 0733 Last data filed at 04/03/2024 0500 Gross per 24 hour  Intake 0 ml  Output 2300 ml  Net -2300 ml    Current Heart Failure Medications:  Loop diuretic: torsemide 40 mg daily Beta-Blocker: metoprolol succinate 25 mg daily ACEI/ARB/ARNI: losartan 25 mg daily MRA: spironolactone 25 mg daily SGLT2i: Farxiga 10 mg daily Other: none  Prior to admission Heart Failure Medications:  Loop diuretic: furosemide  40 mg daily Beta-Blocker: carvedilol  3.125 mg BID ACEI/ARB/ARNI: none MRA: none SGLT2i: none  Other: none  Assessment: 1. 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 NYHA class III-IV symptoms.  -Symptoms: Reports feeling well today. No notable LEE.  -Volume: Appears euvolemic. Creatinine/BUN are trending back down after transition to torsemide 40 mg daily. Continue current dose. -Hemodynamics: BP is stable. HR 80s. -BB: Continue metoprolol succinate 25 mg daily -ACEI/ARB/ARNI: Due to hypertension and modest benefit in HFpEF, consider transitioning to Entresto 24-26 mg BID. -MRA: Continue spironolactone 25 mg daily -SGLT2i: Continie Farxiga 10 mg daily    Plan: 1) Medication changes recommended at this time: -None. Given patient awaiting placement, will sign off. Please reach out with any medication management needs.  2) Patient assistance: -Copays for Farxiga, Jardiance and Entresto are $0  3) Education: - Patient has been educated on current HF medications and potential additions to HF medication regimen - Patient verbalizes understanding that over the next few months, these medication doses may change and more  medications may be added to optimize HF regimen - Patient has been educated on basic disease state pathophysiology and goals of therapy   Medication Assistance / Insurance Benefits Check: Does the patient have prescription insurance?    Please do not hesitate to reach out with questions or concerns,  Jaun Bash, PharmD, CPP, BCPS, [facility] Heart Failure Pharmacist  Phone - [phone] 04/03/2024 7:33 AM

## 2024-04-03 NOTE — Progress Notes (Signed)
 PROGRESS NOTE    Miguel Howell  FMW:[id number] DOB: 1955-11-30 DOA: 03/25/2024 PCP: Auston Reyes BIRCH, MD    Assessment & Plan:   Principal Problem:   Acute on chronic respiratory failure with hypoxia and hypercapnia (HCC) Active Problems:   Acute on chronic diastolic CHF (congestive heart failure) (HCC)   COPD exacerbation (HCC)   Hypokalemia   Lobar pneumonia   HTN (hypertension)   Acute kidney injury superimposed on CKD   Obesity (BMI 30-39.9)   Erythrocytosis   Thrombocytopenia   Acute urinary retention   Hypoxia   Thrush  Assessment and Plan: Acute hypoxic & hypercapnic respiratory failure: continue on supplemental oxygen and wean as tolerated. BiPAP prn. Does not use oxygen at home.    Acute on chronic diastolic CHF: with anasarca.  EF 50 to 55%.  Continue on torsemide, metoprolol, aldactone, losartan, jardiance. Monitor I/Os   COPD exacerbation: continue on steroids, bronchodilators & encourage incentive spirometry. Intermittent wheezes b/l today    Lobar pneumonia: completed abx course. Continue on steroids, bronchodilators    Hypokalemia: WNL today    HTN: continue on losartan, metoprolol, aldactone   AKI on CKDIIIa: Cr is down again today. Avoid nephrotoxic meds    Obesity: BMI 33.6. Would benefit from weight loss   Thrush: improved. Continue on po fluconazole    Acute urinary retention: continue on flomax   Thrombocytopenia: labile. Will continue to monitor    Erythrocytosis: likely secondary to chronic hypoxia. JAK2 is pending still.  Erythropoietin level actually low.        DVT prophylaxis: SCDs, lovenox  Code Status: DNR Family Communication: discussed pt's care w/ pt's sister at bedside and answered her questions Disposition Plan: likely d/c to SNF  Level of care: Telemetry Cardiac Status is: Inpatient Remains inpatient appropriate because: severity of illness, waiting on SNF placement, CM is working on this     Consultants:   Palliative care  Procedures:   Antimicrobials:    Subjective: Pt c/o malaise & shortness of breath   Objective: Vitals:   04/02/24 2335 04/02/24 2336 04/03/24 0501 04/03/24 0821  BP: 127/69  124/67 114/61  Pulse: 81  75 84  Resp: 20 20 18    Temp: 98.1 F (36.7 C)  98.2 F (36.8 C) 98.6 F (37 C)  TempSrc:      SpO2: 97%  96% 93%  Weight:   101.3 kg   Height:        Intake/Output Summary (Last 24 hours) at 04/03/2024 0827 Last data filed at 04/03/2024 0500 Gross per 24 hour  Intake 0 ml  Output 2300 ml  Net -2300 ml   Filed Weights   04/01/24 0515 04/02/24 0500 04/03/24 0501  Weight: 101 kg 103.3 kg 101.3 kg    Examination:  General exam: appears comfortable Respiratory system: decreased breath sounds b/l. Intermittent wheezes b/l  Cardiovascular system: S1/S2+. No rubs or gallops  Gastrointestinal system: abd is soft, NT, obese & hypoactive bowel sounds Central nervous system: alert & oriented. Moves all extremities Psychiatry: Judgement and insight appears at baseline. Flat mood and affect     Data Reviewed: I have personally reviewed following labs and imaging studies  CBC: Recent Labs  Lab 03/29/24 0829 03/30/24 0449 04/01/24 0427 04/02/24 0412 04/03/24 0440  WBC 5.0 5.5 5.0 5.1 5.3  HGB 17.3* 17.5* 18.4* 17.7* 18.0*  HCT 54.4* 54.8* 58.8* 57.8* 56.6*  MCV 111.0* 110.3* 113.3* 113.6* 113.0*  PLT 88* 95* 75* 70* 66*   Basic Metabolic Panel: Recent  Labs  Lab 03/30/24 0449 03/31/24 0429 04/01/24 0427 04/02/24 0412 04/03/24 0440  NA 136 135 141 139 139  K 4.3 4.6 4.6 4.2 4.3  CL 90* 89* 92* 90* 91*  CO2 36* 36* 39* 38* 36*  GLUCOSE 154* 213* 171* 109* 108*  BUN 32* 38* 41* 35* 28*  CREATININE 0.91 0.86 1.15 1.01 0.84  CALCIUM  9.6 9.6 9.8 9.6 9.3   GFR: Estimated Creatinine Clearance: 101.4 mL/min (by C-G formula based on SCr of 0.84 mg/dL). Liver Function Tests: No results for input(s): AST, ALT, ALKPHOS, BILITOT, PROT,  ALBUMIN in the last 168 hours. No results for input(s): LIPASE, AMYLASE in the last 168 hours. No results for input(s): AMMONIA in the last 168 hours. Coagulation Profile: No results for input(s): INR, PROTIME in the last 168 hours. Cardiac Enzymes: No results for input(s): CKTOTAL, CKMB, CKMBINDEX, TROPONINI in the last 168 hours. BNP (last 3 results) No results for input(s): PROBNP in the last 8760 hours. HbA1C: No results for input(s): HGBA1C in the last 72 hours. CBG: Recent Labs  Lab 03/28/24 1739  GLUCAP 174*   Lipid Profile: No results for input(s): CHOL, HDL, LDLCALC, TRIG, CHOLHDL, LDLDIRECT in the last 72 hours. Thyroid  Function Tests: No results for input(s): TSH, T4TOTAL, FREET4, T3FREE, THYROIDAB in the last 72 hours. Anemia Panel: No results for input(s): VITAMINB12, FOLATE, FERRITIN, TIBC, IRON, RETICCTPCT in the last 72 hours. Sepsis Labs: No results for input(s): PROCALCITON, LATICACIDVEN in the last 168 hours.  Recent Results (from the past 240 hours)  Expectorated Sputum Assessment w Gram Stain, Rflx to Resp Cult     Status: None   Collection Time: 03/28/24  9:56 AM   Specimen: Sputum  Result Value Ref Range Status   Specimen Description SPUTUM  Final   Special Requests Normal  Final   Sputum evaluation   Final    THIS SPECIMEN IS ACCEPTABLE FOR SPUTUM CULTURE Performed at [facility], [address] [place], [place] [address]    Report Status 03/28/2024 FINAL  Final  Culture, Respiratory w Gram Stain     Status: None   Collection Time: 03/28/24  9:56 AM   Specimen: SPU  Result Value Ref Range Status   Specimen Description   Final    SPUTUM Performed at [facility], [address], [place], [place] [address]    Special Requests   Final    Normal Reflexed from [id number] Performed at [facility], [address] [place], [place] [address]    Gram Stain    Final    ABUNDANT SQUAMOUS EPITHELIAL CELLS PRESENT FEW WBC PRESENT, PREDOMINANTLY PMN ABUNDANT GRAM NEGATIVE RODS ABUNDANT GRAM POSITIVE COCCI IN PAIRS RARE YEAST WITH PSEUDOHYPHAE Performed at [facility], [address], [place], [place] [address]    Culture ABUNDANT SERRATIA MARCESCENS  Final   Report Status 03/30/2024 FINAL  Final   Organism ID, Bacteria SERRATIA MARCESCENS  Final      Susceptibility   Serratia marcescens - MIC*    CEFEPIME  <=0.12 SENSITIVE Sensitive     ERTAPENEM <=0.12 SENSITIVE Sensitive     CEFTRIAXONE  <=0.25 SENSITIVE Sensitive     CIPROFLOXACIN  0.12 SENSITIVE Sensitive     GENTAMICIN <=1 SENSITIVE Sensitive     MEROPENEM <=0.25 SENSITIVE Sensitive     TRIMETH/SULFA <=20 SENSITIVE Sensitive     * ABUNDANT SERRATIA MARCESCENS         Radiology Studies: No results found.      Scheduled Meds:  amitriptyline  25 mg Oral QHS   aspirin  EC  81 mg Oral Daily   budesonide  (PULMICORT ) nebulizer solution  0.5 mg Nebulization BID   dapagliflozin propanediol  10 mg Oral Daily   docusate sodium   100 mg Oral BID   fluconazole  100 mg Oral Daily   fluticasone  furoate-vilanterol  1 puff Inhalation Daily   folic acid   1 mg Oral Daily   ipratropium-albuterol   3 mL Nebulization BID   losartan  25 mg Oral Daily   methylPREDNISolone  (SOLU-MEDROL ) injection  60 mg Intravenous Daily   metoprolol succinate  25 mg Oral Daily   montelukast   10 mg Oral Daily   nicotine   14 mg Transdermal Daily   pantoprazole  40 mg Oral Daily   potassium chloride   10 mEq Oral Daily   spironolactone  25 mg Oral Daily   tamsulosin  0.4 mg Oral Daily   torsemide  40 mg Oral Daily   venlafaxine XR  75 mg Oral Q breakfast   Continuous Infusions:     LOS: 9 days     Anthony CHRISTELLA Pouch, MD [facility] Pager [phone]  If 7PM-7AM, please contact night-coverage [email or web address] 04/03/2024, 8:27 AM

## 2024-04-03 NOTE — Progress Notes (Signed)
 PT Cancellation Note  Patient Details Name: Miguel Howell MRN: [id number] DOB: 05-09-1956   Cancelled Treatment:    Reason Eval/Treat Not Completed: Other (comment) (Patient just finishing with OT, has already ambulated with mobility, and feeling fatigued. PT will continue with attempts)  Randine Essex, PT, MPT  [name] 04/03/2024, 3:17 PM

## 2024-04-03 NOTE — Progress Notes (Signed)
 Mobility Specialist - Progress Note  Pre-mobility: HR-79, SpO2-93%  During mobility: HR-81,SpO2-94%  Post-mobility: HR-85,  SPO2-91%   04/03/24 1200  Oxygen Therapy  SpO2 93 %  O2 Device Nasal Cannula  O2 Flow Rate (L/min) 2 L/min  Mobility  Activity Ambulated with assistance  Level of Assistance Contact guard assist, steadying assist  Assistive Device Front wheel walker  Distance Ambulated (ft) 35 ft  Range of Motion/Exercises All extremities  Activity Response Tolerated well  Mobility visit 1 Mobility  Mobility Specialist Start Time (ACUTE ONLY) 1147  Mobility Specialist Stop Time (ACUTE ONLY) 1211  Mobility Specialist Time Calculation (min) (ACUTE ONLY) 24 min   Pt was supine in bed with HOB elevated. Pt is on O2 @ 2L with guest in the room. Pt agreed to mobility. Pt O2 vitals were taken throughout activity as a precaution.Pt is able to get to the EOB independently. Pt is able to STS independently with 2WW. Pt ambulated well with 2 [employer]. Pt did complain about exhaustion through activity. After activity pt returned to the room. Pt is in the room with needs in reach.    Clem Rodes Mobility Specialist 04/03/24, 12:24 PM

## 2024-04-03 NOTE — Progress Notes (Signed)
 Occupational Therapy Treatment Patient Details Name: Miguel Howell MRN: [id number] DOB: 05/04/1956 Today's Date: 04/03/2024   History of present illness Patient is a 68 year old male with worsening shortness of breath. PMH:  HTN, HLD, HFpEF, COPD   OT comments  Pt received in bed on 2L O2, agreeable to OT session. Pt performs oral care seated with setup, STS transfers using RW MIN A, t/f bathroom for continent void with MIN A for toilet transfer with GB use, MAX A for standing pericare, and CGA for washing hands at sink. LB dressing completed with MIN A from STS. Edu provided to pt and sister on energy conservation strategies and activity pacing with pt/sister verbalizing understanding. Discharge recommendation appropriate. OT will continue to follow.       If plan is discharge home, recommend the following:  A little help with walking and/or transfers;A little help with bathing/dressing/bathroom;Help with stairs or ramp for entrance;Assist for transportation;Assistance with cooking/housework   Equipment Recommendations  None recommended by OT       Precautions / Restrictions Precautions Precautions: Fall Recall of Precautions/Restrictions: Intact Restrictions Weight Bearing Restrictions Per Provider Order: No Other Position/Activity Restrictions: monitor O2       Mobility Bed Mobility Overal bed mobility: Needs Assistance Bed Mobility: Supine to Sit, Sit to Supine     Supine to sit: Contact guard Sit to supine: Supervision        Transfers Overall transfer level: Needs assistance Equipment used: Rolling walker (2 wheels) Transfers: Sit to/from Stand Sit to Stand: Min assist           General transfer comment: minA to rise from bed and commode, cues for anterior weight shift and hand placement     Balance Overall balance assessment: Needs assistance Sitting-balance support: Feet supported Sitting balance-Leahy Scale: Good     Standing balance support:  Bilateral upper extremity supported Standing balance-Leahy Scale: Fair Standing balance comment: UE support on RW                           ADL either performed or assessed with clinical judgement   ADL Overall ADL's : Needs assistance/impaired     Grooming: Standing;Contact guard assist;Wash/dry hands;Sitting;Oral care Grooming Details (indicate cue type and reason): sink level             Lower Body Dressing: Minimal assistance;Sit to/from stand Lower Body Dressing Details (indicate cue type and reason): minA to doff/don pants from STS Toilet Transfer: Minimal assistance;Ambulation;Regular Toilet;Rolling walker (2 wheels) Toilet Transfer Details (indicate cue type and reason): minA for STS transfer on standard commode, GB used Toileting- Clothing Manipulation and Hygiene: Maximal assistance;Supervision/safety;Sitting/lateral lean Toileting - Clothing Manipulation Details (indicate cue type and reason): seated lateral lean after cont BM and urine on toilet     Functional mobility during ADLs: Minimal assistance;Rolling walker (2 wheels) General ADL Comments: t/f bathroom, seated oral care     Communication Communication Communication: Impaired Factors Affecting Communication: Reduced clarity of speech   Cognition Arousal: Alert Behavior During Therapy: WFL for tasks assessed/performed Cognition: No apparent impairments             OT - Cognition Comments: .                 Following commands: Intact Following commands impaired: Follows multi-step commands with increased time      Cueing   Cueing Techniques: Verbal cues  Exercises Other Exercises Other Exercises: edu on energy  conservation and activity pacing    Shoulder Instructions       General Comments on 2L beginning of session, increased to 4L O2 prior to mobility, left on 2L end of session    Pertinent Vitals/ Pain       Pain Assessment Pain Assessment: No/denies pain  Home  Living                                          Prior Functioning/Environment              Frequency  Min 2X/week        Progress Toward Goals  OT Goals(current goals can now be found in the care plan section)  Progress towards OT goals: Progressing toward goals  Acute Rehab OT Goals OT Goal Formulation: With patient Time For Goal Achievement: 04/13/24 Potential to Achieve Goals: Good ADL Goals Pt Will Perform Lower Body Dressing: with supervision;sitting/lateral leans;sit to/from stand Pt Will Transfer to Toilet: with supervision;ambulating Additional ADL Goal #1: Pt will demo implementation of 1 learned ECS during ADL performance 2/2 trials to maximize IND/safety and prevent overexertion.  Plan      Co-evaluation                 AM-PAC OT 6 Clicks Daily Activity     Outcome Measure   Help from another person eating meals?: None Help from another person taking care of personal grooming?: A Little Help from another person toileting, which includes using toliet, bedpan, or urinal?: A Little Help from another person bathing (including washing, rinsing, drying)?: A Lot Help from another person to put on and taking off regular upper body clothing?: A Little Help from another person to put on and taking off regular lower body clothing?: A Lot 6 Click Score: 17    End of Session Equipment Utilized During Treatment: Oxygen;Rolling walker (2 wheels)  OT Visit Diagnosis: Other abnormalities of gait and mobility (R26.89);Muscle weakness (generalized) (M62.81)   Activity Tolerance Patient tolerated treatment well   Patient Left with call bell/phone within reach;with chair alarm set;in bed;with family/visitor present   Nurse Communication Mobility status        Time: 1412-1450 OT Time Calculation (min): 38 min  Charges: OT General Charges $OT Visit: 1 Visit OT Treatments $Self Care/Home Management : 23-37 mins $Therapeutic Activity:  8-22 mins Teron Blais L. Gavriella Hearst, OTR/L  04/03/24, 2:57 PM

## 2024-04-03 NOTE — TOC Progression Note (Addendum)
 Transition of Care [facility]) - Progression Note    Patient Details  Name: Miguel Howell MRN: [id number] Date of Birth: [birth year]  [facility]) CM/SW Contact  Lauraine [name], LCSW Phone Number: 04/03/2024, 10:19 AM  Clinical Narrative:   Lillis calling [facility] admissions coordinator on her cell phone again. Did not leave another voicemail.  12:55 pm: Per MD, patient and sister are also interested in [facility] in [place]. CSW spoke to admissions coordinator and she confirmed they are in network with his insurance. CSW faxed referral for review. If [facility] cannot offer a bed, sister plans on bringing patient home with her.  3:02 pm: [facility] has reviewed patient's clinicals. They are now checking his insurance.  Expected Discharge Plan and Services                                               Social Drivers of Health (SDOH) Interventions SDOH Screenings   Food Insecurity: No Food Insecurity (03/27/2024)  Housing: Low Risk  (03/27/2024)  Transportation Needs: No Transportation Needs (03/27/2024)  Utilities: Not At Risk (03/27/2024)  Financial Resource Strain: Low Risk  (05/07/2023)   Received from [facility]  Social Connections: Unknown (03/27/2024)  Tobacco Use: High Risk (03/25/2024)    Readmission Risk Interventions     No data to display

## 2024-04-04 DIAGNOSIS — J9621 Acute and chronic respiratory failure with hypoxia: Secondary | ICD-10-CM | POA: Diagnosis not present

## 2024-04-04 DIAGNOSIS — J9622 Acute and chronic respiratory failure with hypercapnia: Secondary | ICD-10-CM | POA: Diagnosis not present

## 2024-04-04 LAB — BASIC METABOLIC PANEL WITH GFR
Anion gap: 12 (ref 5–15)
BUN: 25 mg/dL — ABNORMAL HIGH (ref 8–23)
CO2: 38 mmol/L — ABNORMAL HIGH (ref 22–32)
Calcium: 9.3 mg/dL (ref 8.9–10.3)
Chloride: 91 mmol/L — ABNORMAL LOW (ref 98–111)
Creatinine, Ser: 0.79 mg/dL (ref 0.61–1.24)
GFR, Estimated: >60 mL/min (ref >60)
Glucose, Bld: 144 mg/dL — ABNORMAL HIGH (ref 70–99)
Potassium: 4.2 mmol/L (ref 3.5–5.1)
Sodium: 141 mmol/L (ref 135–145)

## 2024-04-04 LAB — CBC
HCT: 56.5 % — ABNORMAL HIGH (ref 39.0–52.0)
Hemoglobin: 17.8 g/dL — ABNORMAL HIGH (ref 13.0–17.0)
MCH: 35 pg — ABNORMAL HIGH (ref 26.0–34.0)
MCHC: 31.5 g/dL (ref 30.0–36.0)
MCV: 111.2 fL — ABNORMAL HIGH (ref 80.0–100.0)
Platelets: 68 K/uL — ABNORMAL LOW (ref 150–400)
RBC: 5.08 MIL/uL (ref 4.22–5.81)
RDW: 13.6 % (ref 11.5–15.5)
WBC: 5.9 K/uL (ref 4.0–10.5)
nRBC: 0 % (ref 0.0–0.2)

## 2024-04-04 MED ORDER — METHYLPREDNISOLONE SODIUM SUCC 125 MG IJ SOLR
50.0000 mg | Freq: Every day | INTRAMUSCULAR | Status: DC
  Administered 2024-04-05 – 2024-04-06 (×2): 50 mg via INTRAVENOUS
  Filled 2024-04-04 (×2): qty 2

## 2024-04-04 NOTE — Progress Notes (Addendum)
 PROGRESS NOTE   HPI was taken from Dr. Fernand: Miguel Howell is a 68 y.o. year old male with past medical history of HTN, HLD, HFpEF, COPD presenting to the ED with worsening shortness of breath.      Pt reports he has been feeling unwell for 3-4 weeks. He has been getting more short of breath but this improves and then gets worse. Over the last few days it has been getting worse.  Patient also has worsening sputum production.     ED Course: On arrival to the ED patient was noted to be HDS stable. CBC without leukocytosis, but macrocytosis and erythrocytosis. BMP with hyponatremia, AKI and elevated bicarb. CXR without any acute findings.      [facility] contacted for admission.   Miguel Howell  FMW:[id number] DOB: [birth year] DOA: 03/25/2024 PCP: Auston Reyes BIRCH, MD    Assessment & Plan:   Principal Problem:   Acute on chronic respiratory failure with hypoxia and hypercapnia (HCC) Active Problems:   Acute on chronic diastolic CHF (congestive heart failure) (HCC)   COPD exacerbation (HCC)   Hypokalemia   Lobar pneumonia   HTN (hypertension)   Acute kidney injury superimposed on CKD   Obesity (BMI 30-39.9)   Erythrocytosis   Thrombocytopenia   Acute urinary retention   Hypoxia   Thrush  Assessment and Plan: Acute hypoxic & hypercapnic respiratory failure: continue on supplemental oxygen and wean as tolerated. BiPAP prn. Does not use oxygen at home. Will d/c w/ supplemental oxygen   Acute on chronic diastolic CHF: with anasarca.  EF 50 to 55%. Continue on torsemide, aldactone, losartan, metoprolol, jardiance. Monitor I/Os   COPD exacerbation: started steroid taper & continue on bronchodilators, encourage incentive spirometry    Lobar pneumonia: completed abx course. Started on steroid taper and continue on bronchodilators. Encourage incentive spirometry    Hypokalemia: WNL today    HTN: continue on aldactone, losartan, metoprolol    AKI on CKDIIIa: Cr is trending down again  today    Obesity: BMI 33.6. Would benefit from weight loss   Thrush: improved. Continue on po fluconazole    Acute urinary retention: continue on flomax   Thrombocytopenia: labile. Etiology unclear.    Erythrocytosis: likely secondary to chronic hypoxia. JAK2 is pending still.  Erythropoietin level actually low.        DVT prophylaxis: SCDs, lovenox  Code Status: DNR Family Communication: discussed pt's care w/ pt's sister at bedside and answered her questions Disposition Plan: likely d/c to SNF  Level of care: Telemetry Cardiac Status is: Inpatient Remains inpatient appropriate because: severity of illness, waiting on SNF placement, waiting hear back from [facility] SNF as per CM.    Consultants:  Palliative care  Procedures:   Antimicrobials:    Subjective: Pt c/o malaise  Objective: Vitals:   04/04/24 0042 04/04/24 0351 04/04/24 0500 04/04/24 0813  BP: (!) 108/93 128/81  121/67  Pulse: 81 85  82  Resp: 18 17  17   Temp: (!) 97.4 F (36.3 C) 97.7 F (36.5 C)  (!) 97.5 F (36.4 C)  TempSrc:    Oral  SpO2: 94% 91%  97%  Weight:   100.6 kg   Height:        Intake/Output Summary (Last 24 hours) at 04/04/2024 0910 Last data filed at 04/03/2024 1350 Gross per 24 hour  Intake 30 ml  Output 872 ml  Net -842 ml   Filed Weights   04/02/24 0500 04/03/24 0501 04/04/24 0500  Weight: 103.3 kg  101.3 kg 100.6 kg    Examination:  General exam: appears calm & comfortable  Respiratory system: diminished breath sounds b/l  Cardiovascular system: S1 & S2+. No rubs or gallops Gastrointestinal system: abd is soft, NT, obese & hypoactive bowel sounds  Central nervous system: alert & oriented. Moves all extremities  Psychiatry: Judgement and insight appears at baseline. Flat mood and affect    Data Reviewed: I have personally reviewed following labs and imaging studies  CBC: Recent Labs  Lab 03/30/24 0449 04/01/24 0427 04/02/24 0412 04/03/24 0440  04/04/24 0419  WBC 5.5 5.0 5.1 5.3 5.9  HGB 17.5* 18.4* 17.7* 18.0* 17.8*  HCT 54.8* 58.8* 57.8* 56.6* 56.5*  MCV 110.3* 113.3* 113.6* 113.0* 111.2*  PLT 95* 75* 70* 66* 68*   Basic Metabolic Panel: Recent Labs  Lab 03/31/24 0429 04/01/24 0427 04/02/24 0412 04/03/24 0440 04/04/24 0419  NA 135 141 139 139 141  K 4.6 4.6 4.2 4.3 4.2  CL 89* 92* 90* 91* 91*  CO2 36* 39* 38* 36* 38*  GLUCOSE 213* 171* 109* 108* 144*  BUN 38* 41* 35* 28* 25*  CREATININE 0.86 1.15 1.01 0.84 0.79  CALCIUM  9.6 9.8 9.6 9.3 9.3   GFR: Estimated Creatinine Clearance: 106.2 mL/min (by C-G formula based on SCr of 0.79 mg/dL). Liver Function Tests: No results for input(s): AST, ALT, ALKPHOS, BILITOT, PROT, ALBUMIN in the last 168 hours. No results for input(s): LIPASE, AMYLASE in the last 168 hours. No results for input(s): AMMONIA in the last 168 hours. Coagulation Profile: No results for input(s): INR, PROTIME in the last 168 hours. Cardiac Enzymes: No results for input(s): CKTOTAL, CKMB, CKMBINDEX, TROPONINI in the last 168 hours. BNP (last 3 results) No results for input(s): PROBNP in the last 8760 hours. HbA1C: No results for input(s): HGBA1C in the last 72 hours. CBG: Recent Labs  Lab 03/28/24 1739  GLUCAP 174*   Lipid Profile: No results for input(s): CHOL, HDL, LDLCALC, TRIG, CHOLHDL, LDLDIRECT in the last 72 hours. Thyroid  Function Tests: No results for input(s): TSH, T4TOTAL, FREET4, T3FREE, THYROIDAB in the last 72 hours. Anemia Panel: No results for input(s): VITAMINB12, FOLATE, FERRITIN, TIBC, IRON, RETICCTPCT in the last 72 hours. Sepsis Labs: No results for input(s): PROCALCITON, LATICACIDVEN in the last 168 hours.  Recent Results (from the past 240 hours)  Expectorated Sputum Assessment w Gram Stain, Rflx to Resp Cult     Status: None   Collection Time: 03/28/24  9:56 AM   Specimen: Sputum  Result Value  Ref Range Status   Specimen Description SPUTUM  Final   Special Requests Normal  Final   Sputum evaluation   Final    THIS SPECIMEN IS ACCEPTABLE FOR SPUTUM CULTURE Performed at [facility], [address], [place], [place] [address]    Report Status 03/28/2024 FINAL  Final  Culture, Respiratory w Gram Stain     Status: None   Collection Time: 03/28/24  9:56 AM   Specimen: SPU  Result Value Ref Range Status   Specimen Description   Final    SPUTUM Performed at [facility], [address] [place], [place] [address]    Special Requests   Final    Normal Reflexed from [id number] Performed at [facility], [address] [place], [place] [address]    Gram Stain   Final    ABUNDANT SQUAMOUS EPITHELIAL CELLS PRESENT FEW WBC PRESENT, PREDOMINANTLY PMN ABUNDANT GRAM NEGATIVE RODS ABUNDANT GRAM POSITIVE COCCI IN PAIRS RARE YEAST  WITH PSEUDOHYPHAE Performed at [facility], [address], [place], [place] [address]    Culture ABUNDANT SERRATIA MARCESCENS  Final   Report Status 03/30/2024 FINAL  Final   Organism ID, Bacteria SERRATIA MARCESCENS  Final      Susceptibility   Serratia marcescens - MIC*    CEFEPIME  <=0.12 SENSITIVE Sensitive     ERTAPENEM <=0.12 SENSITIVE Sensitive     CEFTRIAXONE  <=0.25 SENSITIVE Sensitive     CIPROFLOXACIN  0.12 SENSITIVE Sensitive     GENTAMICIN <=1 SENSITIVE Sensitive     MEROPENEM <=0.25 SENSITIVE Sensitive     TRIMETH/SULFA <=20 SENSITIVE Sensitive     * ABUNDANT SERRATIA MARCESCENS         Radiology Studies: No results found.      Scheduled Meds:  amitriptyline  25 mg Oral QHS   aspirin  EC  81 mg Oral Daily   budesonide  (PULMICORT ) nebulizer solution  0.5 mg Nebulization BID   dapagliflozin propanediol  10 mg Oral Daily   docusate sodium   100 mg Oral BID   enoxaparin  (LOVENOX ) injection  0.5 mg/kg Subcutaneous Q24H   fluconazole  100 mg Oral Daily   fluticasone  furoate-vilanterol  1 puff  Inhalation Daily   folic acid   1 mg Oral Daily   ipratropium-albuterol   3 mL Nebulization BID   losartan  25 mg Oral Daily   methylPREDNISolone  (SOLU-MEDROL ) injection  60 mg Intravenous Daily   metoprolol succinate  25 mg Oral Daily   montelukast   10 mg Oral Daily   nicotine   14 mg Transdermal Daily   pantoprazole  40 mg Oral Daily   potassium chloride   10 mEq Oral Daily   spironolactone  25 mg Oral Daily   tamsulosin  0.4 mg Oral Daily   torsemide  40 mg Oral Daily   venlafaxine XR  75 mg Oral Q breakfast   Continuous Infusions:     LOS: 10 days     Anthony CHRISTELLA Pouch, MD [facility] Pager [phone]  If 7PM-7AM, please contact night-coverage [email or web address] 04/04/2024, 9:10 AM

## 2024-04-04 NOTE — Progress Notes (Signed)
 Mobility Specialist - Progress Note Pre-mobility: HR-80,  SpO2-93%  During mobility: HR-96,  SpO2-95%  Post-mobility: HR-84, SPO2-91%   04/04/24 1200  Oxygen Therapy  SpO2 97 %  O2 Device Nasal Cannula  O2 Flow Rate (L/min) 2 L/min  Mobility  Activity Ambulated with assistance;Stood at bedside;Dangled on edge of bed  Level of Assistance Contact guard assist, steadying assist  Assistive Device Front wheel walker  Distance Ambulated (ft) 40 ft  Range of Motion/Exercises All extremities  Activity Response Tolerated well  Mobility visit 1 Mobility  Mobility Specialist Start Time (ACUTE ONLY) 1101  Mobility Specialist Stop Time (ACUTE ONLY) 1123  Mobility Specialist Time Calculation (min) (ACUTE ONLY) 22 min   Pt was at the EOB on O2 @ 2L with guest in the room. Pt agreed to mobility. Pt was able to STS (2X) with a 2 [employer]. O2 vitals were taken throughout activity as a precaution. Pt ambulated well. Pt did use recovery break during ambulation as a precaution. O2 vitals did not go under 88% throughout activity. After activity pt returned to the room back to the EOB with needs in reach and guest in the room.  Clem Rodes Mobility Specialist 04/04/24, 12:15 PM

## 2024-04-04 NOTE — TOC Progression Note (Signed)
 Transition of Care [facility]) - Progression Note    Patient Details  Name: Miguel Howell MRN: [id number] Date of Birth: [birth year]  [facility] CM/SW Contact  Racheal LITTIE Schimke, RN Phone Number: 04/04/2024, 3:09 PM  Clinical Narrative: Called and spoke with Admission Coordinator Tiffany concerning bed offer, she says the Nurse will review and let her know if the facility can meet his needs.                       Expected Discharge Plan and Services                                               Social Drivers of Health (SDOH) Interventions SDOH Screenings   Food Insecurity: No Food Insecurity (03/27/2024)  Housing: Low Risk  (03/27/2024)  Transportation Needs: No Transportation Needs (03/27/2024)  Utilities: Not At Risk (03/27/2024)  Financial Resource Strain: Low Risk  (05/07/2023)   Received from [facility]  Social Connections: Unknown (03/27/2024)  Tobacco Use: High Risk (03/25/2024)    Readmission Risk Interventions     No data to display

## 2024-04-04 NOTE — Plan of Care (Signed)

## 2024-04-04 NOTE — Progress Notes (Signed)
 Heart Failure Navigator Progress Note Patient had previously scheduled AHF TOC on 04/07/24.  Rescheduled this with his sister Miguel Howell due to the fact he will be discharged to rehab where she lives in [place].  After he completes rehab he will move in with her for about a week, then move back to his home in [place].  HF TOC moved out until 04/24/2024 @ 12:00 @ the AHF Clinic [facility]. Miguel Howell is aware of appointment details.  Navigator will sign-off at this time.  Miguel Pines, RN, BSN [facility] Heart Failure Navigator Secure Chat Only

## 2024-04-05 DIAGNOSIS — J9621 Acute and chronic respiratory failure with hypoxia: Secondary | ICD-10-CM | POA: Diagnosis not present

## 2024-04-05 DIAGNOSIS — J9622 Acute and chronic respiratory failure with hypercapnia: Secondary | ICD-10-CM | POA: Diagnosis not present

## 2024-04-05 LAB — BASIC METABOLIC PANEL WITH GFR
Anion gap: 9 (ref 5–15)
BUN: 23 mg/dL (ref 8–23)
CO2: 44 mmol/L — ABNORMAL HIGH (ref 22–32)
Calcium: 9.8 mg/dL (ref 8.9–10.3)
Chloride: 90 mmol/L — ABNORMAL LOW (ref 98–111)
Creatinine, Ser: 1.07 mg/dL (ref 0.61–1.24)
GFR, Estimated: >60 mL/min (ref >60)
Glucose, Bld: 125 mg/dL — ABNORMAL HIGH (ref 70–99)
Potassium: 6.7 mmol/L (ref 3.5–5.1)
Sodium: 143 mmol/L (ref 135–145)

## 2024-04-05 LAB — CBC
HCT: 59.1 % — ABNORMAL HIGH (ref 39.0–52.0)
Hemoglobin: 18.2 g/dL — ABNORMAL HIGH (ref 13.0–17.0)
MCH: 35.1 pg — ABNORMAL HIGH (ref 26.0–34.0)
MCHC: 30.8 g/dL (ref 30.0–36.0)
MCV: 113.9 fL — ABNORMAL HIGH (ref 80.0–100.0)
Platelets: 67 K/uL — ABNORMAL LOW (ref 150–400)
RBC: 5.19 MIL/uL (ref 4.22–5.81)
RDW: 13.7 % (ref 11.5–15.5)
WBC: 6.1 K/uL (ref 4.0–10.5)
nRBC: 0 % (ref 0.0–0.2)

## 2024-04-05 LAB — POTASSIUM: Potassium: 4.2 mmol/L (ref 3.5–5.1)

## 2024-04-05 MED ORDER — SPIRONOLACTONE 25 MG PO TABS
25.0000 mg | ORAL_TABLET | Freq: Every day | ORAL | Status: DC
  Administered 2024-04-05 – 2024-04-08 (×4): 25 mg via ORAL
  Filled 2024-04-05 (×4): qty 1

## 2024-04-05 MED ORDER — LOSARTAN POTASSIUM 25 MG PO TABS
25.0000 mg | ORAL_TABLET | Freq: Every day | ORAL | Status: DC
  Administered 2024-04-05 – 2024-04-08 (×4): 25 mg via ORAL
  Filled 2024-04-05 (×4): qty 1

## 2024-04-05 NOTE — Progress Notes (Signed)
 CRITICAL VALUE STICKER  CRITICAL VALUE: K 6.7  RECEIVER (on-site recipient of call): Aanvi Voyles, RN    DATE & TIME NOTIFIED: 04/05/2024 0744  MESSENGER (representative from lab): Deatrice  MD NOTIFIEDBETHA Cho  TIME OF NOTIFICATION: 04/05/2024 0744  RESPONSE: 04/05/2024 [id number]

## 2024-04-05 NOTE — Plan of Care (Signed)

## 2024-04-05 NOTE — Progress Notes (Addendum)
 Progress Note    Miguel Howell  FMW:[id number] DOB: 10-21-1955  DOA: 03/25/2024 PCP: Auston Reyes BIRCH, MD      Brief Narrative:    Medical records reviewed and are as summarized below:  Miguel Howell is a 68 y.o. male with medical history significant for hypertension, hyperlipidemia, chronic HFpEF, COPD, who presented to the hospital with shortness of breath.  He had been feeling unwell for about 3 to 4 weeks prior to admission.  Shortness of breath had progressively gotten worse.  He also noted increasing sputum production.   He was found to have acute hypoxic and hypercapnic respiratory failure, CHF and COPD exacerbation.        Assessment/Plan:   Principal Problem:   Acute on chronic respiratory failure with hypoxia and hypercapnia (HCC) Active Problems:   Acute on chronic diastolic CHF (congestive heart failure) (HCC)   COPD exacerbation (HCC)   Hypokalemia   Lobar pneumonia   HTN (hypertension)   Acute kidney injury superimposed on CKD   Obesity (BMI 30-39.9)   Erythrocytosis   Thrombocytopenia   Acute urinary retention   Hypoxia   Thrush    Body mass index is 33.31 kg/m.  (Class I obesity)   Acute on chronic hypoxic and hypercapnic respiratory failure: Continue 2 L/min oxygen via [place].  Use BiPAP at night.  Of note, Diane (daughter) said patient had not used BiPAP for the last 3 nights and he has done well thus far.  I recommended strict adherence to BiPAP use at night to reduce the risk of acute decompensation.   COPD exacerbation: Discontinue Solu-Medrol .  Continue bronchodilators.   Acute on chronic diastolic CHF with anasarca: Improved.  S/p treatment with IV Lasix .  Net fluid loss 18.9 L thus far.  Weight loss from 252 to 25 pounds. Continue torsemide, spironolactone, Toprol and losartan.   Pneumonia: Completed antibiotics   Thrombocytopenia: Platelet count is stable.   Oral thrush: Improved with fluconazole.  Plan to complete 7 days  of treatment on 04/06/2024   AKI on CKD stage IIIa: Improved Hypokalemia: Improved. Of note potassium on BMP was reported at 6.7 today.  However, repeat potassium was 4.2. Potassium supplement has been held for now.   Diet Order             Diet Heart Fluid consistency: Thin  Diet effective now                                  Consultants: Pulmonologist Palliative care  Procedures: None    Medications:    amitriptyline  25 mg Oral QHS   aspirin  EC  81 mg Oral Daily   budesonide  (PULMICORT ) nebulizer solution  0.5 mg Nebulization BID   dapagliflozin propanediol  10 mg Oral Daily   docusate sodium   100 mg Oral BID   enoxaparin  (LOVENOX ) injection  0.5 mg/kg Subcutaneous Q24H   fluconazole  100 mg Oral Daily   fluticasone  furoate-vilanterol  1 puff Inhalation Daily   folic acid   1 mg Oral Daily   ipratropium-albuterol   3 mL Nebulization BID   losartan  25 mg Oral Daily   methylPREDNISolone  (SOLU-MEDROL ) injection  50 mg Intravenous Daily   metoprolol succinate  25 mg Oral Daily   montelukast   10 mg Oral Daily   nicotine   14 mg Transdermal Daily   pantoprazole  40 mg Oral Daily   spironolactone  25 mg  Oral Daily   tamsulosin  0.4 mg Oral Daily   torsemide  40 mg Oral Daily   venlafaxine XR  75 mg Oral Q breakfast   Continuous Infusions:   Anti-infectives (From admission, onward)    Start     Dose/Rate Route Frequency Ordered Stop   03/31/24 1245  fluconazole (DIFLUCAN) tablet 100 mg        100 mg Oral Daily 03/31/24 1145     03/29/24 1800  fluconazole (DIFLUCAN) IVPB 100 mg  Status:  Discontinued        100 mg 50 mL/hr over 60 Minutes Intravenous Every 24 hours 03/28/24 1723 03/31/24 1145   03/29/24 1400  cefTRIAXone  (ROCEPHIN ) 2 g in sodium chloride  0.9 % 100 mL IVPB        2 g 200 mL/hr over 30 Minutes Intravenous Every 24 hours 03/29/24 1305 04/02/24 1111   03/28/24 1800  fluconazole (DIFLUCAN) IVPB 200 mg       Note to Pharmacy: thrush    200 mg 100 mL/hr over 60 Minutes Intravenous  Once 03/28/24 1648 03/28/24 1836   03/28/24 1800  fluconazole (DIFLUCAN) IVPB 100 mg  Status:  Discontinued        100 mg 50 mL/hr over 60 Minutes Intravenous Every 24 hours 03/28/24 1648 03/28/24 1723   03/26/24 0115  azithromycin  (ZITHROMAX ) 500 mg in sodium chloride  0.9 % 250 mL IVPB        500 mg 250 mL/hr over 60 Minutes Intravenous Every 24 hours 03/26/24 0102 03/28/24 0338              Family Communication/Anticipated D/C date and plan/Code Status   DVT prophylaxis: Place and maintain sequential compression device Start: 04/02/24 1508 Place TED hose Start: 03/28/24 1004     Code Status: Do not attempt resuscitation (DNR) PRE-ARREST INTERVENTIONS DESIRED  Family Communication: Plan discussed with Diane, sister, at the bedside Disposition Plan: Plan to discharge to SNF   Status is: Inpatient Remains inpatient appropriate because: Awaiting placement to SNF       Subjective:   Interval events noted.  No complaints.  No shortness of breath or chest pain.  Diane, sister, bedside.  Objective:    Vitals:   04/05/24 0500 04/05/24 0720 04/05/24 0911 04/05/24 1148  BP:   123/74 (!) 155/77  Pulse:   84 85  Resp:   16 18  Temp:   98 F (36.7 C) 97.7 F (36.5 C)  TempSrc:    Oral  SpO2:  98% 99% 96%  Weight: 102.3 kg     Height:       No data found.   Intake/Output Summary (Last 24 hours) at 04/05/2024 1457 Last data filed at 04/05/2024 1032 Gross per 24 hour  Intake 0 ml  Output 875 ml  Net -875 ml   Filed Weights   04/03/24 0501 04/04/24 0500 04/05/24 0500  Weight: 101.3 kg 100.6 kg 102.3 kg    Exam:  GEN: NAD, soft speech SKIN: Warm and dry EYES: No pallor or icterus ENT: MMM CV: RRR PULM: CTA B ABD: soft, obese, NT, +BS CNS: AAO x 3, non focal EXT: No edema or tenderness        Data Reviewed:   I have personally reviewed following labs and imaging studies:  Labs: Labs show the  following:   Basic Metabolic Panel: Recent Labs  Lab 04/01/24 0427 04/02/24 0412 04/03/24 0440 04/04/24 0419 04/05/24 0545 04/05/24 0925  NA 141 139 139 141 143  --  K 4.6 4.2 4.3 4.2 6.7* 4.2  CL 92* 90* 91* 91* 90*  --   CO2 39* 38* 36* 38* 44*  --   GLUCOSE 171* 109* 108* 144* 125*  --   BUN 41* 35* 28* 25* 23  --   CREATININE 1.15 1.01 0.84 0.79 1.07  --   CALCIUM  9.8 9.6 9.3 9.3 9.8  --    GFR Estimated Creatinine Clearance: 80 mL/min (by C-G formula based on SCr of 1.07 mg/dL). Liver Function Tests: No results for input(s): AST, ALT, ALKPHOS, BILITOT, PROT, ALBUMIN in the last 168 hours. No results for input(s): LIPASE, AMYLASE in the last 168 hours. No results for input(s): AMMONIA in the last 168 hours. Coagulation profile No results for input(s): INR, PROTIME in the last 168 hours.  CBC: Recent Labs  Lab 04/01/24 0427 04/02/24 0412 04/03/24 0440 04/04/24 0419 04/05/24 0545  WBC 5.0 5.1 5.3 5.9 6.1  HGB 18.4* 17.7* 18.0* 17.8* 18.2*  HCT 58.8* 57.8* 56.6* 56.5* 59.1*  MCV 113.3* 113.6* 113.0* 111.2* 113.9*  PLT 75* 70* 66* 68* 67*   Cardiac Enzymes: No results for input(s): CKTOTAL, CKMB, CKMBINDEX, TROPONINI in the last 168 hours. BNP (last 3 results) No results for input(s): PROBNP in the last 8760 hours. CBG: No results for input(s): GLUCAP in the last 168 hours. D-Dimer: No results for input(s): DDIMER in the last 72 hours. Hgb A1c: No results for input(s): HGBA1C in the last 72 hours. Lipid Profile: No results for input(s): CHOL, HDL, LDLCALC, TRIG, CHOLHDL, LDLDIRECT in the last 72 hours. Thyroid  function studies: No results for input(s): TSH, T4TOTAL, T3FREE, THYROIDAB in the last 72 hours.  Invalid input(s): FREET3 Anemia work up: No results for input(s): VITAMINB12, FOLATE, FERRITIN, TIBC, IRON, RETICCTPCT in the last 72 hours. Sepsis Labs: Recent Labs  Lab  04/02/24 0412 04/03/24 0440 04/04/24 0419 04/05/24 0545  WBC 5.1 5.3 5.9 6.1    Microbiology Recent Results (from the past 240 hours)  Expectorated Sputum Assessment w Gram Stain, Rflx to Resp Cult     Status: None   Collection Time: 03/28/24  9:56 AM   Specimen: Sputum  Result Value Ref Range Status   Specimen Description SPUTUM  Final   Special Requests Normal  Final   Sputum evaluation   Final    THIS SPECIMEN IS ACCEPTABLE FOR SPUTUM CULTURE Performed at [facility], [address] [place], [place] [address]    Report Status 03/28/2024 FINAL  Final  Culture, Respiratory w Gram Stain     Status: None   Collection Time: 03/28/24  9:56 AM   Specimen: SPU  Result Value Ref Range Status   Specimen Description   Final    SPUTUM Performed at [facility], [address] [place], [place] [address]    Special Requests   Final    Normal Reflexed from [id number] Performed at [facility], [address] [place], [place] [address]    Gram Stain   Final    ABUNDANT SQUAMOUS EPITHELIAL CELLS PRESENT FEW WBC PRESENT, PREDOMINANTLY PMN ABUNDANT GRAM NEGATIVE RODS ABUNDANT GRAM POSITIVE COCCI IN PAIRS RARE YEAST WITH PSEUDOHYPHAE Performed at [facility], [address], [place], [place] [address]    Culture ABUNDANT SERRATIA MARCESCENS  Final   Report Status 03/30/2024 FINAL  Final   Organism ID, Bacteria SERRATIA MARCESCENS  Final      Susceptibility   Serratia marcescens - MIC*    CEFEPIME  <=0.12 SENSITIVE Sensitive  ERTAPENEM <=0.12 SENSITIVE Sensitive     CEFTRIAXONE  <=0.25 SENSITIVE Sensitive     CIPROFLOXACIN  0.12 SENSITIVE Sensitive     GENTAMICIN <=1 SENSITIVE Sensitive     MEROPENEM <=0.25 SENSITIVE Sensitive     TRIMETH/SULFA <=20 SENSITIVE Sensitive     * ABUNDANT SERRATIA MARCESCENS    Procedures and diagnostic studies:  No results found.             LOS: 11 days   Amen Staszak  [employer]ation Officer on [email or web address]. If 7PM-7AM, please contact night-coverage at [email or web address]     04/05/2024, 2:57 PM

## 2024-04-05 NOTE — Progress Notes (Signed)
 Mobility Specialist - Progress Note  Pre-mobility: HR-86,  SpO2-94%  During mobility: HR-106, SpO2-91%  Post-mobility: HR-99,  SPO2-90%   04/05/24 1100  Mobility  Activity Ambulated with assistance;Stood at bedside;Dangled on edge of bed  Level of Assistance Contact guard assist, steadying assist  Assistive Device Front wheel walker  Distance Ambulated (ft) 50 ft  Range of Motion/Exercises All extremities  Activity Response Tolerated well  Mobility visit 1 Mobility  Mobility Specialist Start Time (ACUTE ONLY) [id number]  Mobility Specialist Stop Time (ACUTE ONLY) 1020  Mobility Specialist Time Calculation (min) (ACUTE ONLY) 24 min    Pt was supine in bed upon entry on O2 @ 2L with guest in the room. Pt agreed to mobility. Pt is able to get to the EOB independently with no bed features. Pt is able to STS with a 2 [employer]. O2 vitals were taken throughout activity as a precaution. Pt ambulated well. Pt did take recovery breaks in order to check vitals. O2 vital remained above 88% throughout activity. After activity pt returned to the room to complete activities with guest in the room. Pt was left at the EOB with physician in the room.  Clem Rodes Mobility Specialist 04/05/24, 11:10 AM

## 2024-04-06 DIAGNOSIS — J9622 Acute and chronic respiratory failure with hypercapnia: Secondary | ICD-10-CM | POA: Diagnosis not present

## 2024-04-06 DIAGNOSIS — J9621 Acute and chronic respiratory failure with hypoxia: Secondary | ICD-10-CM | POA: Diagnosis not present

## 2024-04-06 LAB — BASIC METABOLIC PANEL WITH GFR
Anion gap: 9 (ref 5–15)
BUN: 22 mg/dL (ref 8–23)
CO2: 37 mmol/L — ABNORMAL HIGH (ref 22–32)
Calcium: 9.2 mg/dL (ref 8.9–10.3)
Chloride: 92 mmol/L — ABNORMAL LOW (ref 98–111)
Creatinine, Ser: 0.66 mg/dL (ref 0.61–1.24)
GFR, Estimated: >60 mL/min (ref >60)
Glucose, Bld: 146 mg/dL — ABNORMAL HIGH (ref 70–99)
Potassium: 4.2 mmol/L (ref 3.5–5.1)
Sodium: 138 mmol/L (ref 135–145)

## 2024-04-06 LAB — CBC
HCT: 55 % — ABNORMAL HIGH (ref 39.0–52.0)
Hemoglobin: 17.8 g/dL — ABNORMAL HIGH (ref 13.0–17.0)
MCH: 35.6 pg — ABNORMAL HIGH (ref 26.0–34.0)
MCHC: 32.4 g/dL (ref 30.0–36.0)
MCV: 110 fL — ABNORMAL HIGH (ref 80.0–100.0)
Platelets: 59 K/uL — ABNORMAL LOW (ref 150–400)
RBC: 5 MIL/uL (ref 4.22–5.81)
RDW: 13.7 % (ref 11.5–15.5)
WBC: 7.7 K/uL (ref 4.0–10.5)
nRBC: 0 % (ref 0.0–0.2)

## 2024-04-06 NOTE — Progress Notes (Signed)
 Progress Note    Miguel Howell  FMW:[id number] DOB: 12/16/1955  DOA: 03/25/2024 PCP: Auston Reyes BIRCH, MD      Brief Narrative:    Medical records reviewed and are as summarized below:  Miguel Howell is a 68 y.o. male with medical history significant for hypertension, hyperlipidemia, chronic HFpEF, COPD, who presented to the hospital with shortness of breath.  He had been feeling unwell for about 3 to 4 weeks prior to admission.  Shortness of breath had progressively gotten worse.  He also noted increasing sputum production.   He was found to have acute hypoxic and hypercapnic respiratory failure, CHF and COPD exacerbation.        Assessment/Plan:   Principal Problem:   Acute on chronic respiratory failure with hypoxia and hypercapnia (HCC) Active Problems:   Acute on chronic diastolic CHF (congestive heart failure) (HCC)   COPD exacerbation (HCC)   Hypokalemia   Lobar pneumonia   HTN (hypertension)   Acute kidney injury superimposed on CKD   Obesity (BMI 30-39.9)   Erythrocytosis   Thrombocytopenia   Acute urinary retention   Hypoxia   Thrush    Body mass index is 33.31 kg/m.  (Class I obesity)   Acute on chronic hypoxic and hypercapnic respiratory failure: Continue 2 L/min oxygen via [place].  Use BiPAP at night.     COPD exacerbation: Completed course of steroids.  Continue bronchodilators..   Acute on chronic diastolic CHF with anasarca: Improved.  S/p treatment with IV Lasix .  Net fluid loss 18.9 L thus far.  Weight loss from 252 to 225 pounds. Continue torsemide, spironolactone, Toprol and losartan.   Pneumonia: Completed antibiotics   Thrombocytopenia: Platelet count is stable.   Oral thrush: Improved with fluconazole.  Completed 7 days of fluconazole 04/06/2024    AKI on CKD stage IIIa: Improved Hypokalemia: Improved. Potassium supplement has been held for now.   Diet Order             Diet Heart Fluid consistency: Thin  Diet  effective now                                  Consultants: Pulmonologist Palliative care  Procedures: None    Medications:    amitriptyline  25 mg Oral QHS   aspirin  EC  81 mg Oral Daily   budesonide  (PULMICORT ) nebulizer solution  0.5 mg Nebulization BID   dapagliflozin propanediol  10 mg Oral Daily   docusate sodium   100 mg Oral BID   enoxaparin  (LOVENOX ) injection  0.5 mg/kg Subcutaneous Q24H   fluconazole  100 mg Oral Daily   fluticasone  furoate-vilanterol  1 puff Inhalation Daily   folic acid   1 mg Oral Daily   ipratropium-albuterol   3 mL Nebulization BID   losartan  25 mg Oral Daily   methylPREDNISolone  (SOLU-MEDROL ) injection  50 mg Intravenous Daily   metoprolol succinate  25 mg Oral Daily   montelukast   10 mg Oral Daily   nicotine   14 mg Transdermal Daily   pantoprazole  40 mg Oral Daily   spironolactone  25 mg Oral Daily   tamsulosin  0.4 mg Oral Daily   torsemide  40 mg Oral Daily   venlafaxine XR  75 mg Oral Q breakfast   Continuous Infusions:   Anti-infectives (From admission, onward)    Start     Dose/Rate Route Frequency Ordered Stop   03/31/24  1245  fluconazole (DIFLUCAN) tablet 100 mg        100 mg Oral Daily 03/31/24 1145     03/29/24 1800  fluconazole (DIFLUCAN) IVPB 100 mg  Status:  Discontinued        100 mg 50 mL/hr over 60 Minutes Intravenous Every 24 hours 03/28/24 1723 03/31/24 1145   03/29/24 1400  cefTRIAXone  (ROCEPHIN ) 2 g in sodium chloride  0.9 % 100 mL IVPB        2 g 200 mL/hr over 30 Minutes Intravenous Every 24 hours 03/29/24 1305 04/02/24 1111   03/28/24 1800  fluconazole (DIFLUCAN) IVPB 200 mg       Note to Pharmacy: thrush   200 mg 100 mL/hr over 60 Minutes Intravenous  Once 03/28/24 1648 03/28/24 1836   03/28/24 1800  fluconazole (DIFLUCAN) IVPB 100 mg  Status:  Discontinued        100 mg 50 mL/hr over 60 Minutes Intravenous Every 24 hours 03/28/24 1648 03/28/24 1723   03/26/24 0115  azithromycin   (ZITHROMAX ) 500 mg in sodium chloride  0.9 % 250 mL IVPB        500 mg 250 mL/hr over 60 Minutes Intravenous Every 24 hours 03/26/24 0102 03/28/24 0338              Family Communication/Anticipated D/C date and plan/Code Status   DVT prophylaxis: Place and maintain sequential compression device Start: 04/02/24 1508 Place TED hose Start: 03/28/24 1004     Code Status: Do not attempt resuscitation (DNR) PRE-ARREST INTERVENTIONS DESIRED  Family Communication: Plan discussed with Diane, sister, at the bedside Disposition Plan: Plan to discharge to SNF   Status is: Inpatient Remains inpatient appropriate because: Awaiting placement to SNF       Subjective:   Interval events noted.  No complaints.   Objective:    Vitals:   04/05/24 2316 04/06/24 0739 04/06/24 0830 04/06/24 1110  BP: 128/69  125/73 139/73  Pulse: 85  80 83  Resp: 19  13 20   Temp: 98.6 F (37 C)  98.5 F (36.9 C) 97.7 F (36.5 C)  TempSrc:   Oral   SpO2: 95% 98% 98% 96%  Weight:      Height:       No data found.   Intake/Output Summary (Last 24 hours) at 04/06/2024 1406 Last data filed at 04/06/2024 1013 Gross per 24 hour  Intake 120 ml  Output --  Net 120 ml   Filed Weights   04/03/24 0501 04/04/24 0500 04/05/24 0500  Weight: 101.3 kg 100.6 kg 102.3 kg    Exam:   GEN: NAD SKIN: Warm and dry EYES: No wheezing or rales at ENT: MMM CV: RRR PULM: CTA B ABD: soft, ND, NT, +BS CNS: AAO x 3, non focal EXT: No edema or tenderness       Data Reviewed:   I have personally reviewed following labs and imaging studies:  Labs: Labs show the following:   Basic Metabolic Panel: Recent Labs  Lab 04/02/24 0412 04/03/24 0440 04/04/24 0419 04/05/24 0545 04/05/24 0925 04/06/24 0517  NA 139 139 141 143  --  138  K 4.2 4.3 4.2 6.7* 4.2 4.2  CL 90* 91* 91* 90*  --  92*  CO2 38* 36* 38* 44*  --  37*  GLUCOSE 109* 108* 144* 125*  --  146*  BUN 35* 28* 25* 23  --  22  CREATININE  1.01 0.84 0.79 1.07  --  0.66  CALCIUM  9.6 9.3 9.3 9.8  --  9.2   GFR Estimated Creatinine Clearance: 107 mL/min (by C-G formula based on SCr of 0.66 mg/dL). Liver Function Tests: No results for input(s): AST, ALT, ALKPHOS, BILITOT, PROT, ALBUMIN in the last 168 hours. No results for input(s): LIPASE, AMYLASE in the last 168 hours. No results for input(s): AMMONIA in the last 168 hours. Coagulation profile No results for input(s): INR, PROTIME in the last 168 hours.  CBC: Recent Labs  Lab 04/02/24 0412 04/03/24 0440 04/04/24 0419 04/05/24 0545 04/06/24 0517  WBC 5.1 5.3 5.9 6.1 7.7  HGB 17.7* 18.0* 17.8* 18.2* 17.8*  HCT 57.8* 56.6* 56.5* 59.1* 55.0*  MCV 113.6* 113.0* 111.2* 113.9* 110.0*  PLT 70* 66* 68* 67* 59*   Cardiac Enzymes: No results for input(s): CKTOTAL, CKMB, CKMBINDEX, TROPONINI in the last 168 hours. BNP (last 3 results) No results for input(s): PROBNP in the last 8760 hours. CBG: No results for input(s): GLUCAP in the last 168 hours. D-Dimer: No results for input(s): DDIMER in the last 72 hours. Hgb A1c: No results for input(s): HGBA1C in the last 72 hours. Lipid Profile: No results for input(s): CHOL, HDL, LDLCALC, TRIG, CHOLHDL, LDLDIRECT in the last 72 hours. Thyroid  function studies: No results for input(s): TSH, T4TOTAL, T3FREE, THYROIDAB in the last 72 hours.  Invalid input(s): FREET3 Anemia work up: No results for input(s): VITAMINB12, FOLATE, FERRITIN, TIBC, IRON, RETICCTPCT in the last 72 hours. Sepsis Labs: Recent Labs  Lab 04/03/24 0440 04/04/24 0419 04/05/24 0545 04/06/24 0517  WBC 5.3 5.9 6.1 7.7    Microbiology Recent Results (from the past 240 hours)  Expectorated Sputum Assessment w Gram Stain, Rflx to Resp Cult     Status: None   Collection Time: 03/28/24  9:56 AM   Specimen: Sputum  Result Value Ref Range Status   Specimen Description SPUTUM  Final    Special Requests Normal  Final   Sputum evaluation   Final    THIS SPECIMEN IS ACCEPTABLE FOR SPUTUM CULTURE Performed at [facility], [address], [place], [place] [address]    Report Status 03/28/2024 FINAL  Final  Culture, Respiratory w Gram Stain     Status: None   Collection Time: 03/28/24  9:56 AM   Specimen: SPU  Result Value Ref Range Status   Specimen Description   Final    SPUTUM Performed at [facility], [address] [place], [place] [address]    Special Requests   Final    Normal Reflexed from [id number] Performed at [facility], [address] [place], [place] [address]    Gram Stain   Final    ABUNDANT SQUAMOUS EPITHELIAL CELLS PRESENT FEW WBC PRESENT, PREDOMINANTLY PMN ABUNDANT GRAM NEGATIVE RODS ABUNDANT GRAM POSITIVE COCCI IN PAIRS RARE YEAST WITH PSEUDOHYPHAE Performed at [facility], [address] [place], [place] [address]    Culture ABUNDANT SERRATIA MARCESCENS  Final   Report Status 03/30/2024 FINAL  Final   Organism ID, Bacteria SERRATIA MARCESCENS  Final      Susceptibility   Serratia marcescens - MIC*    CEFEPIME  <=0.12 SENSITIVE Sensitive     ERTAPENEM <=0.12 SENSITIVE Sensitive     CEFTRIAXONE  <=0.25 SENSITIVE Sensitive     CIPROFLOXACIN  0.12 SENSITIVE Sensitive     GENTAMICIN <=1 SENSITIVE Sensitive     MEROPENEM <=0.25 SENSITIVE Sensitive     TRIMETH/SULFA <=20 SENSITIVE Sensitive     * ABUNDANT SERRATIA MARCESCENS    Procedures and diagnostic studies:  No results found.  LOS: 12 days   Bella Brummet  [employer] Loss Adjuster on [email or web address]. If 7PM-7AM, please contact night-coverage at [email or web address]     04/06/2024, 2:06 PM

## 2024-04-06 NOTE — Progress Notes (Signed)
 Physical Therapy Treatment Patient Details Name: Miguel Howell MRN: [id number] DOB: 1956/05/25 Today's Date: 04/06/2024   History of Present Illness Patient is a 68 year old male with worsening shortness of breath. PMH:  HTN, HLD, HFpEF, COPD    PT Comments  Pt ready for session.  In and out of bed with no assist.  Cues to push from bed and not pull up on walker.  He is able to progress gait 36' with RW and cga x 1.  Le's with generally increasing weakness but no buckling noted.  Pt is cued by writer to turn back due to visible signs of fatigue and while he did make it safely to bed, he did state he would have walked further than he should have if left to his own.  Education regarding awareness and multiple short walks vs one longer one.  Voiced understanding.  Sats >90 on 2 lpm.  L hand does seem to read better/higher than R.    RN stated after session pt and sister are considering discharge home if no SNF bed is available tomorrow. Would still recommend SNF if available to pt.     If plan is discharge home, recommend the following: A little help with walking and/or transfers;A little help with bathing/dressing/bathroom;Assist for transportation;Help with stairs or ramp for entrance;Assistance with cooking/housework   Can travel by private vehicle     No  Equipment Recommendations       Recommendations for Other Services       Precautions / Restrictions Precautions Precautions: Fall Recall of Precautions/Restrictions: Intact Restrictions Weight Bearing Restrictions Per Provider Order: No Other Position/Activity Restrictions: monitor O2     Mobility  Bed Mobility Overal bed mobility: Modified Independent Bed Mobility: Supine to Sit, Sit to Supine     Supine to sit: Modified independent (Device/Increase time) Sit to supine: Modified independent (Device/Increase time)     Patient Response: Cooperative  Transfers Overall transfer level: Needs assistance Equipment used:  Rolling walker (2 wheels) Transfers: Sit to/from Stand Sit to Stand: Contact guard assist           General transfer comment: cues for hand placements    Ambulation/Gait Ambulation/Gait assistance: Contact guard assist Gait Distance (Feet): 80 Feet Assistive device: Rolling walker (2 wheels) Gait Pattern/deviations: Decreased stride length, Step-to pattern, Narrow base of support Gait velocity: decreased     General Gait Details: cued to turn by clinical research associate.  would have over estimated walking distance if left to chose on his own and struggled to return   Optometrist     Tilt Bed Tilt Bed Patient Response: Cooperative  Modified Rankin (Stroke Patients Only)       Balance Overall balance assessment: Needs assistance Sitting-balance support: Feet supported Sitting balance-Leahy Scale: Good     Standing balance support: Bilateral upper extremity supported Standing balance-Leahy Scale: Fair Standing balance comment: UE support on RW                            Communication Communication Communication: Impaired  Cognition Arousal: Alert Behavior During Therapy: WFL for tasks assessed/performed   PT - Cognitive impairments: Safety/Judgement Difficult to assess due to: Impaired communication                     PT - Cognition Comments: soft voice, oversestimates abilities Following commands: Intact Following commands impaired:  Only follows one step commands consistently    Cueing Cueing Techniques: Verbal cues  Exercises      General Comments        Pertinent Vitals/Pain Pain Assessment Pain Assessment: No/denies pain    Home Living                          Prior Function            PT Goals (current goals can now be found in the care plan section) Progress towards PT goals: Progressing toward goals    Frequency    Min 2X/week      PT Plan      Co-evaluation               AM-PAC PT 6 Clicks Mobility   Outcome Measure  Help needed turning from your back to your side while in a flat bed without using bedrails?: None Help needed moving from lying on your back to sitting on the side of a flat bed without using bedrails?: None Help needed moving to and from a bed to a chair (including a wheelchair)?: A Little Help needed standing up from a chair using your arms (e.g., wheelchair or bedside chair)?: A Little Help needed to walk in hospital room?: A Little Help needed climbing 3-5 steps with a railing? : A Little 6 Click Score: 20    End of Session Equipment Utilized During Treatment: Gait belt;Oxygen Activity Tolerance: Patient limited by fatigue Patient left: in bed;with bed alarm set;with call bell/phone within reach Nurse Communication: Mobility status PT Visit Diagnosis: Muscle weakness (generalized) (M62.81);Unsteadiness on feet (R26.81)     Time: 9061-9047 PT Time Calculation (min) (ACUTE ONLY): 14 min  Charges:    $Gait Training: 8-22 mins PT General Charges $$ ACUTE PT VISIT: 1 Visit                   Lauraine Gills, PTA 04/06/24, 10:17 AM

## 2024-04-06 NOTE — Plan of Care (Signed)

## 2024-04-06 NOTE — Progress Notes (Signed)
 Mobility Specialist - Progress Note  Pre-mobility: HR-84,  SpO2-95%  During mobility: HR-85,  SpO2-94%  Post-mobility: HR-89,  SPO2-95%   04/06/24 1200  Mobility  Activity Ambulated with assistance;Stood at bedside  Level of Assistance Contact guard assist, steadying assist  Assistive Device Front wheel walker  Distance Ambulated (ft) 100 ft  Range of Motion/Exercises All extremities  Activity Response Tolerated well  Mobility visit 1 Mobility  Mobility Specialist Start Time (ACUTE ONLY) 1124  Mobility Specialist Stop Time (ACUTE ONLY) 1143  Mobility Specialist Time Calculation (min) (ACUTE ONLY) 19 min   Pt was supine in bed on O2 @ 2L with guest in the room. Pt agreed to mobility. Pt is able today to get to the EOB independently with no bed features. Pt is able to STS independently with 2 WW. Pt ambulated well. Pt did need recovery break to check vitals. Pt O2 vitals remained above 89% throughout activity. After activity pt returned to the room in bed with needs in reach. Guest remained in the room.    Clem Rodes Mobility Specialist 04/06/24, 12:57 PM

## 2024-04-07 ENCOUNTER — Inpatient Hospital Stay: Admitting: Family

## 2024-04-07 DIAGNOSIS — J9601 Acute respiratory failure with hypoxia: Secondary | ICD-10-CM | POA: Diagnosis not present

## 2024-04-07 DIAGNOSIS — J9602 Acute respiratory failure with hypercapnia: Secondary | ICD-10-CM | POA: Diagnosis not present

## 2024-04-07 LAB — BASIC METABOLIC PANEL WITH GFR
Anion gap: 10 (ref 5–15)
BUN: 25 mg/dL — ABNORMAL HIGH (ref 8–23)
CO2: 36 mmol/L — ABNORMAL HIGH (ref 22–32)
Calcium: 8.9 mg/dL (ref 8.9–10.3)
Chloride: 91 mmol/L — ABNORMAL LOW (ref 98–111)
Creatinine, Ser: 0.9 mg/dL (ref 0.61–1.24)
GFR, Estimated: >60 mL/min (ref >60)
Glucose, Bld: 124 mg/dL — ABNORMAL HIGH (ref 70–99)
Potassium: 4.2 mmol/L (ref 3.5–5.1)
Sodium: 137 mmol/L (ref 135–145)

## 2024-04-07 LAB — CBC
HCT: 53.3 % — ABNORMAL HIGH (ref 39.0–52.0)
Hemoglobin: 17.1 g/dL — ABNORMAL HIGH (ref 13.0–17.0)
MCH: 34.7 pg — ABNORMAL HIGH (ref 26.0–34.0)
MCHC: 32.1 g/dL (ref 30.0–36.0)
MCV: 108.1 fL — ABNORMAL HIGH (ref 80.0–100.0)
Platelets: 61 K/uL — ABNORMAL LOW (ref 150–400)
RBC: 4.93 MIL/uL (ref 4.22–5.81)
RDW: 13.7 % (ref 11.5–15.5)
WBC: 6.7 K/uL (ref 4.0–10.5)
nRBC: 0 % (ref 0.0–0.2)

## 2024-04-07 MED ORDER — METHYLPREDNISOLONE SODIUM SUCC 40 MG IJ SOLR
40.0000 mg | Freq: Every day | INTRAMUSCULAR | Status: DC
  Administered 2024-04-07 – 2024-04-08 (×2): 40 mg via INTRAVENOUS
  Filled 2024-04-07 (×2): qty 1

## 2024-04-07 NOTE — Plan of Care (Signed)

## 2024-04-07 NOTE — Progress Notes (Signed)
 PROGRESS NOTE    Miguel Howell  FMW:[id number] DOB: 07/08/1955 DOA: 03/25/2024 PCP: Auston Reyes BIRCH, MD    Assessment & Plan:   Principal Problem:   Acute on chronic respiratory failure with hypoxia and hypercapnia (HCC) Active Problems:   Acute on chronic diastolic CHF (congestive heart failure) (HCC)   COPD exacerbation (HCC)   Hypokalemia   Lobar pneumonia   HTN (hypertension)   Acute kidney injury superimposed on CKD   Obesity (BMI 30-39.9)   Erythrocytosis   Thrombocytopenia   Acute urinary retention   Hypoxia   Thrush  Assessment and Plan: Acute hypoxic & hypercapnic respiratory failure: continue on supplemental oxygen and wean as tolerated. BiPAP prn. Does not use oxygen at home. Will d/c w/ supplemental oxygen    Acute on chronic diastolic CHF: with anasarca.  EF 50 to 55%.  Continue on torsemide, metoprolol, losartan, aldactone, jardiance. Monitor I/Os    COPD exacerbation: continue on steroid taper, bronchodilators & encourage incentive spirometry    Lobar pneumonia: completed abx course. Continue on steroid taper and continue on bronchodilators. Encourage incentive spirometry    Hypokalemia: WNL today    HTN: continue on metoprolol, aldactone, losartan    AKI on CKDIIIa: Cr is better than baseline    Obesity: BMI 33.6. Would benefit from weight loss   Thrush: resolved. Completed fluconazole course    Acute urinary retention: continue on flomax    Thrombocytopenia: labile. Etiology unclear. Will continue to monitor    Erythrocytosis: likely secondary to chronic hypoxia.JAK2 is still pending. Erythropoietin level actually low.      DVT prophylaxis: lovenox   Code Status: DNR Family Communication: discussed pt's care w/ pt's sister at bedside and answered her questions  Disposition Plan: waiting on final decision from SNF as per CM  Level of care: Telemetry Cardiac  Status is: Inpatient Remains inpatient appropriate because: medically stable,  waiting on final decision from SNF as per CM     Consultants:    Procedures:   Antimicrobials:    Subjective: Pt c/o malaise  Objective: Vitals:   04/07/24 0335 04/07/24 0435 04/07/24 0439 04/07/24 0804  BP: (!) 141/88 139/85  114/74  Pulse: 78 72  87  Resp: 18 18  (!) 23  Temp: 98.3 F (36.8 C) 98 F (36.7 C)  98 F (36.7 C)  TempSrc:  Oral  Oral  SpO2: 98% 99%  93%  Weight:   101.4 kg   Height:        Intake/Output Summary (Last 24 hours) at 04/07/2024 0946 Last data filed at 04/07/2024 0804 Gross per 24 hour  Intake 120 ml  Output 3000 ml  Net -2880 ml   Filed Weights   04/04/24 0500 04/05/24 0500 04/07/24 0439  Weight: 100.6 kg 102.3 kg 101.4 kg    Examination:  General exam: Appears calm and comfortable  Respiratory system: Clear to auscultation. Respiratory effort normal. Cardiovascular system: S1 & S2 +. No  rubs, gallops or clicks.  Gastrointestinal system: Abdomen is obese, soft and nontender. Normal bowel sounds heard. Central nervous system: Alert and oriented. Moves all extremities Psychiatry: Judgement and insight appear normal. Flat mood and affect    Data Reviewed: I have personally reviewed following labs and imaging studies  CBC: Recent Labs  Lab 04/03/24 0440 04/04/24 0419 04/05/24 0545 04/06/24 0517 04/07/24 0337  WBC 5.3 5.9 6.1 7.7 6.7  HGB 18.0* 17.8* 18.2* 17.8* 17.1*  HCT 56.6* 56.5* 59.1* 55.0* 53.3*  MCV 113.0* 111.2* 113.9* 110.0* 108.1*  PLT 66* 68* 67* 59* 61*   Basic Metabolic Panel: Recent Labs  Lab 04/03/24 0440 04/04/24 0419 04/05/24 0545 04/05/24 0925 04/06/24 0517 04/07/24 0337  NA 139 141 143  --  138 137  K 4.3 4.2 6.7* 4.2 4.2 4.2  CL 91* 91* 90*  --  92* 91*  CO2 36* 38* 44*  --  37* 36*  GLUCOSE 108* 144* 125*  --  146* 124*  BUN 28* 25* 23  --  22 25*  CREATININE 0.84 0.79 1.07  --  0.66 0.90  CALCIUM  9.3 9.3 9.8  --  9.2 8.9   GFR: Estimated Creatinine Clearance: 94.8 mL/min (by C-G  formula based on SCr of 0.9 mg/dL). Liver Function Tests: No results for input(s): AST, ALT, ALKPHOS, BILITOT, PROT, ALBUMIN in the last 168 hours. No results for input(s): LIPASE, AMYLASE in the last 168 hours. No results for input(s): AMMONIA in the last 168 hours. Coagulation Profile: No results for input(s): INR, PROTIME in the last 168 hours. Cardiac Enzymes: No results for input(s): CKTOTAL, CKMB, CKMBINDEX, TROPONINI in the last 168 hours. BNP (last 3 results) No results for input(s): PROBNP in the last 8760 hours. HbA1C: No results for input(s): HGBA1C in the last 72 hours. CBG: No results for input(s): GLUCAP in the last 168 hours. Lipid Profile: No results for input(s): CHOL, HDL, LDLCALC, TRIG, CHOLHDL, LDLDIRECT in the last 72 hours. Thyroid  Function Tests: No results for input(s): TSH, T4TOTAL, FREET4, T3FREE, THYROIDAB in the last 72 hours. Anemia Panel: No results for input(s): VITAMINB12, FOLATE, FERRITIN, TIBC, IRON, RETICCTPCT in the last 72 hours. Sepsis Labs: No results for input(s): PROCALCITON, LATICACIDVEN in the last 168 hours.  No results found for this or any previous visit (from the past 240 hours).       Radiology Studies: No results found.      Scheduled Meds:  amitriptyline  25 mg Oral QHS   aspirin  EC  81 mg Oral Daily   budesonide  (PULMICORT ) nebulizer solution  0.5 mg Nebulization BID   dapagliflozin propanediol  10 mg Oral Daily   docusate sodium   100 mg Oral BID   enoxaparin  (LOVENOX ) injection  0.5 mg/kg Subcutaneous Q24H   fluticasone  furoate-vilanterol  1 puff Inhalation Daily   folic acid   1 mg Oral Daily   ipratropium-albuterol   3 mL Nebulization BID   losartan  25 mg Oral Daily   methylPREDNISolone  (SOLU-MEDROL ) injection  50 mg Intravenous Daily   metoprolol succinate  25 mg Oral Daily   montelukast   10 mg Oral Daily   nicotine   14 mg Transdermal Daily    pantoprazole  40 mg Oral Daily   spironolactone  25 mg Oral Daily   tamsulosin  0.4 mg Oral Daily   torsemide  40 mg Oral Daily   venlafaxine XR  75 mg Oral Q breakfast   Continuous Infusions:   LOS: 13 days      Anthony CHRISTELLA Pouch, MD [facility] Pager [phone]  If 7PM-7AM, please contact night-coverage [email or web address] 04/07/2024, 9:46 AM

## 2024-04-07 NOTE — Plan of Care (Signed)

## 2024-04-07 NOTE — TOC Progression Note (Addendum)
 Transition of Care [facility]) - Progression Note    Patient Details  Name: Miguel Howell MRN: [id number] Date of Birth: 1956/01/29  [facility] CM/SW Contact  Lauraine [name], LCSW Phone Number: 04/07/2024, 9:44 AM  Clinical Narrative:   [name] admissions coordinator will check with nursing regarding decision.  4:01 pm: [name] can offer a bed. They are ordering his bipap. Patient and sister are aware. CSW left voicemail for [employer]. Will start auth when they call back.  Expected Discharge Plan and Services                                               Social Drivers of Health (SDOH) Interventions SDOH Screenings   Food Insecurity: No Food Insecurity (03/27/2024)  Housing: Low Risk  (03/27/2024)  Transportation Needs: No Transportation Needs (03/27/2024)  Utilities: Not At Risk (03/27/2024)  Financial Resource Strain: Low Risk  (05/07/2023)   Received from [facility]  Social Connections: Unknown (03/27/2024)  Tobacco Use: High Risk (03/25/2024)    Readmission Risk Interventions     No data to display

## 2024-04-07 NOTE — Progress Notes (Signed)
 Physical Therapy Treatment Patient Details Name: Miguel Howell MRN: [id number] DOB: [birth year] Today's Date: 04/07/2024   History of Present Illness Patient is a 68 year old male with worsening shortness of breath. PMH:  HTN, HLD, HFpEF, COPD    PT Comments  Pt with no assist for bed mobility but does use bed features.  He stands with min cues from sister for hand placements and walks 130' with RW and cga x 1.  Gait overall improved today with increased strength, tolerance and confidence.  He shows more awareness of safe gait distances.  Rests EOB while bed is changed.  He opts not to walk again at this time wanting to rest.  Mobility team to plan to return later today to walk again per his request.   If plan is discharge home, recommend the following: A little help with walking and/or transfers;A little help with bathing/dressing/bathroom;Assist for transportation;Help with stairs or ramp for entrance;Assistance with cooking/housework   Can travel by private vehicle     No  Equipment Recommendations       Recommendations for Other Services       Precautions / Restrictions Precautions Precautions: Fall Recall of Precautions/Restrictions: Intact Restrictions Weight Bearing Restrictions Per Provider Order: No Other Position/Activity Restrictions: monitor O2     Mobility  Bed Mobility Overal bed mobility: Modified Independent               Patient Response: Cooperative  Transfers Overall transfer level: Needs assistance Equipment used: Rolling walker (2 wheels) Transfers: Sit to/from Stand Sit to Stand: Contact guard assist           General transfer comment: cues for hand placements    Ambulation/Gait Ambulation/Gait assistance: Contact guard assist Gait Distance (Feet): 130 Feet Assistive device: Rolling walker (2 wheels) Gait Pattern/deviations: Decreased stride length, Step-to pattern, Narrow base of support Gait velocity: decreased     General Gait  Details: self selects proper gait distance today based on fatigue level   Stairs             Wheelchair Mobility     Tilt Bed Tilt Bed Patient Response: Cooperative  Modified Rankin (Stroke Patients Only)       Balance Overall balance assessment: Needs assistance Sitting-balance support: Feet supported Sitting balance-Leahy Scale: Good     Standing balance support: Bilateral upper extremity supported Standing balance-Leahy Scale: Fair Standing balance comment: UE support on RW                            Communication Communication Communication: Impaired Factors Affecting Communication: Reduced clarity of speech (very soft voice)  Cognition Arousal: Alert Behavior During Therapy: WFL for tasks assessed/performed   PT - Cognitive impairments: No apparent impairments Difficult to assess due to: Impaired communication                     PT - Cognition Comments: better awareness of abilities today Following commands: Intact Following commands impaired: Only follows one step commands consistently    Cueing Cueing Techniques: Verbal cues  Exercises      General Comments        Pertinent Vitals/Pain Pain Assessment Pain Assessment: No/denies pain    Home Living                          Prior Function  PT Goals (current goals can now be found in the care plan section) Progress towards PT goals: Progressing toward goals    Frequency    Min 2X/week      PT Plan      Co-evaluation              AM-PAC PT 6 Clicks Mobility   Outcome Measure  Help needed turning from your back to your side while in a flat bed without using bedrails?: None Help needed moving from lying on your back to sitting on the side of a flat bed without using bedrails?: None Help needed moving to and from a bed to a chair (including a wheelchair)?: A Little Help needed standing up from a chair using your arms (e.g.,  wheelchair or bedside chair)?: A Little Help needed to walk in hospital room?: A Little Help needed climbing 3-5 steps with a railing? : A Little 6 Click Score: 20    End of Session Equipment Utilized During Treatment: Gait belt;Oxygen Activity Tolerance: Patient limited by fatigue Patient left: in bed;with bed alarm set;with call bell/phone within reach Nurse Communication: Mobility status PT Visit Diagnosis: Muscle weakness (generalized) (M62.81);Unsteadiness on feet (R26.81)     Time: 9096-9083 PT Time Calculation (min) (ACUTE ONLY): 13 min  Charges:    $Gait Training: 8-22 mins PT General Charges $$ ACUTE PT VISIT: 1 Visit                   Lauraine Gills, PTA 04/07/24, 9:26 AM

## 2024-04-07 NOTE — Progress Notes (Signed)
 Occupational Therapy Treatment Patient Details Name: Miguel Howell MRN: [id number] DOB: [birth year] Today's Date: 04/07/2024   History of present illness Patient is a 68 year old male with worsening shortness of breath. PMH:  HTN, HLD, HFpEF, COPD   OT comments  Pt seen for OT tx. Pt agreeable to session, denied pain or SOB. Pt required overall supervision to SBA for bed mobility, transfers, and mobility to/from the bathroom with the RW. Increased effort to stand from EOB and from standard toilet but no direct assist required. Pt tolerated session well. Instructed in activity pacing, work simplification, and AE/DME for home ADL/IADL. Pt verbalized understanding. Continues to benefit from skilled OT services.       If plan is discharge home, recommend the following:  A little help with walking and/or transfers;A little help with bathing/dressing/bathroom;Help with stairs or ramp for entrance;Assist for transportation;Assistance with cooking/housework   Equipment Recommendations  None recommended by OT    Recommendations for Other Services      Precautions / Restrictions Precautions Precautions: Fall Recall of Precautions/Restrictions: Intact Restrictions Weight Bearing Restrictions Per Provider Order: No Other Position/Activity Restrictions: monitor O2       Mobility Bed Mobility Overal bed mobility: Modified Independent Bed Mobility: Supine to Sit, Sit to Supine     Supine to sit: Modified independent (Device/Increase time) Sit to supine: Modified independent (Device/Increase time)   General bed mobility comments: increased effort    Transfers Overall transfer level: Needs assistance Equipment used: Rolling walker (2 wheels) Transfers: Sit to/from Stand Sit to Stand: Supervision           General transfer comment: increased effort     Balance Overall balance assessment: Needs assistance Sitting-balance support: Feet supported Sitting balance-Leahy Scale: Good      Standing balance support: During functional activity, No upper extremity supported Standing balance-Leahy Scale: Fair                             ADL either performed or assessed with clinical judgement   ADL Overall ADL's : Needs assistance/impaired     Grooming: Standing;Supervision/safety;Wash/dry Programmer, Applications Details (indicate cue type and reason): sink level                 Toilet Transfer: Ambulation;Regular Toilet;Contact guard assist;Rolling walker (2 wheels) Toilet Transfer Details (indicate cue type and reason): increased effort to stand but no direct assist required Toileting- Clothing Manipulation and Hygiene: Sitting/lateral lean;Sit to/from stand;Supervision/safety       Functional mobility during ADLs: Supervision/safety;Rolling walker (2 wheels)      Extremity/Trunk Assessment              Vision       Perception     Praxis     Communication Communication Communication: Impaired Factors Affecting Communication: Reduced clarity of speech (very soft voice)   Cognition Arousal: Alert Behavior During Therapy: WFL for tasks assessed/performed Cognition: No apparent impairments                               Following commands: Intact        Cueing      Exercises Other Exercises Other Exercises: Pt edu in ECS and activity pacing, work simplification, sitting versus standing for long periods of time, falls prevention    Shoulder Instructions       General Comments on 2L at start of session,  removed for going to the bathroom, desat to 85% and came back up quickly to >92%    Pertinent Vitals/ Pain       Pain Assessment Pain Assessment: No/denies pain  Home Living                                          Prior Functioning/Environment              Frequency  Min 2X/week        Progress Toward Goals  OT Goals(current goals can now be found in the care plan section)  Progress  towards OT goals: Progressing toward goals  Acute Rehab OT Goals Patient Stated Goal: improve strength OT Goal Formulation: With patient Time For Goal Achievement: 04/13/24 Potential to Achieve Goals: Good  Plan      Co-evaluation                 AM-PAC OT 6 Clicks Daily Activity     Outcome Measure   Help from another person eating meals?: None Help from another person taking care of personal grooming?: None Help from another person toileting, which includes using toliet, bedpan, or urinal?: A Little Help from another person bathing (including washing, rinsing, drying)?: A Little Help from another person to put on and taking off regular upper body clothing?: None Help from another person to put on and taking off regular lower body clothing?: A Little 6 Click Score: 21    End of Session Equipment Utilized During Treatment: Rolling walker (2 wheels);Oxygen  OT Visit Diagnosis: Other abnormalities of gait and mobility (R26.89);Muscle weakness (generalized) (M62.81)   Activity Tolerance Patient tolerated treatment well   Patient Left in bed;with call bell/phone within reach;with bed alarm set   Nurse Communication          Time: 8858-8845 OT Time Calculation (min): 13 min  Charges: OT General Charges $OT Visit: 1 Visit OT Treatments $Self Care/Home Management : 8-22 mins  Warren SAUNDERS., MPH, MS, OTR/L ascom [phone] 04/07/24, 1:00 PM

## 2024-04-08 DIAGNOSIS — K219 Gastro-esophageal reflux disease without esophagitis: Secondary | ICD-10-CM | POA: Diagnosis not present

## 2024-04-08 DIAGNOSIS — J9601 Acute respiratory failure with hypoxia: Secondary | ICD-10-CM | POA: Diagnosis not present

## 2024-04-08 DIAGNOSIS — E6609 Other obesity due to excess calories: Secondary | ICD-10-CM | POA: Diagnosis not present

## 2024-04-08 DIAGNOSIS — J9602 Acute respiratory failure with hypercapnia: Secondary | ICD-10-CM | POA: Diagnosis not present

## 2024-04-08 DIAGNOSIS — M1389 Other specified arthritis, multiple sites: Secondary | ICD-10-CM | POA: Diagnosis not present

## 2024-04-08 DIAGNOSIS — R260 Ataxic gait: Secondary | ICD-10-CM | POA: Diagnosis not present

## 2024-04-08 DIAGNOSIS — I5033 Acute on chronic diastolic (congestive) heart failure: Secondary | ICD-10-CM | POA: Diagnosis not present

## 2024-04-08 DIAGNOSIS — J9622 Acute and chronic respiratory failure with hypercapnia: Secondary | ICD-10-CM | POA: Diagnosis not present

## 2024-04-08 LAB — BASIC METABOLIC PANEL WITH GFR
Anion gap: 9 (ref 5–15)
BUN: 27 mg/dL — ABNORMAL HIGH (ref 8–23)
CO2: 38 mmol/L — ABNORMAL HIGH (ref 22–32)
Calcium: 9.4 mg/dL (ref 8.9–10.3)
Chloride: 92 mmol/L — ABNORMAL LOW (ref 98–111)
Creatinine, Ser: 1.03 mg/dL (ref 0.61–1.24)
GFR, Estimated: >60 mL/min (ref >60)
Glucose, Bld: 124 mg/dL — ABNORMAL HIGH (ref 70–99)
Potassium: 5 mmol/L (ref 3.5–5.1)
Sodium: 139 mmol/L (ref 135–145)

## 2024-04-08 LAB — CBC
HCT: 54 % — ABNORMAL HIGH (ref 39.0–52.0)
Hemoglobin: 17.2 g/dL — ABNORMAL HIGH (ref 13.0–17.0)
MCH: 35.1 pg — ABNORMAL HIGH (ref 26.0–34.0)
MCHC: 31.9 g/dL (ref 30.0–36.0)
MCV: 110.2 fL — ABNORMAL HIGH (ref 80.0–100.0)
Platelets: 62 K/uL — ABNORMAL LOW (ref 150–400)
RBC: 4.9 MIL/uL (ref 4.22–5.81)
RDW: 13.8 % (ref 11.5–15.5)
WBC: 7.5 K/uL (ref 4.0–10.5)
nRBC: 0 % (ref 0.0–0.2)

## 2024-04-08 MED ORDER — DAPAGLIFLOZIN PROPANEDIOL 10 MG PO TABS: 10.0000 mg | ORAL_TABLET | Freq: Every day | ORAL | 0 refills | Status: AC

## 2024-04-08 MED ORDER — PREDNISONE 10 MG PO TABS: ORAL_TABLET | ORAL | 0 refills | Status: AC

## 2024-04-08 MED ORDER — TAMSULOSIN HCL 0.4 MG PO CAPS: 0.4000 mg | ORAL_CAPSULE | Freq: Every day | ORAL | 0 refills | Status: AC

## 2024-04-08 MED ORDER — METOPROLOL SUCCINATE ER 25 MG PO TB24: 25.0000 mg | ORAL_TABLET | Freq: Every day | ORAL | 0 refills | Status: AC

## 2024-04-08 MED ORDER — DIAZEPAM 10 MG PO TABS: 10.0000 mg | ORAL_TABLET | Freq: Every evening | ORAL | 0 refills | Status: AC | PRN

## 2024-04-08 MED ORDER — LOSARTAN POTASSIUM 25 MG PO TABS: 25.0000 mg | ORAL_TABLET | Freq: Every day | ORAL | 0 refills | Status: AC

## 2024-04-08 MED ORDER — SPIRONOLACTONE 25 MG PO TABS: 25.0000 mg | ORAL_TABLET | Freq: Every day | ORAL | 0 refills | Status: AC

## 2024-04-08 NOTE — Plan of Care (Signed)

## 2024-04-08 NOTE — TOC Progression Note (Addendum)
 Transition of Care [facility]) - Progression Note    Patient Details  Name: Miguel Howell MRN: [id number] Date of Birth: [birth year]  Transition of Care [facility]) CM/SW Contact  Lauraine [name], LCSW Phone Number: 04/08/2024, 8:35 AM  Clinical Narrative: No call back from [facility]. CSW left another voicemail. Will start SNF auth once they call back.  8:47 am: Received call back from Tammy at [facility]. Started auth.  10:27 am: Auth approved: [id number]. [facility] admissions coordinator is aware. She stated they can get bipap's same-day. CSW checking to see if we can still provide loner tanks for transport to SNF.  12:57 pm: Patient has an oxygen tank at home that he can use for the transport. SNF needs dc summary before 3:00. Sent secure chat to MD to notify.  Expected Discharge Plan and Services                                               Social Drivers of Health (SDOH) Interventions SDOH Screenings   Food Insecurity: No Food Insecurity (03/27/2024)  Housing: Low Risk  (03/27/2024)  Transportation Needs: No Transportation Needs (03/27/2024)  Utilities: Not At Risk (03/27/2024)  Financial Resource Strain: Low Risk  (05/07/2023)   Received from [facility]  Social Connections: Unknown (03/27/2024)  Tobacco Use: High Risk (03/25/2024)    Readmission Risk Interventions     No data to display

## 2024-04-08 NOTE — Progress Notes (Signed)
 Physical Therapy Treatment Patient Details Name: Miguel Howell MRN: [id number] DOB: [birth year] Today's Date: 04/08/2024   History of Present Illness Patient is a 68 year old male with worsening shortness of breath. PMH:  HTN, HLD, HFpEF, COPD    PT Comments  Pt remains motivated to participate.  Bed mobility without assist but does use bed features.  He stands with cga x 1 and is able to increase gait to 150' on unit with several full turns for directional changes.  Overall gait distance and tolerance is improving daily.  Pt continues with general LE weakness that does negatively affect balance and increases fall risk.  Dec step height and length along with some decreased dorsiflexion with gait.  Discharge recommendations remain appropriate.  He hopes to discharge to SNF today.   If plan is discharge home, recommend the following: A little help with walking and/or transfers;A little help with bathing/dressing/bathroom;Assist for transportation;Help with stairs or ramp for entrance;Assistance with cooking/housework   Can travel by private vehicle        Equipment Recommendations  None recommended by PT    Recommendations for Other Services       Precautions / Restrictions Precautions Precautions: Fall Recall of Precautions/Restrictions: Intact Restrictions Weight Bearing Restrictions Per Provider Order: No Other Position/Activity Restrictions: monitor O2     Mobility  Bed Mobility Overal bed mobility: Modified Independent               Patient Response: Cooperative  Transfers Overall transfer level: Needs assistance Equipment used: Rolling walker (2 wheels) Transfers: Sit to/from Stand Sit to Stand: Supervision                Ambulation/Gait Ambulation/Gait assistance: Contact guard assist Gait Distance (Feet): 150 Feet Assistive device: Rolling walker (2 wheels) Gait Pattern/deviations: Decreased stride length, Step-to pattern, Narrow base of support Gait  velocity: decreased     General Gait Details: self selects proper gait distance today based on fatigue level   Stairs             Wheelchair Mobility     Tilt Bed Tilt Bed Patient Response: Cooperative  Modified Rankin (Stroke Patients Only)       Balance Overall balance assessment: Needs assistance Sitting-balance support: Feet supported Sitting balance-Leahy Scale: Good     Standing balance support: During functional activity, No upper extremity supported Standing balance-Leahy Scale: Fair Standing balance comment: UE support on RW                            Communication Communication Communication: Impaired Factors Affecting Communication: Reduced clarity of speech  Cognition Arousal: Alert Behavior During Therapy: WFL for tasks assessed/performed   PT - Cognitive impairments: No apparent impairments Difficult to assess due to: Impaired communication (soft voice)                     PT - Cognition Comments: better awareness of abilities today Following commands: Intact Following commands impaired: Only follows one step commands consistently    Cueing    Exercises      General Comments        Pertinent Vitals/Pain Pain Assessment Pain Assessment: No/denies pain    Home Living                          Prior Function            PT  Goals (current goals can now be found in the care plan section) Progress towards PT goals: Progressing toward goals    Frequency    Min 2X/week      PT Plan      Co-evaluation              AM-PAC PT 6 Clicks Mobility   Outcome Measure  Help needed turning from your back to your side while in a flat bed without using bedrails?: None Help needed moving from lying on your back to sitting on the side of a flat bed without using bedrails?: None Help needed moving to and from a bed to a chair (including a wheelchair)?: A Little Help needed standing up from a chair  using your arms (e.g., wheelchair or bedside chair)?: A Little Help needed to walk in hospital room?: A Little Help needed climbing 3-5 steps with a railing? : A Little 6 Click Score: 20    End of Session Equipment Utilized During Treatment: Gait belt;Oxygen Activity Tolerance: Patient limited by fatigue Patient left: in bed;with bed alarm set;with call bell/phone within reach Nurse Communication: Mobility status PT Visit Diagnosis: Muscle weakness (generalized) (M62.81);Unsteadiness on feet (R26.81)     Time: 9059-9050 PT Time Calculation (min) (ACUTE ONLY): 9 min  Charges:    $Gait Training: 8-22 mins PT General Charges $$ ACUTE PT VISIT: 1 Visit                    Lauraine Gills, PTA 04/08/24, 9:54 AM

## 2024-04-08 NOTE — Discharge Summary (Addendum)
 Physician Discharge Summary  Miguel Howell FMW:[id number] DOB: [birth year] DOA: 03/25/2024  PCP: Auston Reyes BIRCH, MD  Admit date: 03/25/2024 Discharge date: 04/08/2024  Admitted From: home Disposition:  SNF  Recommendations for Outpatient Follow-up:  Follow up with PCP in 1-2 weeks F/u w/ cardio in 1-2 weeks  Home Health: ni  Equipment/Devices: 2L [place]  Discharge Condition: stable CODE STATUS:DNR Diet recommendation: Heart Healthy  Brief/Interim Summary: HPI was taken from Dr. Fernand: Miguel Howell is a 68 y.o. year old male with past medical history of HTN, HLD, HFpEF, COPD presenting to the ED with worsening shortness of breath.      Pt reports he has been feeling unwell for 3-4 weeks. He has been getting more short of breath but this improves and then gets worse. Over the last few days it has been getting worse.  Patient also has worsening sputum production.     ED Course: On arrival to the ED patient was noted to be HDS stable. CBC without leukocytosis, but macrocytosis and erythrocytosis. BMP with hyponatremia, AKI and elevated bicarb. CXR without any acute findings.      TRH contacted for admission.  Discharge Diagnoses:  Principal Problem:   Acute on chronic respiratory failure with hypoxia and hypercapnia (HCC) Active Problems:   Acute on chronic diastolic CHF (congestive heart failure) (HCC)   COPD exacerbation (HCC)   Hypokalemia   Lobar pneumonia   HTN (hypertension)   Acute kidney injury superimposed on CKD   Obesity (BMI 30-39.9)   Erythrocytosis   Thrombocytopenia   Acute urinary retention   Hypoxia   Thrush  Acute hypoxic & hypercapnic respiratory failure: continue on supplemental oxygen and wean as tolerated. BiPAP prn. Does not use oxygen at home. Will d/c w/ supplemental oxygen    Acute on chronic diastolic CHF: with anasarca.  EF 50 to 55%.  Continue on lasix , metolazone, metoprolol, losartan, aldactone, jardiance. Monitor I/Os    COPD  exacerbation: continue on steroid taper, bronchodilators & encourage incentive spirometry    Lobar pneumonia: completed abx course. Continue on steroid taper and continue on bronchodilators. Encourage incentive spirometry    Hypokalemia: WNL today    HTN: continue on metoprolol, aldactone, losartan    AKI on CKDIIIa: Cr is better than baseline    Obesity: BMI 33.6. Would benefit from weight loss   Thrush: resolved. Completed fluconazole course    Acute urinary retention: continue on flomax    Thrombocytopenia: labile. Etiology unclear. Will continue to monitor    Erythrocytosis: likely secondary to chronic hypoxia.JAK2 is still pending. Erythropoietin level actually low.  Discharge Instructions  Discharge Instructions     AMB referral to CHF clinic   Complete by: As directed    Reason for referral: Diastolic HF   Diet - low sodium heart healthy   Complete by: As directed    Discharge instructions   Complete by: As directed    F/u w/ PCP in 1-2 weeks. F/u w/ cardio in 1-2 weeks.   Increase activity slowly   Complete by: As directed       Allergies as of 04/08/2024   No Known Allergies      Medication List     STOP taking these medications    amoxicillin -clavulanate 875-125 MG tablet Commonly known as: AUGMENTIN    carvedilol  6.25 MG tablet Commonly known as: COREG    nabumetone  500 MG tablet Commonly known as: RELAFEN        TAKE these medications    acetaminophen  500 MG  tablet Commonly known as: TYLENOL  Take 500 mg by mouth every 6 (six) hours as needed for fever, headache, moderate pain or mild pain.   albuterol  108 (90 Base) MCG/ACT inhaler Commonly known as: VENTOLIN  HFA Inhale 2 puffs into the lungs every 4 (four) hours as needed for wheezing or shortness of breath.   amitriptyline 25 MG tablet Commonly known as: ELAVIL Take 25 mg by mouth at bedtime.   ARGININE PO Take 1 capsule by mouth 2 (two) times daily. With citrulline   aspirin  EC 81  MG tablet Take 81 mg by mouth daily.   calcium  carbonate 500 MG chewable tablet Commonly known as: TUMS - dosed in mg elemental calcium  Chew 1 tablet by mouth daily as needed for indigestion or heartburn.   dapagliflozin propanediol 10 MG Tabs tablet Commonly known as: FARXIGA Take 1 tablet (10 mg total) by mouth daily. Start taking on: April 09, 2024   desvenlafaxine 50 MG 24 hr tablet Commonly known as: PRISTIQ Take 50 mg by mouth daily.   diazepam  10 MG tablet Commonly known as: VALIUM  Take 1 tablet (10 mg total) by mouth at bedtime as needed for up to 2 days for anxiety or sleep.   docusate sodium  100 MG capsule Commonly known as: COLACE Take 1 capsule (100 mg total) by mouth 2 (two) times daily.   furosemide  40 MG tablet Commonly known as: LASIX  Take 1 tablet (40 mg total) by mouth daily.   losartan 25 MG tablet Commonly known as: COZAAR Take 1 tablet (25 mg total) by mouth daily. Start taking on: April 09, 2024   metolazone 2.5 MG tablet Commonly known as: ZAROXOLYN Take 2.5 mg by mouth daily.   metoprolol succinate 25 MG 24 hr tablet Commonly known as: TOPROL-XL Take 1 tablet (25 mg total) by mouth daily. Start taking on: April 09, 2024   montelukast  10 MG tablet Commonly known as: SINGULAIR  Take 10 mg by mouth daily.   potassium chloride  10 MEQ tablet Commonly known as: KLOR-CON  M Take 1 tablet (10 mEq total) by mouth 2 (two) times daily.   predniSONE  10 MG tablet Commonly known as: DELTASONE  40mg  daily x 2 days, 30mg  daily x 2 days, 20mg  daily x 2 days, 10mg  daily x 2 days, then stop What changed:  how to take this additional instructions   PSYLLIUM HUSK PO Take 2 capsules by mouth 2 (two) times daily.   Spiriva  Respimat 2.5 MCG/ACT Aers Generic drug: Tiotropium Bromide  Inhale 1 puff into the lungs 2 (two) times daily.   spironolactone 25 MG tablet Commonly known as: ALDACTONE Take 1 tablet (25 mg total) by mouth daily. Start taking  on: April 09, 2024   tamsulosin 0.4 MG Caps capsule Commonly known as: FLOMAX Take 1 capsule (0.4 mg total) by mouth daily. Start taking on: April 09, 2024   Wixela Inhub 500-50 MCG/ACT Aepb Generic drug: fluticasone -salmeterol Inhale 1 puff into the lungs in the morning and at bedtime. What changed: Another medication with the same name was removed. Continue taking this medication, and follow the directions you see here.               Durable Medical Equipment  (From admission, onward)           Start     Ordered   03/27/24 1149  For home use only DME oxygen  Once       Question Answer Comment  Length of Need Lifetime   Mode or (Route) Nasal cannula  Liters per Minute 3   Frequency Continuous (stationary and portable oxygen unit needed)   Oxygen conserving device Yes   Oxygen delivery system: Gas      03/27/24 1148            Follow-up Information     [facility]. Go on 04/24/2024.   Specialty: Cardiology Why: Hospital Follow-Up 04/24/24 @ 12:00 Please bring all medications to follow-up appointment [facility], [address] Floor Free Valet Parking at the door Contact information: [address] [place]  [address] [phone]               No Known Allergies  Consultations: Pulmon Palliative care   Procedures/Studies: [facility] Chest Port 1 View Result Date: 03/28/2024 CLINICAL DATA:  [id number] Emphysema lung (HCC) 896111 EXAM: PORTABLE CHEST - 1 VIEW COMPARISON:  March 25, 2024 FINDINGS: Patchy airspace opacities in the right mid lung. No pleural effusion or pneumothorax. Mild cardiomegaly.No acute fracture or destructive lesion. Bilateral AC joint osteoarthritis. IMPRESSION: Patchy airspace opacities in the right mid lung, which may represent atelectasis or a developing bronchopneumonia, in the correct clinical context. Electronically Signed   By: Rogelia Myers M.D.   On: 03/28/2024 19:41   ECHOCARDIOGRAM COMPLETE Result Date: 03/26/2024    ECHOCARDIOGRAM REPORT   Patient Name:   Miguel Howell Date of Exam: 03/26/2024 Medical Rec #:  [id number]      Height:       69.0 in Accession #:    [id number]     Weight:       250.0 lb Date of Birth:  02-Oct-1955      BSA:          2.272 m Patient Age:    68 years       BP:           157/100 mmHg Patient Gender: M              HR:           80 bpm. Exam Location:  [facility] Procedure: 2D Echo, Cardiac Doppler and Color Doppler (Both Spectral and Color            Flow Doppler were utilized during procedure). Indications:     CHF-acute diastolic I50.31  History:         Patient has prior history of Echocardiogram examinations, most                  recent 06/10/2016. CHF, COPD; Risk Factors:Hypertension.  Sonographer:     Christopher Furnace Referring Phys:  [id number] [facility] Diagnosing Phys: Keller Paterson  Sonographer Comments: No apical window. IMPRESSIONS  1. Technically very difficult study, no apical views available.  2. Left ventricular ejection fraction, by estimation, is 50 to 55% based on limited views. The left ventricle has low normal function. Left ventricular endocardial border not optimally defined to evaluate regional wall motion. There is mild left ventricular hypertrophy. Left ventricular diastolic parameters are indeterminate.  3. Right ventricular systolic function was not well visualized. The right ventricular size is not well visualized.  4. The mitral valve is normal in structure. Trivial mitral valve regurgitation.  5. The aortic valve is tricuspid. Aortic valve regurgitation is not visualized.  6. The inferior vena cava is normal in size with greater than 50% respiratory variability, suggesting right atrial pressure of 3 mmHg. FINDINGS  Left Ventricle: Left ventricular ejection fraction, by estimation, is  50 to 55%. The left ventricle has low normal function. Left ventricular endocardial border not optimally  defined to evaluate regional wall motion. The left ventricular internal cavity  size was normal in size. There is mild left ventricular hypertrophy. Left ventricular diastolic parameters are indeterminate. Right Ventricle: The right ventricular size is not well visualized. Right vetricular wall thickness was not well visualized. Right ventricular systolic function was not well visualized. Left Atrium: Left atrial size was not well visualized. Right Atrium: Right atrial size was not well visualized. Pericardium: There is no evidence of pericardial effusion. Mitral Valve: The mitral valve is normal in structure. Trivial mitral valve regurgitation. Tricuspid Valve: The tricuspid valve is normal in structure. Tricuspid valve regurgitation is trivial. Aortic Valve: The aortic valve is tricuspid. Aortic valve regurgitation is not visualized. Pulmonic Valve: The pulmonic valve was not well visualized. Pulmonic valve regurgitation is not visualized. Aorta: The aortic root is normal in size and structure. Venous: The inferior vena cava is normal in size with greater than 50% respiratory variability, suggesting right atrial pressure of 3 mmHg. IAS/Shunts: The interatrial septum was not well visualized.  LEFT VENTRICLE PLAX 2D LVIDd:         4.50 cm LVIDs:         2.90 cm LV PW:         1.20 cm LV IVS:        1.20 cm LVOT diam:     2.10 cm LVOT Area:     3.46 cm  LEFT ATRIUM         Index LA diam:    3.50 cm 1.54 cm/m   AORTA Ao Root diam: 3.10 cm  SHUNTS Systemic Diam: 2.10 cm Keller Paterson Electronically signed by Keller Paterson Signature Date/Time: 03/26/2024/4:53:04 PM    Final    US  Venous Img Lower Bilateral (DVT) Result Date: 03/25/2024 CLINICAL DATA:  Bilateral leg swelling EXAM: Bilateral LOWER EXTREMITY VENOUS DOPPLER ULTRASOUND TECHNIQUE: Gray-scale sonography with compression, as well as color and duplex ultrasound, were performed to evaluate the deep venous system(s) from the level of the common femoral  vein through the popliteal and proximal calf veins. COMPARISON:  None Available. FINDINGS: VENOUS Normal compressibility of the common femoral, superficial femoral, and popliteal veins, as well as the visualized calf veins. Visualized portions of profunda femoral vein and great saphenous vein unremarkable. No filling defects to suggest DVT on grayscale or color Doppler imaging. Doppler waveforms show normal direction of venous flow, normal respiratory plasticity and response to augmentation. OTHER None. Limitations: none IMPRESSION: Negative. Electronically Signed   By: Luke Bun M.D.   On: 03/25/2024 23:54   DG Chest 2 View Result Date: 03/25/2024 CLINICAL DATA:  Shortness of breath and cough 2-3 days. EXAM: CHEST - 2 VIEW COMPARISON:  10/16/2021 FINDINGS: Lungs are adequately inflated without focal airspace consolidation or effusion. Cardiomediastinal silhouette and remainder of the exam is unchanged. IMPRESSION: No active cardiopulmonary disease. Electronically Signed   By: Toribio Agreste M.D.   On: 03/25/2024 14:41   (Echo, Carotid, EGD, Colonoscopy, ERCP)    Subjective: Pt c/o malaise   Discharge Exam: Vitals:   04/08/24 0807 04/08/24 1159  BP: 126/65 121/68  Pulse: 86 86  Resp: 18 16  Temp: 97.7 F (36.5 C) 98 F (36.7 C)  SpO2: 97% 99%   Vitals:   04/07/24 2345 04/08/24 0455 04/08/24 0807 04/08/24 1159  BP: 122/74 127/74 126/65 121/68  Pulse: 88 73 86 86  Resp:  18 18 16  Temp: 98.8 F (37.1 C) 98.5 F (36.9 C) 97.7 F (36.5 C) 98 F (36.7 C)  TempSrc: Oral     SpO2: 98% 100% 97% 99%  Weight:  101.8 kg    Height:        General: Pt is alert, awake, not in acute distress Cardiovascular:  S1/S2 +, no rubs, no gallops Respiratory: decreased breath sounds b/l Abdominal: Soft, NT, obese, bowel sounds + Extremities:  no cyanosis    The results of significant diagnostics from this hospitalization (including imaging, microbiology, ancillary and laboratory) are listed  below for reference.     Microbiology: No results found for this or any previous visit (from the past 240 hours).   Labs: BNP (last 3 results) Recent Labs    03/25/24 1149  BNP 1,280.7*   Basic Metabolic Panel: Recent Labs  Lab 04/04/24 0419 04/05/24 0545 04/05/24 0925 04/06/24 0517 04/07/24 0337 04/08/24 0405  NA 141 143  --  138 137 139  K 4.2 6.7* 4.2 4.2 4.2 5.0  CL 91* 90*  --  92* 91* 92*  CO2 38* 44*  --  37* 36* 38*  GLUCOSE 144* 125*  --  146* 124* 124*  BUN 25* 23  --  22 25* 27*  CREATININE 0.79 1.07  --  0.66 0.90 1.03  CALCIUM  9.3 9.8  --  9.2 8.9 9.4   Liver Function Tests: No results for input(s): AST, ALT, ALKPHOS, BILITOT, PROT, ALBUMIN in the last 168 hours. No results for input(s): LIPASE, AMYLASE in the last 168 hours. No results for input(s): AMMONIA in the last 168 hours. CBC: Recent Labs  Lab 04/04/24 0419 04/05/24 0545 04/06/24 0517 04/07/24 0337 04/08/24 0405  WBC 5.9 6.1 7.7 6.7 7.5  HGB 17.8* 18.2* 17.8* 17.1* 17.2*  HCT 56.5* 59.1* 55.0* 53.3* 54.0*  MCV 111.2* 113.9* 110.0* 108.1* 110.2*  PLT 68* 67* 59* 61* 62*   Cardiac Enzymes: No results for input(s): CKTOTAL, CKMB, CKMBINDEX, TROPONINI in the last 168 hours. BNP: Invalid input(s): POCBNP CBG: No results for input(s): GLUCAP in the last 168 hours. D-Dimer No results for input(s): DDIMER in the last 72 hours. Hgb A1c No results for input(s): HGBA1C in the last 72 hours. Lipid Profile No results for input(s): CHOL, HDL, LDLCALC, TRIG, CHOLHDL, LDLDIRECT in the last 72 hours. Thyroid  function studies No results for input(s): TSH, T4TOTAL, T3FREE, THYROIDAB in the last 72 hours.  Invalid input(s): FREET3 Anemia work up No results for input(s): VITAMINB12, FOLATE, FERRITIN, TIBC, IRON, RETICCTPCT in the last 72 hours. Urinalysis    Component Value Date/Time   COLORURINE STRAW (A) 03/25/2024 1940    APPEARANCEUR CLEAR (A) 03/25/2024 1940   LABSPEC 1.005 03/25/2024 1940   PHURINE 7.0 03/25/2024 1940   GLUCOSEU NEGATIVE 03/25/2024 1940   HGBUR SMALL (A) 03/25/2024 1940   BILIRUBINUR NEGATIVE 03/25/2024 1940   KETONESUR NEGATIVE 03/25/2024 1940   PROTEINUR 30 (A) 03/25/2024 1940   NITRITE NEGATIVE 03/25/2024 1940   LEUKOCYTESUR NEGATIVE 03/25/2024 1940   Sepsis Labs Recent Labs  Lab 04/05/24 0545 04/06/24 0517 04/07/24 0337 04/08/24 0405  WBC 6.1 7.7 6.7 7.5   Microbiology No results found for this or any previous visit (from the past 240 hours).   Time coordinating discharge: 37 minutes  SIGNED:   Anthony CHRISTELLA Pouch, MD  [facility] 04/08/2024, 2:51 PM Pager   If 7PM-7AM, please contact night-coverage [email or web address]

## 2024-04-08 NOTE — TOC Transition Note (Signed)
 Transition of Care [facility]) - Discharge Note   Patient Details  Name: Miguel Howell MRN: [id number] Date of Birth: 24-Feb-1956  [facility] CM/SW Contact:  Lauraine [name], LCSW Phone Number: 04/08/2024, 2:23 PM   Clinical Narrative:  Patient has orders to discharge to [facility] today. RN will call report to [phone]. Sister will transport. New NIV order form signed by MD and emailed to [employer] liaison. Order will be valid for 30 days. No further concerns. CSW signing off.   Final next level of care: Skilled Nursing Facility Barriers to Discharge: Barriers Resolved   Patient Goals and CMS Choice            Discharge Placement PASRR number recieved: 03/31/24            Patient chooses bed at: Other - please specify in the comment section below: ([facility]) Patient to be transferred to facility by: Sister Name of family member notified: Loa Blackwood Patient and family notified of of transfer: 04/08/24  Discharge Plan and Services Additional resources added to the After Visit Summary for                                       Social Drivers of Health (SDOH) Interventions SDOH Screenings   Food Insecurity: No Food Insecurity (03/27/2024)  Housing: Low Risk  (03/27/2024)  Transportation Needs: No Transportation Needs (03/27/2024)  Utilities: Not At Risk (03/27/2024)  Financial Resource Strain: Low Risk  (05/07/2023)   Received from [facility]  Social Connections: Unknown (03/27/2024)  Tobacco Use: High Risk (03/25/2024)     Readmission Risk Interventions     No data to display

## 2024-04-09 DIAGNOSIS — I10 Essential (primary) hypertension: Secondary | ICD-10-CM | POA: Diagnosis not present

## 2024-04-09 DIAGNOSIS — G4730 Sleep apnea, unspecified: Secondary | ICD-10-CM | POA: Diagnosis not present

## 2024-04-09 DIAGNOSIS — R260 Ataxic gait: Secondary | ICD-10-CM | POA: Diagnosis not present

## 2024-04-09 DIAGNOSIS — I509 Heart failure, unspecified: Secondary | ICD-10-CM | POA: Diagnosis not present

## 2024-04-09 DIAGNOSIS — J449 Chronic obstructive pulmonary disease, unspecified: Secondary | ICD-10-CM | POA: Diagnosis not present

## 2024-04-10 DIAGNOSIS — I509 Heart failure, unspecified: Secondary | ICD-10-CM | POA: Diagnosis not present

## 2024-04-10 DIAGNOSIS — J449 Chronic obstructive pulmonary disease, unspecified: Secondary | ICD-10-CM | POA: Diagnosis not present

## 2024-04-10 DIAGNOSIS — N189 Chronic kidney disease, unspecified: Secondary | ICD-10-CM | POA: Diagnosis not present

## 2024-04-10 DIAGNOSIS — R260 Ataxic gait: Secondary | ICD-10-CM | POA: Diagnosis not present

## 2024-04-10 LAB — CALR +MPL + E12-E15  (REFLEX)

## 2024-04-10 LAB — JAK2 V617F RFX CALR/MPL/E12-15

## 2024-04-11 DIAGNOSIS — N189 Chronic kidney disease, unspecified: Secondary | ICD-10-CM | POA: Diagnosis not present

## 2024-04-11 DIAGNOSIS — J449 Chronic obstructive pulmonary disease, unspecified: Secondary | ICD-10-CM | POA: Diagnosis not present

## 2024-04-11 DIAGNOSIS — I509 Heart failure, unspecified: Secondary | ICD-10-CM | POA: Diagnosis not present

## 2024-04-11 DIAGNOSIS — R260 Ataxic gait: Secondary | ICD-10-CM | POA: Diagnosis not present

## 2024-04-12 DIAGNOSIS — J449 Chronic obstructive pulmonary disease, unspecified: Secondary | ICD-10-CM | POA: Diagnosis not present

## 2024-04-12 DIAGNOSIS — N189 Chronic kidney disease, unspecified: Secondary | ICD-10-CM | POA: Diagnosis not present

## 2024-04-12 DIAGNOSIS — I509 Heart failure, unspecified: Secondary | ICD-10-CM | POA: Diagnosis not present

## 2024-04-12 DIAGNOSIS — R260 Ataxic gait: Secondary | ICD-10-CM | POA: Diagnosis not present

## 2024-04-14 DIAGNOSIS — R260 Ataxic gait: Secondary | ICD-10-CM | POA: Diagnosis not present

## 2024-04-14 DIAGNOSIS — N189 Chronic kidney disease, unspecified: Secondary | ICD-10-CM | POA: Diagnosis not present

## 2024-04-14 DIAGNOSIS — J449 Chronic obstructive pulmonary disease, unspecified: Secondary | ICD-10-CM | POA: Diagnosis not present

## 2024-04-14 DIAGNOSIS — I509 Heart failure, unspecified: Secondary | ICD-10-CM | POA: Diagnosis not present

## 2024-04-15 DIAGNOSIS — R260 Ataxic gait: Secondary | ICD-10-CM | POA: Diagnosis not present

## 2024-04-15 DIAGNOSIS — I509 Heart failure, unspecified: Secondary | ICD-10-CM | POA: Diagnosis not present

## 2024-04-15 DIAGNOSIS — N189 Chronic kidney disease, unspecified: Secondary | ICD-10-CM | POA: Diagnosis not present

## 2024-04-15 DIAGNOSIS — J449 Chronic obstructive pulmonary disease, unspecified: Secondary | ICD-10-CM | POA: Diagnosis not present

## 2024-04-16 DIAGNOSIS — I10 Essential (primary) hypertension: Secondary | ICD-10-CM | POA: Diagnosis not present

## 2024-04-16 DIAGNOSIS — I509 Heart failure, unspecified: Secondary | ICD-10-CM | POA: Diagnosis not present

## 2024-04-16 DIAGNOSIS — N179 Acute kidney failure, unspecified: Secondary | ICD-10-CM | POA: Diagnosis not present

## 2024-04-16 DIAGNOSIS — R260 Ataxic gait: Secondary | ICD-10-CM | POA: Diagnosis not present

## 2024-04-16 DIAGNOSIS — J449 Chronic obstructive pulmonary disease, unspecified: Secondary | ICD-10-CM | POA: Diagnosis not present

## 2024-04-17 DIAGNOSIS — R260 Ataxic gait: Secondary | ICD-10-CM | POA: Diagnosis not present

## 2024-04-17 DIAGNOSIS — N179 Acute kidney failure, unspecified: Secondary | ICD-10-CM | POA: Diagnosis not present

## 2024-04-17 DIAGNOSIS — I509 Heart failure, unspecified: Secondary | ICD-10-CM | POA: Diagnosis not present

## 2024-04-17 DIAGNOSIS — J449 Chronic obstructive pulmonary disease, unspecified: Secondary | ICD-10-CM | POA: Diagnosis not present

## 2024-04-18 DIAGNOSIS — E860 Dehydration: Secondary | ICD-10-CM | POA: Diagnosis not present

## 2024-04-18 DIAGNOSIS — J449 Chronic obstructive pulmonary disease, unspecified: Secondary | ICD-10-CM | POA: Diagnosis not present

## 2024-04-18 DIAGNOSIS — I509 Heart failure, unspecified: Secondary | ICD-10-CM | POA: Diagnosis not present

## 2024-04-18 DIAGNOSIS — N179 Acute kidney failure, unspecified: Secondary | ICD-10-CM | POA: Diagnosis not present

## 2024-04-18 DIAGNOSIS — R260 Ataxic gait: Secondary | ICD-10-CM | POA: Diagnosis not present

## 2024-04-19 DIAGNOSIS — J449 Chronic obstructive pulmonary disease, unspecified: Secondary | ICD-10-CM | POA: Diagnosis not present

## 2024-04-19 DIAGNOSIS — I509 Heart failure, unspecified: Secondary | ICD-10-CM | POA: Diagnosis not present

## 2024-04-19 DIAGNOSIS — N179 Acute kidney failure, unspecified: Secondary | ICD-10-CM | POA: Diagnosis not present

## 2024-04-20 DIAGNOSIS — J449 Chronic obstructive pulmonary disease, unspecified: Secondary | ICD-10-CM | POA: Diagnosis not present

## 2024-04-20 DIAGNOSIS — N179 Acute kidney failure, unspecified: Secondary | ICD-10-CM | POA: Diagnosis not present

## 2024-04-20 DIAGNOSIS — I509 Heart failure, unspecified: Secondary | ICD-10-CM | POA: Diagnosis not present

## 2024-04-20 DIAGNOSIS — R260 Ataxic gait: Secondary | ICD-10-CM | POA: Diagnosis not present

## 2024-04-21 DIAGNOSIS — J069 Acute upper respiratory infection, unspecified: Secondary | ICD-10-CM | POA: Diagnosis not present

## 2024-04-21 DIAGNOSIS — J449 Chronic obstructive pulmonary disease, unspecified: Secondary | ICD-10-CM | POA: Diagnosis not present

## 2024-04-21 DIAGNOSIS — I509 Heart failure, unspecified: Secondary | ICD-10-CM | POA: Diagnosis not present

## 2024-04-21 DIAGNOSIS — N179 Acute kidney failure, unspecified: Secondary | ICD-10-CM | POA: Diagnosis not present

## 2024-04-22 DIAGNOSIS — N179 Acute kidney failure, unspecified: Secondary | ICD-10-CM | POA: Diagnosis not present

## 2024-04-22 DIAGNOSIS — J449 Chronic obstructive pulmonary disease, unspecified: Secondary | ICD-10-CM | POA: Diagnosis not present

## 2024-04-22 DIAGNOSIS — I509 Heart failure, unspecified: Secondary | ICD-10-CM | POA: Diagnosis not present

## 2024-04-22 DIAGNOSIS — R260 Ataxic gait: Secondary | ICD-10-CM | POA: Diagnosis not present

## 2024-04-23 ENCOUNTER — Telehealth: Payer: Self-pay | Admitting: Family

## 2024-04-23 DIAGNOSIS — I509 Heart failure, unspecified: Secondary | ICD-10-CM | POA: Diagnosis not present

## 2024-04-23 DIAGNOSIS — N179 Acute kidney failure, unspecified: Secondary | ICD-10-CM | POA: Diagnosis not present

## 2024-04-23 DIAGNOSIS — R260 Ataxic gait: Secondary | ICD-10-CM | POA: Diagnosis not present

## 2024-04-23 DIAGNOSIS — J449 Chronic obstructive pulmonary disease, unspecified: Secondary | ICD-10-CM | POA: Diagnosis not present

## 2024-04-23 NOTE — Progress Notes (Deleted)
 [facility] Note   Referring Physician: 10/25 admission PCP: Auston Reyes BIRCH, MD Cardiologist: None   Chief Complaint:   HPI:  Miguel Howell is a 68 y/o male with a history of HTN, HLD, HFpEF, COPD.  TTE 06/2016 showed LVEF of 65-70% with G2DD   Admitted 03/25/24 with progressive shortness of breath over 3-4 weeks with sputum production. On admission, BNP was 1280.7, HS-troponin was 79, and A1c 6.4. Chest x-ray noted no active cardiopulmonary disease. TTE 03/26/24 noted LVEF 50-55%, mild LVH, and indeterminate diastolic dysfunction. Placed on bipap and then transitioned to oxygen. Steroid taper for COPD exacerbation. Diuresed. Antibiotics for pneumonia.      Review of Systems: [y] = yes, [ ]  = no   General: Weight gain [ ] ; Weight loss [ ] ; Anorexia [ ] ; Fatigue [ ] ; Fever [ ] ; Chills [ ] ; Weakness [ ]   Cardiac: Chest pain/pressure [ ] ; Resting SOB [ ] ; Exertional SOB [ ] ; Orthopnea [ ] ; Pedal Edema [ ] ; Palpitations [ ] ; Syncope [ ] ; Presyncope [ ] ; Paroxysmal nocturnal dyspnea[ ]   Pulmonary: Cough [ ] ; Wheezing[ ] ; Hemoptysis[ ] ; Sputum [ ] ; Snoring [ ]   GI: Vomiting[ ] ; Dysphagia[ ] ; Melena[ ] ; Hematochezia [ ] ; Heartburn[ ] ; Abdominal pain [ ] ; Constipation [ ] ; Diarrhea [ ] ; BRBPR [ ]   GU: Hematuria[ ] ; Dysuria [ ] ; Nocturia[ ]   Vascular: Pain in legs with walking [ ] ; Pain in feet with lying flat [ ] ; Non-healing sores [ ] ; Stroke [ ] ; TIA [ ] ; Slurred speech [ ] ;  Neuro: Headaches[ ] ; Vertigo[ ] ; Seizures[ ] ; Paresthesias[ ] ;Blurred vision [ ] ; Diplopia [ ] ; Vision changes [ ]   Ortho/Skin: Arthritis [ ] ; Joint pain [ ] ; Muscle pain [ ] ; Joint swelling [ ] ; Back Pain [ ] ; Rash [ ]   Psych: Depression[ ] ; Anxiety[ ]   Heme: Bleeding problems [ ] ; Clotting disorders [ ] ; Anemia [ ]   Endocrine: Diabetes [ ] ; Thyroid  dysfunction[ ]    Past Medical History:  Diagnosis Date   Arthritis    bilateral hips and back   Asthma    CHF (congestive heart failure) (HCC)     COPD (chronic obstructive pulmonary disease) (HCC)    Dyspnea    Hypertension     Current Outpatient Medications  Medication Sig Dispense Refill   acetaminophen  (TYLENOL ) 500 MG tablet Take 500 mg by mouth every 6 (six) hours as needed for fever, headache, moderate pain or mild pain.     albuterol  (PROVENTIL  HFA;VENTOLIN  HFA) 108 (90 Base) MCG/ACT inhaler Inhale 2 puffs into the lungs every 4 (four) hours as needed for wheezing or shortness of breath.     amitriptyline (ELAVIL) 25 MG tablet Take 25 mg by mouth at bedtime.     ARGININE PO Take 1 capsule by mouth 2 (two) times daily. With citrulline     aspirin  EC 81 MG tablet Take 81 mg by mouth daily.     calcium  carbonate (TUMS - DOSED IN MG ELEMENTAL CALCIUM ) 500 MG chewable tablet Chew 1 tablet by mouth daily as needed for indigestion or heartburn.     dapagliflozin propanediol (FARXIGA) 10 MG TABS tablet Take 1 tablet (10 mg total) by mouth daily. 30 tablet 0   desvenlafaxine (PRISTIQ) 50 MG 24 hr tablet Take 50 mg by mouth daily.     docusate sodium  (COLACE) 100 MG capsule Take 1 capsule (100 mg total) by mouth 2 (two) times daily. 10 capsule 0   furosemide  (LASIX ) 40 MG  tablet Take 1 tablet (40 mg total) by mouth daily. 30 tablet 5   losartan (COZAAR) 25 MG tablet Take 1 tablet (25 mg total) by mouth daily. 30 tablet 0   metolazone (ZAROXOLYN) 2.5 MG tablet Take 2.5 mg by mouth daily.     metoprolol succinate (TOPROL-XL) 25 MG 24 hr tablet Take 1 tablet (25 mg total) by mouth daily. 30 tablet 0   montelukast  (SINGULAIR ) 10 MG tablet Take 10 mg by mouth daily.     potassium chloride  (K-DUR,KLOR-CON ) 10 MEQ tablet Take 1 tablet (10 mEq total) by mouth 2 (two) times daily. 30 tablet 6   predniSONE  (DELTASONE ) 10 MG tablet 40mg  daily x 2 days, 30mg  daily x 2 days, 20mg  daily x 2 days, 10mg  daily x 2 days, then stop 20 tablet 0   PSYLLIUM HUSK PO Take 2 capsules by mouth 2 (two) times daily.     spironolactone (ALDACTONE) 25 MG tablet Take 1  tablet (25 mg total) by mouth daily. 30 tablet 0   tamsulosin (FLOMAX) 0.4 MG CAPS capsule Take 1 capsule (0.4 mg total) by mouth daily. 30 capsule 0   Tiotropium Bromide  Monohydrate (SPIRIVA  RESPIMAT) 2.5 MCG/ACT AERS Inhale 1 puff into the lungs 2 (two) times daily.     WIXELA INHUB 500-50 MCG/ACT AEPB Inhale 1 puff into the lungs in the morning and at bedtime.     No current facility-administered medications for this visit.    No Known Allergies    Social History   Socioeconomic History   Marital status: Single    Spouse name: Not on file   Number of children: Not on file   Years of education: Not on file   Highest education level: Not on file  Occupational History   Not on file  Tobacco Use   Smoking status: Every Day    Current packs/day: 0.00    Average packs/day: 1.5 packs/day for 40.0 years (60.0 ttl pk-yrs)    Types: Cigarettes    Start date: 07/27/1975    Last attempt to quit: 07/27/2015    Years since quitting: 8.7   Smokeless tobacco: Former  Building Services Engineer status: Never Used  Substance and Sexual Activity   Alcohol use: Not Currently   Drug use: No   Sexual activity: Not on file  Other Topics Concern   Not on file  Social History Narrative   Not on file   Social Drivers of Health   Financial Resource Strain: Low Risk  (05/07/2023)   Received from [facility]   Overall Financial Resource Strain (CARDIA)    Difficulty of Paying Living Expenses: Not hard at all  Food Insecurity: No Food Insecurity (03/27/2024)   Hunger Vital Sign    Worried About Running Out of Food in the Last Year: Never true    Ran Out of Food in the Last Year: Never true  Transportation Needs: No Transportation Needs (03/27/2024)   PRAPARE - Administrator, Civil Service (Medical): No    Lack of Transportation (Non-Medical): No  Physical Activity: Not on file  Stress: Not on file  Social Connections: Unknown (03/27/2024)   Social Connection and  Isolation Panel    Frequency of Communication with Friends and Family: More than three times a week    Frequency of Social Gatherings with Friends and Family: Patient declined    Attends Religious Services: Patient declined    Database Administrator or Organizations: Patient declined  Attends Banker Meetings: Patient declined    Marital Status: Patient declined  Intimate Partner Violence: Not At Risk (03/27/2024)   Humiliation, Afraid, Rape, and Kick questionnaire    Fear of Current or Ex-Partner: No    Emotionally Abused: No    Physically Abused: No    Sexually Abused: No      Family History  Problem Relation Age of Onset   Diabetes Mother    Diabetes Father    COPD Father    Heart attack Father        PHYSICAL EXAM: General:  Well appearing. No respiratory difficulty HEENT: normal Neck: supple. no JVD. Carotids 2+ bilat; no bruits. No lymphadenopathy or thyromegaly appreciated. Cor: PMI nondisplaced. Regular rate & rhythm. No rubs, gallops or murmurs. Lungs: clear Abdomen: soft, nontender, nondistended. No hepatosplenomegaly. No bruits or masses. Good bowel sounds. Extremities: no cyanosis, clubbing, rash, edema Neuro: alert & oriented x 3, cranial nerves grossly intact. moves all 4 extremities w/o difficulty. Affect pleasant.  ECG:   ASSESSMENT & PLAN:     Ellouise DELENA Class, FNP 04/23/24

## 2024-04-23 NOTE — Telephone Encounter (Signed)
 Called to confirm/remind patient of their appointment at the [facility] on 04/23/24.   Appointment:   [] Confirmed  [x] Left mess   [] No answer/No voice mail  [] VM Full/unable to leave message  [] Phone not in service  Patient reminded to bring all medications and/or complete list.  Confirmed patient has transportation. Gave directions, instructed to utilize valet parking.

## 2024-04-24 ENCOUNTER — Inpatient Hospital Stay: Admitting: Family

## 2024-04-24 DIAGNOSIS — I509 Heart failure, unspecified: Secondary | ICD-10-CM | POA: Diagnosis not present

## 2024-04-24 DIAGNOSIS — E876 Hypokalemia: Secondary | ICD-10-CM | POA: Diagnosis not present

## 2024-04-24 DIAGNOSIS — N179 Acute kidney failure, unspecified: Secondary | ICD-10-CM | POA: Diagnosis not present

## 2024-04-24 DIAGNOSIS — K219 Gastro-esophageal reflux disease without esophagitis: Secondary | ICD-10-CM | POA: Diagnosis not present

## 2024-04-24 DIAGNOSIS — J449 Chronic obstructive pulmonary disease, unspecified: Secondary | ICD-10-CM | POA: Diagnosis not present

## 2024-04-24 DIAGNOSIS — J3489 Other specified disorders of nose and nasal sinuses: Secondary | ICD-10-CM | POA: Diagnosis not present

## 2024-04-25 DIAGNOSIS — J449 Chronic obstructive pulmonary disease, unspecified: Secondary | ICD-10-CM | POA: Diagnosis not present

## 2024-04-25 DIAGNOSIS — R260 Ataxic gait: Secondary | ICD-10-CM | POA: Diagnosis not present

## 2024-04-25 DIAGNOSIS — I509 Heart failure, unspecified: Secondary | ICD-10-CM | POA: Diagnosis not present

## 2024-04-26 DIAGNOSIS — N189 Chronic kidney disease, unspecified: Secondary | ICD-10-CM | POA: Diagnosis not present

## 2024-04-26 DIAGNOSIS — R260 Ataxic gait: Secondary | ICD-10-CM | POA: Diagnosis not present

## 2024-04-26 DIAGNOSIS — I509 Heart failure, unspecified: Secondary | ICD-10-CM | POA: Diagnosis not present

## 2024-04-26 DIAGNOSIS — J449 Chronic obstructive pulmonary disease, unspecified: Secondary | ICD-10-CM | POA: Diagnosis not present

## 2024-04-27 DIAGNOSIS — R260 Ataxic gait: Secondary | ICD-10-CM | POA: Diagnosis not present

## 2024-04-27 DIAGNOSIS — N189 Chronic kidney disease, unspecified: Secondary | ICD-10-CM | POA: Diagnosis not present

## 2024-04-27 DIAGNOSIS — I509 Heart failure, unspecified: Secondary | ICD-10-CM | POA: Diagnosis not present

## 2024-04-27 DIAGNOSIS — J449 Chronic obstructive pulmonary disease, unspecified: Secondary | ICD-10-CM | POA: Diagnosis not present

## 2024-04-28 DIAGNOSIS — I509 Heart failure, unspecified: Secondary | ICD-10-CM | POA: Diagnosis not present

## 2024-04-28 DIAGNOSIS — N189 Chronic kidney disease, unspecified: Secondary | ICD-10-CM | POA: Diagnosis not present

## 2024-04-28 DIAGNOSIS — R260 Ataxic gait: Secondary | ICD-10-CM | POA: Diagnosis not present

## 2024-04-28 DIAGNOSIS — J449 Chronic obstructive pulmonary disease, unspecified: Secondary | ICD-10-CM | POA: Diagnosis not present

## 2024-04-29 DIAGNOSIS — J449 Chronic obstructive pulmonary disease, unspecified: Secondary | ICD-10-CM | POA: Diagnosis not present

## 2024-04-29 DIAGNOSIS — M1990 Unspecified osteoarthritis, unspecified site: Secondary | ICD-10-CM | POA: Diagnosis not present

## 2024-04-29 DIAGNOSIS — N189 Chronic kidney disease, unspecified: Secondary | ICD-10-CM | POA: Diagnosis not present

## 2024-04-29 DIAGNOSIS — R260 Ataxic gait: Secondary | ICD-10-CM | POA: Diagnosis not present

## 2024-04-29 DIAGNOSIS — N179 Acute kidney failure, unspecified: Secondary | ICD-10-CM | POA: Diagnosis not present

## 2024-04-29 DIAGNOSIS — I509 Heart failure, unspecified: Secondary | ICD-10-CM | POA: Diagnosis not present

## 2024-05-02 DIAGNOSIS — J9610 Chronic respiratory failure, unspecified whether with hypoxia or hypercapnia: Secondary | ICD-10-CM | POA: Diagnosis not present

## 2024-05-19 ENCOUNTER — Ambulatory Visit: Admitting: Family
